# Patient Record
Sex: Female | Born: 1965 | Race: White | Hispanic: No | Marital: Married | Smoking: Never smoker
Health system: Southern US, Community
[De-identification: ages and names within clinical notes are randomized; demographics above are authoritative.]

## PROBLEM LIST (undated history)

## (undated) DIAGNOSIS — N952 Postmenopausal atrophic vaginitis: Secondary | ICD-10-CM

## (undated) DIAGNOSIS — K5190 Ulcerative colitis, unspecified, without complications: Secondary | ICD-10-CM

## (undated) DIAGNOSIS — M7732 Calcaneal spur, left foot: Secondary | ICD-10-CM

## (undated) DIAGNOSIS — R079 Chest pain, unspecified: Secondary | ICD-10-CM

## (undated) DIAGNOSIS — C50919 Malignant neoplasm of unspecified site of unspecified female breast: Secondary | ICD-10-CM

## (undated) DIAGNOSIS — F411 Generalized anxiety disorder: Secondary | ICD-10-CM

## (undated) DIAGNOSIS — F329 Major depressive disorder, single episode, unspecified: Secondary | ICD-10-CM

## (undated) DIAGNOSIS — E7800 Pure hypercholesterolemia, unspecified: Secondary | ICD-10-CM

## (undated) DIAGNOSIS — G43909 Migraine, unspecified, not intractable, without status migrainosus: Secondary | ICD-10-CM

## (undated) DIAGNOSIS — F32A Depression, unspecified: Secondary | ICD-10-CM

## (undated) DIAGNOSIS — F341 Dysthymic disorder: Secondary | ICD-10-CM

## (undated) DIAGNOSIS — G4700 Insomnia, unspecified: Secondary | ICD-10-CM

## (undated) DIAGNOSIS — M8580 Other specified disorders of bone density and structure, unspecified site: Secondary | ICD-10-CM

## (undated) DIAGNOSIS — F419 Anxiety disorder, unspecified: Secondary | ICD-10-CM

## (undated) DIAGNOSIS — N941 Unspecified dyspareunia: Secondary | ICD-10-CM

## (undated) DIAGNOSIS — Z923 Personal history of irradiation: Secondary | ICD-10-CM

## (undated) DIAGNOSIS — M7660 Achilles tendinitis, unspecified leg: Secondary | ICD-10-CM

## (undated) DIAGNOSIS — I959 Hypotension, unspecified: Secondary | ICD-10-CM

## (undated) HISTORY — PX: SINUS EXPLORATION: SHX5214

## (undated) HISTORY — DX: Calcaneal spur, left foot: M77.32

## (undated) HISTORY — DX: Insomnia, unspecified: G47.00

## (undated) HISTORY — DX: Other specified disorders of bone density and structure, unspecified site: M85.80

## (undated) HISTORY — DX: Postmenopausal atrophic vaginitis: N95.2

## (undated) HISTORY — DX: Chest pain, unspecified: R07.9

## (undated) HISTORY — DX: Major depressive disorder, single episode, unspecified: F32.9

## (undated) HISTORY — DX: Hypotension, unspecified: I95.9

## (undated) HISTORY — DX: Pure hypercholesterolemia, unspecified: E78.00

## (undated) HISTORY — DX: Malignant neoplasm of unspecified site of unspecified female breast: C50.919

## (undated) HISTORY — DX: Ulcerative colitis, unspecified, without complications: K51.90

## (undated) HISTORY — DX: Depression, unspecified: F32.A

## (undated) HISTORY — DX: Unspecified dyspareunia: N94.10

## (undated) HISTORY — DX: Migraine, unspecified, not intractable, without status migrainosus: G43.909

## (undated) HISTORY — DX: Generalized anxiety disorder: F41.1

## (undated) HISTORY — DX: Anxiety disorder, unspecified: F41.9

## (undated) HISTORY — DX: Achilles tendinitis, unspecified leg: M76.60

## (undated) HISTORY — DX: Dysthymic disorder: F34.1

## (undated) HISTORY — DX: Personal history of irradiation: Z92.3

---

## 1989-07-24 HISTORY — PX: NASAL SINUS SURGERY: SHX719

## 2006-05-15 ENCOUNTER — Other Ambulatory Visit: Admission: RE | Admit: 2006-05-15 | Discharge: 2006-05-15 | Payer: Self-pay | Admitting: Family Medicine

## 2006-10-03 ENCOUNTER — Encounter: Admission: RE | Admit: 2006-10-03 | Discharge: 2006-10-03 | Payer: Self-pay | Admitting: Family Medicine

## 2007-10-21 ENCOUNTER — Emergency Department (HOSPITAL_COMMUNITY): Admission: EM | Admit: 2007-10-21 | Discharge: 2007-10-21 | Payer: Self-pay | Admitting: Emergency Medicine

## 2008-08-30 ENCOUNTER — Encounter: Admission: RE | Admit: 2008-08-30 | Discharge: 2008-08-30 | Payer: Self-pay | Admitting: Family Medicine

## 2011-04-20 ENCOUNTER — Other Ambulatory Visit: Payer: Self-pay | Admitting: Radiology

## 2011-04-20 ENCOUNTER — Other Ambulatory Visit (HOSPITAL_COMMUNITY)
Admission: RE | Admit: 2011-04-20 | Discharge: 2011-04-20 | Disposition: A | Discharge status: Home / Self Care | Payer: BC Managed Care – PPO | Source: Ambulatory Visit | Attending: Obstetrics and Gynecology | Admitting: Obstetrics and Gynecology

## 2011-04-20 ENCOUNTER — Other Ambulatory Visit: Payer: Self-pay | Admitting: Obstetrics and Gynecology

## 2011-04-20 DIAGNOSIS — C50919 Malignant neoplasm of unspecified site of unspecified female breast: Secondary | ICD-10-CM

## 2011-04-20 DIAGNOSIS — Z01419 Encounter for gynecological examination (general) (routine) without abnormal findings: Secondary | ICD-10-CM | POA: Insufficient documentation

## 2011-04-20 HISTORY — DX: Malignant neoplasm of unspecified site of unspecified female breast: C50.919

## 2011-04-21 ENCOUNTER — Other Ambulatory Visit: Payer: Self-pay | Admitting: Radiology

## 2011-04-21 DIAGNOSIS — C50919 Malignant neoplasm of unspecified site of unspecified female breast: Secondary | ICD-10-CM

## 2011-04-21 DIAGNOSIS — C50912 Malignant neoplasm of unspecified site of left female breast: Secondary | ICD-10-CM

## 2011-04-24 HISTORY — PX: MASTECTOMY PARTIAL / LUMPECTOMY W/ AXILLARY LYMPHADENECTOMY: SUR852

## 2011-04-25 ENCOUNTER — Ambulatory Visit
Admission: RE | Admit: 2011-04-25 | Discharge: 2011-04-25 | Disposition: A | Discharge status: Home / Self Care | Payer: BC Managed Care – PPO | Source: Ambulatory Visit | Attending: Radiology | Admitting: Radiology

## 2011-04-25 DIAGNOSIS — C50912 Malignant neoplasm of unspecified site of left female breast: Secondary | ICD-10-CM

## 2011-04-25 MED ORDER — GADOBENATE DIMEGLUMINE 529 MG/ML IV SOLN
13.0000 mL | Freq: Once | INTRAVENOUS | Status: AC | PRN
  Administered 2011-04-25: 13 mL via INTRAVENOUS

## 2011-04-26 ENCOUNTER — Other Ambulatory Visit: Payer: Self-pay | Admitting: Oncology

## 2011-04-26 ENCOUNTER — Encounter (INDEPENDENT_AMBULATORY_CARE_PROVIDER_SITE_OTHER): Payer: Self-pay | Admitting: General Surgery

## 2011-04-26 ENCOUNTER — Ambulatory Visit (HOSPITAL_BASED_OUTPATIENT_CLINIC_OR_DEPARTMENT_OTHER): Payer: BC Managed Care – PPO | Admitting: General Surgery

## 2011-04-26 ENCOUNTER — Encounter (HOSPITAL_BASED_OUTPATIENT_CLINIC_OR_DEPARTMENT_OTHER): Payer: BC Managed Care – PPO | Admitting: Oncology

## 2011-04-26 ENCOUNTER — Ambulatory Visit: Payer: BC Managed Care – PPO | Attending: General Surgery | Admitting: Physical Therapy

## 2011-04-26 VITALS — BP 111/75 | HR 83 | Temp 97.9°F | Resp 20 | Ht 64.2 in | Wt 140.2 lb

## 2011-04-26 DIAGNOSIS — IMO0001 Reserved for inherently not codable concepts without codable children: Secondary | ICD-10-CM | POA: Insufficient documentation

## 2011-04-26 DIAGNOSIS — C50919 Malignant neoplasm of unspecified site of unspecified female breast: Secondary | ICD-10-CM | POA: Insufficient documentation

## 2011-04-26 DIAGNOSIS — M25619 Stiffness of unspecified shoulder, not elsewhere classified: Secondary | ICD-10-CM | POA: Insufficient documentation

## 2011-04-26 DIAGNOSIS — C50419 Malignant neoplasm of upper-outer quadrant of unspecified female breast: Secondary | ICD-10-CM

## 2011-04-26 DIAGNOSIS — C50412 Malignant neoplasm of upper-outer quadrant of left female breast: Secondary | ICD-10-CM | POA: Insufficient documentation

## 2011-04-26 LAB — CBC WITH DIFFERENTIAL/PLATELET
BASO%: 0.6 % (ref 0.0–2.0)
EOS%: 1 % (ref 0.0–7.0)
HCT: 34.3 % — ABNORMAL LOW (ref 34.8–46.6)
LYMPH%: 33 % (ref 14.0–49.7)
MCH: 25.1 pg (ref 25.1–34.0)
MCHC: 32.5 g/dL (ref 31.5–36.0)
MONO#: 0.7 10*3/uL (ref 0.1–0.9)
NEUT%: 56.3 % (ref 38.4–76.8)
Platelets: 266 10*3/uL (ref 145–400)
RBC: 4.43 10*6/uL (ref 3.70–5.45)
WBC: 7.2 10*3/uL (ref 3.9–10.3)

## 2011-04-26 LAB — COMPREHENSIVE METABOLIC PANEL
ALT: 11 U/L (ref 0–35)
AST: 16 U/L (ref 0–37)
Alkaline Phosphatase: 40 U/L (ref 39–117)
CO2: 29 mEq/L (ref 19–32)
Creatinine, Ser: 0.55 mg/dL (ref 0.50–1.10)
Sodium: 138 mEq/L (ref 135–145)
Total Bilirubin: 0.2 mg/dL — ABNORMAL LOW (ref 0.3–1.2)
Total Protein: 7.3 g/dL (ref 6.0–8.3)

## 2011-04-26 NOTE — Progress Notes (Signed)
Subjective:   New diagnosis of breast cancer  Patient ID: Erin Castillo, female   DOB: [birth year], 44 y.o.   MRN: [id number]  HPI The patient is a 45 year old female seen in the breast multidisciplinary clinic for a new diagnosis of cancer of the left breast. She states that about 2 months ago she initially noted a slight puckering of the skin in the upper outer quadrant of her left breast. She initially did not think much of this more recently has also been able to feel a lump and presented for evaluation. She was evaluated at [facility]. I have personally reviewed her imaging and pathology reports. Mammogram revealed an approximately 2 cm spiculated mass in the upper outer quadrant of the left breast in the tail of Spence area. Ultrasound confirmed a hypoechoic 2.1 cm mass in the same area. Ultrasound-guided needle biopsy was performed. 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 She generally has felt well with no pain, nipple discharge, or other symptoms. She presents for treatment planning.  History reviewed. No pertinent past medical history. Past Surgical History  Procedure Date  . Nasal sinus surgery 1991  . Cesarean section    Current Outpatient Prescriptions  Medication Sig Dispense Refill  . citalopram (CELEXA) 20 MG tablet Take 20 mg by mouth daily.         No Known Allergies History  Substance Use Topics  . Smoking status: Never Smoker   . Smokeless tobacco: Not on file  . Alcohol Use: 0.6 oz/week    1 Glasses of wine per week    Review of Systems  Constitutional: Negative.   HENT: Negative.   Respiratory: Negative.   Cardiovascular: Negative.   Gastrointestinal: Negative.   Musculoskeletal: Negative.        Objective:   Physical Exam Gen.: Healthy-appearing Caucasian female in no distress Skin: Warm and dry without rash or infection HEENT: No palpable masses or thyromegaly. Sclera nonicteric. Oropharynx  clear. Lungs: Clear without wheezing or increased work of breathing Lymph nodes: No palpable cervical, supraclavicular, or axillary nodes palpable. Breasts: In the upper outer quadrant of the left breast in the tail of Mliss Sax is an approximately 2 cm moderately firm freely movable mass. There is no sensation of fixation to the chest wall. There is some slight puckering of the overlying skin. Cardiovascular: Regular rate and rhythm without murmur. No JVD or edema. Abdomen: Soft and nontender without mass Extremities: No joint swelling or deformity or edema Neurologic: Alert and fully oriented. Gait normal.    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 This is in the extreme upper outer quadrant in the tail of Spence. There is no fixation to the chest wall it appears to be separate from the chest wall on imaging. We discussed options of mastectomy and breast conservation and she clearly prefers breast conservation if appropriate. I believe we could proceed with initial lumpectomy. It does appear to be close to the skin and I would plan to have explained to the patient but would take an ellipse of skin overlying the tumor and then excised down to the chest wall. We will plan axillary sentinel lymph node biopsy as well. We discussed the indications and nature of the surgery and expected recovery and risks of bleeding, infection, possible need for further surgery based on final pathology, anesthetic complications, and rare risks of lymphedema.she will be  undergoing genetic testing. She will need radiation and probably hormonal treatment postoperatively.    Plan:     Left breast lumpectomy and left axillary sentinel lymph node biopsy under general anesthesia as an outpatient. We will want to wait until her genetic test results are back which will put her surgery after about October 20.

## 2011-04-26 NOTE — Patient Instructions (Signed)
Please call for any questions or concerns prior to her surgery. Please call my office when you have results of her genetic testing.

## 2011-04-27 ENCOUNTER — Other Ambulatory Visit (INDEPENDENT_AMBULATORY_CARE_PROVIDER_SITE_OTHER): Payer: Self-pay | Admitting: General Surgery

## 2011-04-27 DIAGNOSIS — C50912 Malignant neoplasm of unspecified site of left female breast: Secondary | ICD-10-CM

## 2011-05-15 ENCOUNTER — Ambulatory Visit (HOSPITAL_BASED_OUTPATIENT_CLINIC_OR_DEPARTMENT_OTHER)
Admission: RE | Admit: 2011-05-15 | Discharge: 2011-05-15 | Disposition: A | Discharge status: Home / Self Care | Payer: BC Managed Care – PPO | Source: Ambulatory Visit | Attending: General Surgery | Admitting: General Surgery

## 2011-05-15 ENCOUNTER — Other Ambulatory Visit (INDEPENDENT_AMBULATORY_CARE_PROVIDER_SITE_OTHER): Payer: Self-pay | Admitting: General Surgery

## 2011-05-15 ENCOUNTER — Ambulatory Visit (HOSPITAL_COMMUNITY)
Admission: RE | Admit: 2011-05-15 | Discharge: 2011-05-15 | Disposition: A | Discharge status: Home / Self Care | Payer: BC Managed Care – PPO | Source: Ambulatory Visit | Attending: General Surgery | Admitting: General Surgery

## 2011-05-15 DIAGNOSIS — C50419 Malignant neoplasm of upper-outer quadrant of unspecified female breast: Secondary | ICD-10-CM | POA: Insufficient documentation

## 2011-05-15 DIAGNOSIS — C50919 Malignant neoplasm of unspecified site of unspecified female breast: Secondary | ICD-10-CM | POA: Insufficient documentation

## 2011-05-15 DIAGNOSIS — Z01812 Encounter for preprocedural laboratory examination: Secondary | ICD-10-CM | POA: Insufficient documentation

## 2011-05-15 DIAGNOSIS — C50912 Malignant neoplasm of unspecified site of left female breast: Secondary | ICD-10-CM

## 2011-05-15 DIAGNOSIS — C773 Secondary and unspecified malignant neoplasm of axilla and upper limb lymph nodes: Secondary | ICD-10-CM | POA: Insufficient documentation

## 2011-05-15 HISTORY — PX: BREAST LUMPECTOMY: SHX2

## 2011-05-15 MED ORDER — TECHNETIUM TC 99M SULFUR COLLOID FILTERED
1.0000 | Freq: Once | INTRAVENOUS | Status: AC | PRN
  Administered 2011-05-15: 1 via INTRADERMAL

## 2011-05-17 ENCOUNTER — Telehealth (INDEPENDENT_AMBULATORY_CARE_PROVIDER_SITE_OTHER): Payer: Self-pay | Admitting: General Surgery

## 2011-05-17 NOTE — Op Note (Signed)
[name], [name] NO.:  [id number]  MEDICAL RECORD NO.:  [id number]  LOCATION:                                 FACILITY:  PHYSICIAN:  Sharlet Salina T. Marinda Tyer, M.D.DATE OF BIRTH:  04/29/1966  DATE OF PROCEDURE:  05/15/2011 DATE OF DISCHARGE:                              OPERATIVE REPORT   PREOPERATIVE DIAGNOSIS:  Cancer left breast.  POSTOPERATIVE DIAGNOSIS:  Cancer left breast.  SURGICAL PROCEDURES: 1. Blue dye injection left breast. 2. Left breast lumpectomy. 3. Left axillary sentinel lymph node biopsy.  SURGEON:  Lorne Skeens. Larson Limones, MD  ANESTHESIA:  Laryngeal mask general.  BRIEF HISTORY:  Erin Castillo is a 45 year old female who recently presented with a palpable mass in the extreme upper outer quadrant left breast in the tail of Spence.  Imaging including MRI and ultrasound has revealed an approximately 2.1 cm tumor close to, but not involving the skin or the pectoralis fascia.  Large core needle biopsies revealed invasive grade II ductal carcinoma.  We discussed initial surgical treatment options and I have elected to proceed with breast conservation with a lumpectomy and sentinel lymph node biopsy.  We discussed the nature of the procedure, its indications, risks of anesthetic complications, bleeding, infection, possible need for further surgery depending on final pathology.  She is now brought to the operating room for this procedure.  DESCRIPTION OF PROCEDURE:  Preoperatively, the patient underwent injection of 1 millicuries of technetium sulfur colloid intradermally around the left nipple.  She was then brought to the operating room and laryngeal mask general anesthesia was induced.  Under sterile technique, 10 mL of dilute methylene blue was injected subcutaneously beneath the left nipple and massaged.  The entire left breast, chest, axilla and upper arm were then widely sterilely prepped and draped.  Correct patient and procedure were  verified.  PAS were in place.  She received preoperative IV antibiotics.  The tumor again was close to, but not involving skin, although there was some slight puckering and was within a few millimeters of the pectoralis fascia.  As discussed and planned I made a curvilinear incision directly over the mass taking approximately 7 or 8 mm ellipse of skin directly overlying the most superficial area of the mass.  Dissection was then deepened down into the subcutaneous tissue and the wound edges retracted and dissection was deepened down around the mass in all directions through breast tissue with cautery, and this was taken down to the pectoralis fascia immediately beneath the mass which was taken up off the pectoralis with the specimen.  The palpable mass appeared to be well within the specimen grossly.  The specimen was inked for margins.  Specimen mammogram was obtained in the operating room, showing the clip in the center of the specimen.  This was sent for permanent pathology.  As this incision was high and lateral we were able to retract the skin laterally and exposed the low axilla and using the Neoprobe, a definite hot area in the axilla was identified.  With minimal dissection down into the axilla a bright blue lymph node with high counts was identified and was completely excised with cautery.  Ex  Vivo, the node had counts in excess of 1200 with background in the axilla of about 10.  This was sent as a hot blue left axillary sentinel lymph node.  Following this, all soft tissue was infiltrated with Marcaine with epinephrine.  I did mobilize the inferior breast tissue off the chest wall back about 5 cm with cautery to allow at least partial closure of the defect.  The cavity was marked with clips for radiation oncology.  The breast tissue was then closed with interrupted 3-0 Vicryl . At the center of the wound the breast tissue was brought up and sewn down to the pectoralis muscle  superiorly to smooth out and minimize the defect.  The subcu tissue was then closed with interrupted 3-0 Vicryl and skin with subcuticular 4-0 Monocryl, benzoin, and Steri-Strips.  Sponge, needle and instrument counts correct.  Dry dressing was applied.  The patient taken to recovery in good condition.     Lorne Skeens. Keirsten Matuska, M.D.     Tory Emerald  D:  05/15/2011  T:  05/15/2011  Job:  [id number]  Electronically Signed by Glenna Fellows M.D. on 05/17/2011 10:53:29 AM

## 2011-05-23 ENCOUNTER — Ambulatory Visit
Admission: RE | Admit: 2011-05-23 | Discharge: 2011-05-23 | Disposition: A | Discharge status: Home / Self Care | Payer: BC Managed Care – PPO | Source: Ambulatory Visit | Attending: Radiation Oncology | Admitting: Radiation Oncology

## 2011-05-23 DIAGNOSIS — C50919 Malignant neoplasm of unspecified site of unspecified female breast: Secondary | ICD-10-CM | POA: Insufficient documentation

## 2011-05-23 NOTE — Telephone Encounter (Signed)
Discussed path with pt

## 2011-05-25 ENCOUNTER — Ambulatory Visit (INDEPENDENT_AMBULATORY_CARE_PROVIDER_SITE_OTHER): Payer: BC Managed Care – PPO | Admitting: General Surgery

## 2011-05-25 ENCOUNTER — Encounter (INDEPENDENT_AMBULATORY_CARE_PROVIDER_SITE_OTHER): Payer: Self-pay | Admitting: General Surgery

## 2011-05-25 VITALS — BP 102/78 | HR 60 | Temp 98.2°F | Resp 20 | Ht 65.0 in | Wt 142.2 lb

## 2011-05-25 DIAGNOSIS — Z09 Encounter for follow-up examination after completed treatment for conditions other than malignant neoplasm: Secondary | ICD-10-CM

## 2011-05-25 NOTE — Progress Notes (Signed)
Patient returns for postop followup status post left breast lumpectomy and axillary sentinel lymph node biopsy. She is given along well with some expected soreness and fatigue.  On exam the lumpectomy site is healing very nicely as is the axillary incision without complication.  We reviewed her pathology which we had previously discussed by phone. She has negative margins on her lumpectomy. This was a 2.4 cm tumor. The closest margin was posterior and I did take the fascia over the muscle as this margin. Her sentinel lymph node however was positive for metastatic cancer with extracapsular extension.  Patient is doing well following lumpectomy and sentinel node biopsy. She has an appointment to see Dr. Darnelle Catalan on the 13th of this month. She may need a Port-A-Cath will be available and otherwise she will return in 4 months.

## 2011-05-29 ENCOUNTER — Encounter: Payer: Self-pay | Admitting: Oncology

## 2011-05-29 NOTE — Progress Notes (Unsigned)
Received Oncotype Dx results of 17.  Gave copy to MD.

## 2011-06-06 ENCOUNTER — Other Ambulatory Visit (HOSPITAL_BASED_OUTPATIENT_CLINIC_OR_DEPARTMENT_OTHER): Payer: BC Managed Care – PPO | Admitting: Lab

## 2011-06-06 ENCOUNTER — Encounter: Payer: Self-pay | Admitting: Oncology

## 2011-06-06 ENCOUNTER — Other Ambulatory Visit: Payer: Self-pay | Admitting: Oncology

## 2011-06-06 ENCOUNTER — Ambulatory Visit (HOSPITAL_BASED_OUTPATIENT_CLINIC_OR_DEPARTMENT_OTHER): Payer: BC Managed Care – PPO | Admitting: Oncology

## 2011-06-06 VITALS — BP 115/71 | HR 73 | Temp 98.0°F | Ht 65.0 in | Wt 147.4 lb

## 2011-06-06 DIAGNOSIS — C50419 Malignant neoplasm of upper-outer quadrant of unspecified female breast: Secondary | ICD-10-CM

## 2011-06-06 DIAGNOSIS — Z170 Estrogen receptor positive status [ER+]: Secondary | ICD-10-CM

## 2011-06-06 DIAGNOSIS — C50919 Malignant neoplasm of unspecified site of unspecified female breast: Secondary | ICD-10-CM

## 2011-06-06 LAB — CBC WITH DIFFERENTIAL/PLATELET
Basophils Absolute: 0 10*3/uL (ref 0.0–0.1)
EOS%: 2.3 % (ref 0.0–7.0)
Eosinophils Absolute: 0.2 10*3/uL (ref 0.0–0.5)
HCT: 33.5 % — ABNORMAL LOW (ref 34.8–46.6)
HGB: 10.8 g/dL — ABNORMAL LOW (ref 11.6–15.9)
LYMPH%: 28.9 % (ref 14.0–49.7)
MCH: 24.9 pg — ABNORMAL LOW (ref 25.1–34.0)
MCV: 77.4 fL — ABNORMAL LOW (ref 79.5–101.0)
MONO%: 9 % (ref 0.0–14.0)
NEUT#: 4.8 10*3/uL (ref 1.5–6.5)
NEUT%: 59.7 % (ref 38.4–76.8)
Platelets: 305 10*3/uL (ref 145–400)
RDW: 17.4 % — ABNORMAL HIGH (ref 11.2–14.5)

## 2011-06-06 NOTE — Progress Notes (Signed)
Erin Castillo is an 45 y.o. female.    Chief Complaint  Patient presents with  . Breast Cancer    HPI:  The patie after bilateral breast MRIs 04/25/2011 confirmed a 2.3 cm irregularly marginated enhancing mass deep in the upper outer quadrant of the left breast the patient proceeded to definitive left lumpectomy and sentinel lymph node biopsy 05/15/2011. The final pathology (as is he a [id number]) and nt noted a slight dimple in her lateral left breast sometime in September of 2012 and was able to palpate a mass in that area. She brought this to the attention of Dr. Dion Castillo, and he set the patient up for bilateral diagnostic mammography at [facility] 04/20/2011. Compression views and standard CC and MLO views confirmed a spiculated mass in the upper outer quadrant of the left breast measuring 2.1 cm. By ultrasound this was lobulated irregular and hypoechoic. The axilla it revealed no abnormal appearing lymph nodes. Bx was performed the same day and showed (SA a [id number]) and invasive ductal carcinoma described as grade 2, estrogen receptor 98% and progesterone receptor 100% positive, with an MIB-1-1 of 9% and no HDR 2 amplification  With this information the patient was referred to Dr. Johna Castillo and on 05/15/2011 underwent left lumpectomy and sentinel lymph node sampling. The final pathology (SZ a [id number]) showed a 2.4 cm invasive ductal carcinoma, grade 2, with negative and adequate margins, no evidence of lymphovascular invasion, but one out of one sentinel lymph nodes involved. With this information the patient now presents for discussion of adjuvant treatment.  Past Medical History  Diagnosis Date  . Cancer     breast   . Migraines   . Anxiety     Past Surgical History  Procedure Date  . Nasal sinus surgery 1991  . Cesarean section 2000  . Breast lumpectomy 05/15/11    left breast lumpectomy   . Sinus exploration     remote    Family History  Problem Relation Age of Onset  . Cancer Father    the patient's father has a history of bladder cancer but has done well and is alive at age 56 the patient's mother is alive at age 58 the patient has 3 sisters no brothers there is no history of breast or ovarian cancer in the family.  Gynecologic history:  Menarche age 1 regular periods last about 4 days at which about 2 or heavy. 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 Her friend Erin Castillo who actually had to the board for the [employer] is present today. The patient is separated from her husband, Erin Castillo, who works at [facility]. They are not officially divorced however. Her children are Erin Castillo. [address] [place] age 56 and Erin Castillo age 44. The patient attends [facility]  She  reports that she has never smoked. She has never used smokeless tobacco. She reports that she drinks perhaps 2 glasses of wine a week.. She reports that she does not use illicit drugs.  Health maintenance:  Cholesterol "good"  Bone density never  Colonoscopy approximately 10 years ago in [place]  (PAP) September of 2012   Allergies: No Known Allergies  Medications Prior to Admission  Medication Sig Dispense Refill  . citalopram (CELEXA) 20 MG tablet Take 20 mg by mouth daily.         No current facility-administered medications on file as of 06/06/2011.  ROS  she did well with her surgery with minimal pain no bleeding no swelling no erythema no fever. Currently a detailed review of systems is entirely noncontributory  Physical Exam:  Blood pressure 115/71, pulse 73, temperature 98 F (36.7 Castillo), height 5\' 5"  (1.651 m), weight 147 lb 6.4 oz (66.86 kg).  Sclerae unicteric Oropharynx clear No peripheral adenopathy Lungs no rales or rhonchi Heart regular rate and rhythm Abd benign MSK no focal spinal tenderness, no peripheral edema Neuro: nonfocal Breasts: Right breast no suspicious  findings left breast status post lumpectomy excellent cosmetic result no evidence of dehiscence inflammation swelling or erythema  CBC Lab Results  Component Value Date   HGB 10.8* 06/06/2011   HCT 33.5* 06/06/2011   MCV 77.4* 06/06/2011   PLT 305 06/06/2011   CMP     Component Value Date/Time   NA 138 04/26/2011 1219   K 3.6 04/26/2011 1219   CL 101 04/26/2011 1219   CO2 29 04/26/2011 1219   GLUCOSE 107* 04/26/2011 1219   BUN 12 04/26/2011 1219   CREATININE 0.55 04/26/2011 1219   CALCIUM 9.2 04/26/2011 1219   PROT 7.3 04/26/2011 1219   ALBUMIN 3.9 04/26/2011 1219   AST 16 04/26/2011 1219   ALT 11 04/26/2011 1219   ALKPHOS 40 04/26/2011 1219   BILITOT 0.2* 04/26/2011 1219     No new results found.   Assessment: 45 year old [place] woman status post left lumpectomy and sentinel lymph node biopsy October of 2012 for a 2.4 cm invasive ductal carcinoma, grade 2, involving the single sentinel lymph node, and so T2 N1 or stage IIb. The tumor was grade 2, strongly estrogen and progesterone receptor positive, with an MIB-1-1 of 9% and no HDR to amplification.  Plan:  I quoted her a risk of recurrence in the 57% range based on the adjuvant! Program. She can decrease this by 40% by taking standard anti-estrogen and chemotherapy treatments. After much discussion we decided we would go with TAC (cyclophosphamide doxorubicin and docetaxel) given every 3 weeks via port and that we would start treatment on November 29. We discussed side effects toxicities and complications of treatment and she will also come to "chemotherapy is cool" for further discussion  I have requested a return appointment with Dr. Johna Castillo for port placement, and she will also be scheduled for a chest x-ray and an echocardiogram. She'll see [place] again on November 26 and at that visit we will review results of these studies and also right of her prescriptions for antinausea and other prophylactic medicines and give her written  instructions on how to take them at this point it Erin Castillo is considering continuing to work but she may choose to go on temporary disability  Erin Castillo,Erin Castillo 06/06/2011, 6:07 PM

## 2011-06-08 ENCOUNTER — Other Ambulatory Visit (INDEPENDENT_AMBULATORY_CARE_PROVIDER_SITE_OTHER): Payer: Self-pay | Admitting: General Surgery

## 2011-06-08 ENCOUNTER — Encounter (HOSPITAL_BASED_OUTPATIENT_CLINIC_OR_DEPARTMENT_OTHER): Payer: Self-pay | Admitting: *Deleted

## 2011-06-08 NOTE — Progress Notes (Signed)
Labs per anesthesia Does not meet criteria for ekg or cxr Will get cxr post pac insertion All preop teaching reviewed

## 2011-06-12 ENCOUNTER — Ambulatory Visit (HOSPITAL_COMMUNITY): Payer: BC Managed Care – PPO

## 2011-06-12 ENCOUNTER — Encounter (HOSPITAL_BASED_OUTPATIENT_CLINIC_OR_DEPARTMENT_OTHER): Payer: Self-pay | Admitting: Certified Registered"

## 2011-06-12 ENCOUNTER — Ambulatory Visit (HOSPITAL_BASED_OUTPATIENT_CLINIC_OR_DEPARTMENT_OTHER)
Admission: RE | Admit: 2011-06-12 | Discharge: 2011-06-12 | Disposition: A | Discharge status: Home / Self Care | Payer: BC Managed Care – PPO | Source: Ambulatory Visit | Attending: General Surgery | Admitting: General Surgery

## 2011-06-12 ENCOUNTER — Encounter (HOSPITAL_BASED_OUTPATIENT_CLINIC_OR_DEPARTMENT_OTHER)
Admission: RE | Disposition: A | Discharge status: Home / Self Care | Payer: Self-pay | Source: Ambulatory Visit | Attending: General Surgery

## 2011-06-12 ENCOUNTER — Encounter (HOSPITAL_BASED_OUTPATIENT_CLINIC_OR_DEPARTMENT_OTHER): Payer: Self-pay

## 2011-06-12 ENCOUNTER — Ambulatory Visit (HOSPITAL_BASED_OUTPATIENT_CLINIC_OR_DEPARTMENT_OTHER): Payer: BC Managed Care – PPO | Admitting: Certified Registered"

## 2011-06-12 DIAGNOSIS — Z01812 Encounter for preprocedural laboratory examination: Secondary | ICD-10-CM | POA: Insufficient documentation

## 2011-06-12 DIAGNOSIS — C50919 Malignant neoplasm of unspecified site of unspecified female breast: Secondary | ICD-10-CM | POA: Insufficient documentation

## 2011-06-12 DIAGNOSIS — C50419 Malignant neoplasm of upper-outer quadrant of unspecified female breast: Secondary | ICD-10-CM

## 2011-06-12 HISTORY — PX: PORTACATH PLACEMENT: SHX2246

## 2011-06-12 SURGERY — INSERTION, TUNNELED CENTRAL VENOUS DEVICE, WITH PORT
Anesthesia: Monitor Anesthesia Care | Site: Chest | Laterality: Right | Wound class: Clean

## 2011-06-12 MED ORDER — ONDANSETRON HCL 4 MG/2ML IJ SOLN
INTRAMUSCULAR | Status: DC | PRN
  Administered 2011-06-12: 4 mg via INTRAVENOUS

## 2011-06-12 MED ORDER — HEPARIN (PORCINE) IN NACL 2-0.9 UNIT/ML-% IJ SOLN
INTRAMUSCULAR | Status: DC | PRN
  Administered 2011-06-12: 50 mL via INTRAVENOUS

## 2011-06-12 MED ORDER — HYDROMORPHONE HCL PF 1 MG/ML IJ SOLN: 0.2500 mg | INTRAMUSCULAR | Status: DC | PRN

## 2011-06-12 MED ORDER — HEPARIN SOD (PORK) LOCK FLUSH 100 UNIT/ML IV SOLN
INTRAVENOUS | Status: DC | PRN
  Administered 2011-06-12: 500 [IU] via INTRAVENOUS

## 2011-06-12 MED ORDER — FENTANYL CITRATE 0.05 MG/ML IJ SOLN
INTRAMUSCULAR | Status: DC | PRN
  Administered 2011-06-12: 100 ug via INTRAVENOUS

## 2011-06-12 MED ORDER — SODIUM BICARBONATE 4 % IV SOLN
INTRAVENOUS | Status: DC | PRN
  Administered 2011-06-12: 2 mL via INTRAVENOUS

## 2011-06-12 MED ORDER — LACTATED RINGERS IV SOLN
INTRAVENOUS | Status: DC
  Administered 2011-06-12 (×2): via INTRAVENOUS

## 2011-06-12 MED ORDER — LIDOCAINE HCL (CARDIAC) 20 MG/ML IV SOLN
INTRAVENOUS | Status: DC | PRN
  Administered 2011-06-12: 20 mg via INTRAVENOUS

## 2011-06-12 MED ORDER — PROMETHAZINE HCL 25 MG/ML IJ SOLN: 6.2500 mg | INTRAMUSCULAR | Status: DC | PRN

## 2011-06-12 MED ORDER — MIDAZOLAM HCL 5 MG/5ML IJ SOLN
INTRAMUSCULAR | Status: DC | PRN
  Administered 2011-06-12: 2 mg via INTRAVENOUS
  Administered 2011-06-12: 1 mg via INTRAVENOUS
  Administered 2011-06-12: 0.5 mg via INTRAVENOUS

## 2011-06-12 MED ORDER — CEFAZOLIN SODIUM 1-5 GM-% IV SOLN
1.0000 g | INTRAVENOUS | Status: AC
  Administered 2011-06-12: 1 g via INTRAVENOUS

## 2011-06-12 MED ORDER — BUPIVACAINE-EPINEPHRINE 0.25% -1:200000 IJ SOLN
INTRAMUSCULAR | Status: DC | PRN
  Administered 2011-06-12: 10 mL

## 2011-06-12 MED ORDER — PROPOFOL 10 MG/ML IV EMUL
INTRAVENOUS | Status: DC | PRN
  Administered 2011-06-12: 100 ug/kg/min via INTRAVENOUS

## 2011-06-12 MED ORDER — LIDOCAINE HCL (PF) 1 % IJ SOLN
INTRAMUSCULAR | Status: DC | PRN
  Administered 2011-06-12: 10 mL

## 2011-06-12 SURGICAL SUPPLY — 46 items
ADH SKN CLS APL DERMABOND .7 (GAUZE/BANDAGES/DRESSINGS) ×1
BAG DECANTER FOR FLEXI CONT (MISCELLANEOUS) ×2 IMPLANT
BANDAGE ADHESIVE 1X3 (GAUZE/BANDAGES/DRESSINGS) ×1 IMPLANT
BLADE SURG 15 STRL LF DISP TIS (BLADE) ×1 IMPLANT
BLADE SURG 15 STRL SS (BLADE) ×2
CHLORAPREP W/TINT 26ML (MISCELLANEOUS) ×2 IMPLANT
CLOTH BEACON ORANGE TIMEOUT ST (SAFETY) ×2 IMPLANT
COVER MAYO STAND STRL (DRAPES) ×2 IMPLANT
COVER TABLE BACK 60X90 (DRAPES) ×2 IMPLANT
DECANTER SPIKE VIAL GLASS SM (MISCELLANEOUS) IMPLANT
DERMABOND ADVANCED (GAUZE/BANDAGES/DRESSINGS) ×1
DERMABOND ADVANCED .7 DNX12 (GAUZE/BANDAGES/DRESSINGS) ×1 IMPLANT
DRAPE C-ARM 42X72 X-RAY (DRAPES) ×2 IMPLANT
DRAPE LAPAROTOMY T 102X78X121 (DRAPES) ×2 IMPLANT
DRAPE UTILITY XL STRL (DRAPES) ×2 IMPLANT
ELECT REM PT RETURN 9FT ADLT (ELECTROSURGICAL) ×2
ELECTRODE REM PT RTRN 9FT ADLT (ELECTROSURGICAL) ×1 IMPLANT
GLOVE BIO SURGEON STRL SZ 6.5 (GLOVE) ×1 IMPLANT
GLOVE BIOGEL PI IND STRL 7.0 (GLOVE) IMPLANT
GLOVE BIOGEL PI IND STRL 8 (GLOVE) ×1 IMPLANT
GLOVE BIOGEL PI INDICATOR 7.0 (GLOVE) ×1
GLOVE BIOGEL PI INDICATOR 8 (GLOVE) ×1
GLOVE SS BIOGEL STRL SZ 7.5 (GLOVE) ×1 IMPLANT
GLOVE SUPERSENSE BIOGEL SZ 7.5 (GLOVE) ×1
GOWN PREVENTION PLUS XLARGE (GOWN DISPOSABLE) ×2 IMPLANT
IV CATH PLACEMENT UNIT 16 GA (IV SOLUTION) IMPLANT
IV HEPARIN 1000UNITS/500ML (IV SOLUTION) ×2 IMPLANT
IV KIT MINILOC 20X1 SAFETY (NEEDLE) IMPLANT
KIT BARDPORT ISP (Port) IMPLANT
KIT POWER CATH 8FR (Catheter) ×1 IMPLANT
NDL HYPO 25X1 1.5 SAFETY (NEEDLE) ×1 IMPLANT
NEEDLE HYPO 22GX1.5 SAFETY (NEEDLE) ×2 IMPLANT
NEEDLE HYPO 25X1 1.5 SAFETY (NEEDLE) ×2 IMPLANT
PACK BASIN DAY SURGERY FS (CUSTOM PROCEDURE TRAY) ×2 IMPLANT
PENCIL BUTTON HOLSTER BLD 10FT (ELECTRODE) ×2 IMPLANT
SET SHEATH INTRODUCER 10FR (MISCELLANEOUS) IMPLANT
SHEATH COOK PEEL AWAY SET 9F (SHEATH) IMPLANT
SLEEVE SCD COMPRESS KNEE MED (MISCELLANEOUS) ×1 IMPLANT
SUT MON AB 4-0 PC3 18 (SUTURE) ×2 IMPLANT
SUT PROLENE 2 0 CT2 30 (SUTURE) ×2 IMPLANT
SUT SILK 4 0 TIES 17X18 (SUTURE) IMPLANT
SYR 5ML LUER SLIP (SYRINGE) ×1 IMPLANT
SYR CONTROL 10ML LL (SYRINGE) ×2 IMPLANT
TOWEL OR 17X24 6PK STRL BLUE (TOWEL DISPOSABLE) ×14 IMPLANT
TOWEL OR NON WOVEN STRL DISP B (DISPOSABLE) ×2 IMPLANT
WATER STERILE IRR 1000ML POUR (IV SOLUTION) ×1 IMPLANT

## 2011-06-12 NOTE — Anesthesia Preprocedure Evaluation (Addendum)
Anesthesia Evaluation  Patient identified by MRN, date of birth, ID band Patient awake    Airway Mallampati: I TM Distance: >3 FB Neck ROM: Full    Dental No notable dental hx. (+) Teeth Intact   Pulmonary neg pulmonary ROS,  clear to auscultation  Pulmonary exam normal       Cardiovascular neg cardio ROS Regular Normal    Neuro/Psych    GI/Hepatic negative GI ROS, Neg liver ROS,   Endo/Other  Negative Endocrine ROS  Renal/GU negative Renal ROS     Musculoskeletal negative musculoskeletal ROS (+)   Abdominal   Peds  Hematology   Anesthesia Other Findings   Reproductive/Obstetrics negative OB ROS                          Anesthesia Physical Anesthesia Plan  ASA: III  Anesthesia Plan: MAC   Post-op Pain Management:    Induction: Intravenous  Airway Management Planned: Simple Face Mask  Additional Equipment:   Intra-op Plan:   Post-operative Plan:   Informed Consent: I have reviewed the patients History and Physical, chart, labs and discussed the procedure including the risks, benefits and alternatives for the proposed anesthesia with the patient or authorized representative who has indicated his/her understanding and acceptance.   Dental advisory given  Plan Discussed with: CRNA and Surgeon  Anesthesia Plan Comments: (Plan routine monitors, MAC)        Anesthesia Quick Evaluation

## 2011-06-12 NOTE — Anesthesia Postprocedure Evaluation (Signed)
  Anesthesia Post-op Note  Patient: Erin Castillo  Procedure(s) Performed:  INSERTION PORT-A-CATH - Right Subclavian Vein  Patient Location: PACU  Anesthesia Type: MAC  Level of Consciousness: awake and alert   Airway and Oxygen Therapy: Patient Spontanous Breathing  Post-op Pain: none  Post-op Assessment: Post-op Vital signs reviewed, Patient's Cardiovascular Status Stable, Respiratory Function Stable, No signs of Nausea or vomiting, Adequate PO intake and Pain level controlled  Post-op Vital Signs: Reviewed and stable  Complications: No apparent anesthesia complications

## 2011-06-12 NOTE — Transfer of Care (Signed)
Immediate Anesthesia Transfer of Care Note  Patient: Erin Castillo  Procedure(s) Performed:  INSERTION PORT-A-CATH - Right Subclavian Vein  Patient Location: PACU  Anesthesia Type: MAC  Level of Consciousness: awake, alert , oriented and patient cooperative  Airway & Oxygen Therapy: Patient Spontanous Breathing  Post-op Assessment: Report given to PACU RN and Post -op Vital signs reviewed and stable  Post vital signs: Reviewed and stable  Complications: No apparent anesthesia complications

## 2011-06-12 NOTE — H&P (Signed)
  Subjective: For Portacath    Erin Castillo is a 45 y.o. female who presents for portacath placement, hx of CA breast needing chemorx    Objective:   BP 99/62  Pulse 76  Temp(Src) 98.2 F (36.8 C) (Oral)  Resp 20  SpO2 100%  General:  alert, cooperative and no distress Skin:  normal Eyes: negative, conjunctivae/corneas clear. PERRL, EOM's intact. Fundi benign. Mouth: MMM no lesions Lymph Nodes:  Cervical, supraclavicular, and axillary nodes normal. Lungs:  clear to auscultation bilaterally Heart:  regular rate and rhythm, S1, S2 normal, no murmur, click, rub or gallop Abdomen: soft, non-tender; bowel sounds normal; no masses,  no organomegaly  Extremities:  extremities normal, atraumatic, no cyanosis or edema Neurologic:  Alert and oriented x3. Gait normal.Psychiatric:  normal mood, behavior, speech, dress, and thought processes    Assessment:For portacath    Plan:   1. Discussed the risk of surgery,  and the risks of general anesthetic, bleeding, infection, PTX, throbosis and catheter displacement. The patient understands the risks, any and all questions were answered to the patient's satisfaction.   Date of Surgery Update (To be completed by Attending Surgeon day of surgery.)

## 2011-06-12 NOTE — Anesthesia Procedure Notes (Addendum)
Performed by: Radford Pax   Procedure Name: MAC Date/Time: 06/12/2011 10:14 AM Performed by: Radford Pax Pre-anesthesia Checklist: Patient identified, Timeout performed, Emergency Drugs available, Suction available and Patient being monitored Patient Re-evaluated:Patient Re-evaluated prior to inductionOxygen Delivery Method: Simple face mask Intubation Type: IV induction

## 2011-06-12 NOTE — Op Note (Signed)
Preoperative diagnosis: Cancer of the breast and the poor venous access  Postoperative diagnosis: Same  Procedure: Placement of Powerport subcutaneous venous port  Surgeon: Glenna Fellows M.D.  Anesthesia: Local with IV sedation  Description of procedure: Patient is brought to the operating room and placed in the supine position on the operating table. IV sedation was administered. The entire upper chest and neck were widely sterilely prepped and draped. Local anesthesia was used to infiltrate the insertion of port site. The right subclavian vein was cannulated with a needle and guidewire without difficulty and position in the superior vena cava was confirmed by fluoroscopy. The introducer was then placed over the guidewire and the flush catheter placed via the introducer which was stripped away and the tip of the catheter positioned near the cavoatrial junction. A small transverse incision was made in the anterior chest wall and subcutaneous pocket created. The catheter was tunneled into the pocket, trimmed to length, and attached to the flushed port which was positioned in the pocket. The port was sutured to the chest wall with interrupted 2-0 Prolene. The incisions were closed with subcutaneous interrupted Monocryl and the skin incisions closed with subcuticular Monocryl and Dermabond. The port was accessed and flushed and aspirated easily and was left flushed with concentrated heparin solution. Sponge needle as the counts were correct. The patient was taken to recovery in good condition.  Mariella Saa MD, FACS  06/12/2011, 11:15 AM

## 2011-06-14 ENCOUNTER — Encounter (HOSPITAL_BASED_OUTPATIENT_CLINIC_OR_DEPARTMENT_OTHER): Payer: Self-pay | Admitting: General Surgery

## 2011-06-15 ENCOUNTER — Encounter (HOSPITAL_BASED_OUTPATIENT_CLINIC_OR_DEPARTMENT_OTHER): Payer: Self-pay | Admitting: Anesthesiology

## 2011-06-19 ENCOUNTER — Ambulatory Visit (HOSPITAL_COMMUNITY): Admission: RE | Admit: 2011-06-19 | Payer: BC Managed Care – PPO | Source: Ambulatory Visit | Admitting: General Surgery

## 2011-06-19 ENCOUNTER — Ambulatory Visit (HOSPITAL_BASED_OUTPATIENT_CLINIC_OR_DEPARTMENT_OTHER): Payer: BC Managed Care – PPO | Admitting: Physician Assistant

## 2011-06-19 ENCOUNTER — Encounter (HOSPITAL_COMMUNITY): Admission: RE | Payer: Self-pay | Source: Ambulatory Visit

## 2011-06-19 ENCOUNTER — Encounter: Payer: Self-pay | Admitting: Physician Assistant

## 2011-06-19 ENCOUNTER — Telehealth: Payer: Self-pay | Admitting: Oncology

## 2011-06-19 VITALS — BP 116/80 | HR 79 | Temp 98.5°F | Wt 148.0 lb

## 2011-06-19 DIAGNOSIS — C50419 Malignant neoplasm of upper-outer quadrant of unspecified female breast: Secondary | ICD-10-CM

## 2011-06-19 DIAGNOSIS — D649 Anemia, unspecified: Secondary | ICD-10-CM

## 2011-06-19 DIAGNOSIS — Z170 Estrogen receptor positive status [ER+]: Secondary | ICD-10-CM

## 2011-06-19 DIAGNOSIS — C50919 Malignant neoplasm of unspecified site of unspecified female breast: Secondary | ICD-10-CM

## 2011-06-19 SURGERY — INSERTION, TUNNELED CENTRAL VENOUS DEVICE, WITH PORT
Anesthesia: Monitor Anesthesia Care

## 2011-06-19 MED ORDER — DEXAMETHASONE 4 MG PO TABS: ORAL_TABLET | ORAL | Status: DC

## 2011-06-19 MED ORDER — LIDOCAINE-PRILOCAINE 2.5-2.5 % EX CREA: TOPICAL_CREAM | CUTANEOUS | Status: DC | PRN

## 2011-06-19 MED ORDER — LORAZEPAM 0.5 MG PO TABS: ORAL_TABLET | ORAL | Status: DC

## 2011-06-19 MED ORDER — PROCHLORPERAZINE MALEATE 10 MG PO TABS: ORAL_TABLET | ORAL | Status: DC

## 2011-06-19 NOTE — Progress Notes (Signed)
Hematology and Oncology Follow Up Visit  Erin Castillo [id number] Nov 05, 1965 45 y.o. 06/19/2011 9:30 PM   Interim History:   Patient returns today accompanied by a friend for followup of her left breast cancer.  She is ready to initiate adjuvant chemotherapy later this week, the plan being to treat with 6 cycles of q. 3 week docetaxel, doxorubicin, and cyclophosphamide, with Neulasta given on day 2 for granulocyte support.  Her first chemo is scheduled for Thursday, 11/29.  We spent approximately 60 minutes together today, over half of which was spent reviewing her treatment plan, the possible side effects, and her antinausea regimen.    She is also being scheduled for chemo school tomorrow morning, and is scheduled for her echocardiogram on Wednesday before chemo on Thursday.  Her port was put in on 11/19 with no complications and a normal chest xray.  Erin Castillo is a little nervous about beginning chemo, but a detailed review of systems is otherwise noncontributory as noted below.  Review of Systems: Constitutional:  no weight loss, fever, night sweats and feels well Eyes: negative [id number]:[id number] Cardiovascular: no chest pain or dyspnea on exertion Respiratory: no cough, shortness of breath, or wheezing Neurological: negative Dermatological: negative Gastrointestinal: no abdominal pain, change in bowel habits, or black or bloody stools Genito-Urinary: no dysuria, trouble voiding, or hematuria Hematological and Lymphatic: negative Breast: negative Musculoskeletal: negative Remaining ROS negative.  Medications: I have reviewed the patient's current medications.  Allergies: No Known Allergies   Physical Exam:  Blood pressure 116/80, pulse 79, temperature 98.5 F (36.9 C), temperature source Oral, weight 148 lb (67.132 kg). HEENT:  Sclerae anicteric, conjunctivae pink.  Oropharynx clear.  No mucositis or candidiasis.  Nodes:  No cervical, supraclavicular, or axillary lymphadenopathy palpated.   Breast Exam:  Deferred.  Lungs:  Clear to auscultation bilaterally.  No crackles, rhonchi, or wheezes.  Heart:  Regular rate and rhythm.  Abdomen:  Soft, nontender.  Positive bowel sounds.  No organomegaly or masses palpated.  Musculoskeletal:  No focal spinal tenderness to palpation.  Extremities:  Benign.  No peripheral edema or cyanosis.  Skin:  Benign.  Neuro:  Nonfocal.   Lab Results: Lab Results  Component Value Date   WBC 8.0 06/06/2011   HGB 11.7* 06/12/2011   HCT 33.5* 06/06/2011   MCV 77.4* 06/06/2011   PLT 305 06/06/2011   NEUTROABS 4.8 06/06/2011     Chemistry      Component Value Date/Time   NA 138 04/26/2011 1219   K 3.6 04/26/2011 1219   CL 101 04/26/2011 1219   CO2 29 04/26/2011 1219   BUN 12 04/26/2011 1219   CREATININE 0.55 04/26/2011 1219      Component Value Date/Time   CALCIUM 9.2 04/26/2011 1219   ALKPHOS 40 04/26/2011 1219   AST 16 04/26/2011 1219   ALT 11 04/26/2011 1219   BILITOT 0.2* 04/26/2011 1219       Radiological Studies: Chest xray on 11/19 was unremarkable.  An echo is pending, scheduled for 06/21/11.  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 The tumor was grade 2, strongly estrogen and progesterone receptor positive, with an MIB-1 of 9% and no HER-2/neu amplification.  Due to initiate adjuvant chemotherapy on 06/22/11, to consist of 6 q 3 week cycles of docetaxel, doxorubicin, and cyclophosphamide, with Neulasta  on day 2 for granulocyte support.  Patient also has asymptomatic anemia, possibly an iron deficiency which we will follow.  Erin Castillo will return on Thursday, 11/29, for her first cycle of TAC.  She was given prescriptions for dexamethasone (which she will begin the day before chemo), prochlorperazine, lorazepam, and EMLA cream, along with written  instructions on how to use all of the medications appropriately.  We reviewed these instructions and she voices understanding. She will attend "Chemo School" tomorrow and will have her echo on Wednesday before her first chemo on Thursday.  Of course, she is also scheduled for Neulasta on Friday and will see me next week on 06/29/11 for assessment of chemotoxicity.   This plan was reviewed with the patient, who voices understanding and agreement.  She knows to call with any changes or problems.    Erin Sasso, PA-C 11/26/20129:30 PM

## 2011-06-19 NOTE — Telephone Encounter (Signed)
Gv pt appt for nov-jan2013 °

## 2011-06-20 ENCOUNTER — Encounter: Payer: Self-pay | Admitting: *Deleted

## 2011-06-20 ENCOUNTER — Other Ambulatory Visit: Payer: BC Managed Care – PPO

## 2011-06-21 ENCOUNTER — Ambulatory Visit (HOSPITAL_COMMUNITY)
Admission: RE | Admit: 2011-06-21 | Discharge: 2011-06-21 | Disposition: A | Discharge status: Home / Self Care | Payer: BC Managed Care – PPO | Source: Ambulatory Visit | Attending: Oncology | Admitting: Oncology

## 2011-06-21 DIAGNOSIS — C50919 Malignant neoplasm of unspecified site of unspecified female breast: Secondary | ICD-10-CM | POA: Insufficient documentation

## 2011-06-21 DIAGNOSIS — Z5111 Encounter for antineoplastic chemotherapy: Secondary | ICD-10-CM

## 2011-06-21 NOTE — Progress Notes (Signed)
  Echocardiogram 2D Echocardiogram has been performed.  Dewitt Hoes, RDCS 06/21/2011, 11:28 AM

## 2011-06-22 ENCOUNTER — Other Ambulatory Visit (HOSPITAL_BASED_OUTPATIENT_CLINIC_OR_DEPARTMENT_OTHER): Payer: BC Managed Care – PPO

## 2011-06-22 ENCOUNTER — Ambulatory Visit (HOSPITAL_BASED_OUTPATIENT_CLINIC_OR_DEPARTMENT_OTHER): Payer: BC Managed Care – PPO

## 2011-06-22 VITALS — BP 117/82 | HR 91 | Temp 98.0°F

## 2011-06-22 DIAGNOSIS — Z5111 Encounter for antineoplastic chemotherapy: Secondary | ICD-10-CM

## 2011-06-22 DIAGNOSIS — C50419 Malignant neoplasm of upper-outer quadrant of unspecified female breast: Secondary | ICD-10-CM

## 2011-06-22 DIAGNOSIS — C50919 Malignant neoplasm of unspecified site of unspecified female breast: Secondary | ICD-10-CM

## 2011-06-22 LAB — CBC WITH DIFFERENTIAL/PLATELET
BASO%: 0.2 % (ref 0.0–2.0)
EOS%: 0 % (ref 0.0–7.0)
MCH: 24.5 pg — ABNORMAL LOW (ref 25.1–34.0)
MCHC: 32.3 g/dL (ref 31.5–36.0)
MONO#: 0.4 10*3/uL (ref 0.1–0.9)
NEUT%: 90.5 % — ABNORMAL HIGH (ref 38.4–76.8)
RBC: 4.45 10*6/uL (ref 3.70–5.45)
RDW: 16.9 % — ABNORMAL HIGH (ref 11.2–14.5)
WBC: 10.9 10*3/uL — ABNORMAL HIGH (ref 3.9–10.3)
lymph#: 0.6 10*3/uL — ABNORMAL LOW (ref 0.9–3.3)
nRBC: 0 % (ref 0–0)

## 2011-06-22 MED ORDER — SODIUM CHLORIDE 0.9 % IV SOLN
150.0000 mg | Freq: Once | INTRAVENOUS | Status: AC
  Administered 2011-06-22: 150 mg via INTRAVENOUS
  Filled 2011-06-22: qty 5

## 2011-06-22 MED ORDER — HEPARIN SOD (PORK) LOCK FLUSH 100 UNIT/ML IV SOLN
500.0000 [IU] | Freq: Once | INTRAVENOUS | Status: DC | PRN
  Filled 2011-06-22: qty 5

## 2011-06-22 MED ORDER — DOCETAXEL CHEMO INJECTION 160 MG/16ML
75.0000 mg/m2 | Freq: Once | INTRAVENOUS | Status: AC
  Administered 2011-06-22: 130 mg via INTRAVENOUS
  Filled 2011-06-22: qty 13

## 2011-06-22 MED ORDER — SODIUM CHLORIDE 0.9 % IV SOLN
Freq: Once | INTRAVENOUS | Status: AC
  Administered 2011-06-22: 13:00:00 via INTRAVENOUS

## 2011-06-22 MED ORDER — DOXORUBICIN HCL CHEMO IV INJECTION 2 MG/ML
50.0000 mg/m2 | Freq: Once | INTRAVENOUS | Status: AC
  Administered 2011-06-22: 88 mg via INTRAVENOUS
  Filled 2011-06-22: qty 44

## 2011-06-22 MED ORDER — SODIUM CHLORIDE 0.9 % IJ SOLN
10.0000 mL | INTRAMUSCULAR | Status: DC | PRN
  Filled 2011-06-22: qty 10

## 2011-06-22 MED ORDER — PALONOSETRON HCL INJECTION 0.25 MG/5ML
0.2500 mg | Freq: Once | INTRAVENOUS | Status: AC
  Administered 2011-06-22: 0.25 mg via INTRAVENOUS

## 2011-06-22 MED ORDER — SODIUM CHLORIDE 0.9 % IV SOLN
500.0000 mg/m2 | Freq: Once | INTRAVENOUS | Status: AC
  Administered 2011-06-22: 880 mg via INTRAVENOUS
  Filled 2011-06-22: qty 44

## 2011-06-22 MED ORDER — DEXAMETHASONE SODIUM PHOSPHATE 4 MG/ML IJ SOLN
12.0000 mg | Freq: Once | INTRAMUSCULAR | Status: AC
  Administered 2011-06-22: 12 mg via INTRAVENOUS

## 2011-06-23 ENCOUNTER — Ambulatory Visit (HOSPITAL_BASED_OUTPATIENT_CLINIC_OR_DEPARTMENT_OTHER): Payer: BC Managed Care – PPO

## 2011-06-23 VITALS — BP 107/71 | HR 91 | Temp 96.9°F

## 2011-06-23 DIAGNOSIS — C50419 Malignant neoplasm of upper-outer quadrant of unspecified female breast: Secondary | ICD-10-CM

## 2011-06-23 MED ORDER — PEGFILGRASTIM INJECTION 6 MG/0.6ML
6.0000 mg | Freq: Once | SUBCUTANEOUS | Status: AC
  Administered 2011-06-23: 6 mg via SUBCUTANEOUS
  Filled 2011-06-23: qty 0.6

## 2011-06-28 ENCOUNTER — Other Ambulatory Visit (HOSPITAL_BASED_OUTPATIENT_CLINIC_OR_DEPARTMENT_OTHER): Payer: BC Managed Care – PPO | Admitting: Lab

## 2011-06-28 ENCOUNTER — Telehealth: Payer: Self-pay | Admitting: *Deleted

## 2011-06-28 ENCOUNTER — Other Ambulatory Visit: Payer: Self-pay

## 2011-06-28 ENCOUNTER — Encounter: Payer: Self-pay | Admitting: Physician Assistant

## 2011-06-28 ENCOUNTER — Other Ambulatory Visit: Payer: Self-pay | Admitting: *Deleted

## 2011-06-28 ENCOUNTER — Ambulatory Visit (HOSPITAL_BASED_OUTPATIENT_CLINIC_OR_DEPARTMENT_OTHER): Payer: BC Managed Care – PPO

## 2011-06-28 ENCOUNTER — Other Ambulatory Visit: Payer: Self-pay | Admitting: Oncology

## 2011-06-28 DIAGNOSIS — C50919 Malignant neoplasm of unspecified site of unspecified female breast: Secondary | ICD-10-CM

## 2011-06-28 DIAGNOSIS — R110 Nausea: Secondary | ICD-10-CM

## 2011-06-28 DIAGNOSIS — C50419 Malignant neoplasm of upper-outer quadrant of unspecified female breast: Secondary | ICD-10-CM

## 2011-06-28 DIAGNOSIS — R5381 Other malaise: Secondary | ICD-10-CM

## 2011-06-28 DIAGNOSIS — R5383 Other fatigue: Secondary | ICD-10-CM

## 2011-06-28 LAB — CBC WITH DIFFERENTIAL/PLATELET
Basophils Absolute: 0 10*3/uL (ref 0.0–0.1)
EOS%: 1.4 % (ref 0.0–7.0)
Eosinophils Absolute: 0 10*3/uL (ref 0.0–0.5)
HGB: 9.7 g/dL — ABNORMAL LOW (ref 11.6–15.9)
MCV: 74.9 fL — ABNORMAL LOW (ref 79.5–101.0)
MONO%: 21.4 % — ABNORMAL HIGH (ref 0.0–14.0)
NEUT#: 0.1 10*3/uL — CL (ref 1.5–6.5)
RBC: 4.03 10*6/uL (ref 3.70–5.45)
RDW: 16.7 % — ABNORMAL HIGH (ref 11.2–14.5)
lymph#: 0.4 10*3/uL — ABNORMAL LOW (ref 0.9–3.3)
nRBC: 0 % (ref 0–0)

## 2011-06-28 LAB — BASIC METABOLIC PANEL
Calcium: 9 mg/dL (ref 8.4–10.5)
Potassium: 3.6 mEq/L (ref 3.5–5.3)
Sodium: 140 mEq/L (ref 135–145)

## 2011-06-28 MED ORDER — SODIUM CHLORIDE 0.45 % IV SOLN
1000.0000 mL | Freq: Once | INTRAVENOUS | Status: AC
  Administered 2011-06-28: 1000 mL via INTRAVENOUS
  Filled 2011-06-28: qty 1000

## 2011-06-28 MED ORDER — CIPROFLOXACIN HCL 500 MG PO TABS: 500.0000 mg | ORAL_TABLET | Freq: Two times a day (BID) | ORAL | Status: DC

## 2011-06-28 NOTE — Patient Instructions (Signed)
Patient ambulated out of the department without difficulty. Patient aware of plan and instructed to call MD for any concerns.

## 2011-06-28 NOTE — Telephone Encounter (Signed)
Pt left message x 2 stating onset of continued nausea post chemo and weakness. Asiana called on call MD who ordered decadron and prilosec and suggested pt to call this AM for IVF.  Per treatment room above can be done at 1230pm today.  Called and discussed with pt who verbalized understanding.

## 2011-06-28 NOTE — Progress Notes (Signed)
Hematology and Oncology Chart Note  Erin Castillo [id number] [birth year] 45 y.o. 06/28/2011    The patient is currently day 7 cycle one of docetaxel, doxorubicin, and cyclophosphamide given every 3 weeks in the adjuvant setting for left breast carcinoma. First dose was given last Thursday, November 29.  The patient contacted the office this morning. She had actually spoken with the on-call physician last night with complaints of increased nausea, occasional diarrhea, and difficulty hydrating herself. She is advised to take dexamethasone and also started on omeprazole, both of which have given her some relief. She still feels a little dehydrated, and is here today for supportive IV fluids.  The patient did take her antiemetics appropriately. Her nausea has improved since yesterday, and she has had no additional loose stools. She also denies any fevers, chills, or night sweats. She is scheduled to return tomorrow, day 8 cycle 1, for assessment chemotoxicity and we will keep that appointment as planned. Approximately 10-15 minutes was spent with this patient, going over her side effects associated treatment plan. She voices understanding and agreement with our plan today.   Deerica Waszak, PA-C 06/28/2011

## 2011-06-29 ENCOUNTER — Ambulatory Visit (HOSPITAL_BASED_OUTPATIENT_CLINIC_OR_DEPARTMENT_OTHER): Payer: BC Managed Care – PPO | Admitting: Physician Assistant

## 2011-06-29 ENCOUNTER — Encounter: Payer: Self-pay | Admitting: Physician Assistant

## 2011-06-29 ENCOUNTER — Other Ambulatory Visit: Payer: BC Managed Care – PPO

## 2011-06-29 ENCOUNTER — Telehealth: Payer: Self-pay | Admitting: Oncology

## 2011-06-29 VITALS — BP 113/74 | HR 98 | Temp 97.5°F | Ht 65.0 in | Wt 153.4 lb

## 2011-06-29 DIAGNOSIS — D702 Other drug-induced agranulocytosis: Secondary | ICD-10-CM

## 2011-06-29 DIAGNOSIS — C50419 Malignant neoplasm of upper-outer quadrant of unspecified female breast: Secondary | ICD-10-CM

## 2011-06-29 DIAGNOSIS — R110 Nausea: Secondary | ICD-10-CM

## 2011-06-29 DIAGNOSIS — D6481 Anemia due to antineoplastic chemotherapy: Secondary | ICD-10-CM

## 2011-06-29 DIAGNOSIS — T451X5A Adverse effect of antineoplastic and immunosuppressive drugs, initial encounter: Secondary | ICD-10-CM

## 2011-06-29 MED ORDER — ONDANSETRON HCL 8 MG PO TABS: 8.0000 mg | ORAL_TABLET | Freq: Two times a day (BID) | ORAL | Status: AC | PRN

## 2011-06-29 MED ORDER — TOBRAMYCIN-DEXAMETHASONE 0.3-0.1 % OP SUSP: 1.0000 [drp] | Freq: Two times a day (BID) | OPHTHALMIC | Status: DC

## 2011-06-29 NOTE — Telephone Encounter (Signed)
Gv pt appt for dec-jan2013 ° °

## 2011-06-29 NOTE — Progress Notes (Signed)
Hematology and Oncology Follow Up Visit  Erin Castillo [id number] Apr 20, 1966 45 y.o. 06/29/2011    HPI: Erin Castillo is a [place] woman the age of 71 was referred by [name] for evaluation of left breast carcinoma.  The patient noticed a dimple in the lateral left breast and was able to palpate a mass. She waited a couple of months since she is still premenopausal, but the mass did not resolve. She brought it to the attention of [name] and underwent bilateral diagnostic mammography and left ultrasonography at [facility] on 04/20/2011. Compression views as well standard CC and  MLO views showed a spiculated mass in the upper outer quadrant of the left breast measuring 2.1 cm. By ultrasound, this was a lobulated, irregular, and hypoechoic mass measuring 1.8 cm. No abnormal appearing lymph nodes in the axilla.  Biopsy was performed on 04/20/2011 [phone]) showing an invasive ductal carcinoma which on the pathologic report was described as grade 2. The tumor was ER positive at 98%, PR positive at 100%, HER-2/neu negative with a CISH ratio of 1.42, and an MIB-1 of 9%.  The patient underwent bilateral breast MRI on 10-12, confirming a 2.3 cm irregularly marginated, enhancing mass, deep in the upper outer quadrant of the left breast. This was close to the underlying pectoralis major, but without definite extension into the muscle. No other suspicious areas in either breast, and no abnormal appearing lymph nodes. An incidental 1.1 cm anterior liver cyst was noted.  The patient is status post left lumpectomy and sentinel lymph node sampling on 05/15/2011 under the care of [name]. 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 No evidence of lymphovascular invasion. One of one sentinel lymph nodes was involved however.  An Oncotype DX had already been requested, since initially the patient appeared to be node negative.  This showed the patient to be slightly on the low risk side.  Patient is known to be BRCA 1 and 2 negative.  The patient received one dose of docetaxel, doxorubicin, and cyclophosphamide on 06/22/2011 with poor tolerance. Upon review of the Oncotype results, especially when taking into consideration the patient's poor tolerance of the first cycle of chemotherapy, the decision was made to discontinue the doxorubicin, and proceed with 3 additional cycles of docetaxel and cyclophosphamide given on a Q. three-week basis, for a total of 4 cycles of adjuvant chemotherapy.  Interim History:   Patient returns today for assessment of chemotoxicity on day 8 cycle 1 of docetaxel, doxorubicin, and cyclophosphamide. She received Neulasta on day 2 for granulocyte support. She was seen here in the office yesterday, day 7, for nausea and dehydration in addition to fatigue. She was given supportive IV fluids which did in fact seem to be helpful. I will mention that the patient has a history of hyperemesis with pregnancy x2.  On presentation today, the patient is feeling a little better. She did have excessive nausea for approximately 4 days following chemotherapy. This seems to have improved, then worsened again on Monday. She's also had some reflux symptoms for which she is on omeprazole. She's had some loose bowel movements which are beginning to improve. She feels very tired and fatigued. She's had some bony aches and pains in her legs bilaterally.  The patient was found to be neutropenic yesterday, and was started on Cipro prophylactically, 500 mg by mouth twice a day x7 days. Fortunately, she denies any fevers, chills, or night sweats.  Patient is  also had no signs of numbness or tingling in the extremities. No mouth ulcers, oral sensitivity, or problems swallowing. No skin changes, nailbed changes, or nail bed sensitivity. No excessive tearing.  A detailed review of systems is otherwise noncontributory as noted  below.  Review of Systems: Constitutional:  Fatigue, no weight loss, fever, night sweats Eyes: negative [id number] Cardiovascular: no chest pain or dyspnea on exertion Respiratory: no cough, shortness of breath, or wheezing Neurological: negative Dermatological: negative Gastrointestinal: positive for - change in bowel habits, heartburn and nausea/vomiting negative for - abdominal pain, blood in stools or swallowing difficulty/pain Genito-Urinary: no dysuria, trouble voiding, or hematuria Hematological and Lymphatic: negative Breast: negative Musculoskeletal: positive for - muscle pain Remaining ROS negative.  Medications: I have reviewed the patient's current medications. Current Outpatient Prescriptions  Medication Sig Dispense Refill  . b complex vitamins tablet Take 1 tablet by mouth daily.        . ciprofloxacin (CIPRO) 500 MG tablet Take 1 tablet (500 mg total) by mouth 2 (two) times daily.  7 tablet  3  . citalopram (CELEXA) 20 MG tablet Take 20 mg by mouth daily.        Marland Kitchen dexamethasone (DECADRON) 4 MG tablet 2 tabs po BID x 5 days, beginning the day before each chemo dose  40 tablet  2  . lidocaine-prilocaine (EMLA) cream Apply topically as needed.  30 g  3  . LORazepam (ATIVAN) 0.5 MG tablet 1 tab po BID prn anxiety and/or nausea  30 tablet  0  . omeprazole (PRILOSEC) 20 MG capsule Take 20 mg by mouth daily.        . prochlorperazine (COMPAZINE) 10 MG tablet 1 tab po ACHS x 3 days after each chemo, then 1 tab PO q 6 hr prn nausea  30 tablet  3    Allergies: No Known Allergies  Physical Exam: Filed Vitals:   06/29/11 1337  BP: 113/74  Pulse: 98  Temp: 97.5 F (36.4 C)   HEENT:  Sclerae anicteric, conjunctivae pink.  Oropharynx clear.  No mucositis or candidiasis.   Nodes:  No cervical, supraclavicular, or axillary lymphadenopathy palpated.  Breast Exam:  Deferred.   Lungs:  Clear to auscultation bilaterally.  No crackles, rhonchi, or wheezes.   Heart:  Regular  rate and rhythm.   Abdomen:  Soft, nontender.  Positive bowel sounds.  No organomegaly or masses palpated.   Musculoskeletal:  No focal spinal tenderness to palpation.  Extremities:  Benign.  No peripheral edema or cyanosis.   Skin:  Benign.   Neuro:  Nonfocal.   Lab Results: Lab Results  Component Value Date   WBC 0.7* 06/28/2011   HGB 9.7* 06/28/2011   HCT 30.2* 06/28/2011   MCV 74.9* 06/28/2011   PLT 183 06/28/2011   NEUTROABS 0.1* 06/28/2011     Chemistry      Component Value Date/Time   NA 140 06/28/2011 1229   K 3.6 06/28/2011 1229   CL 104 06/28/2011 1229   CO2 28 06/28/2011 1229   BUN 12 06/28/2011 1229   CREATININE 0.47* 06/28/2011 1229      Component Value Date/Time   CALCIUM 9.0 06/28/2011 1229   ALKPHOS 40 04/26/2011 1219   AST 16 04/26/2011 1219   ALT 11 04/26/2011 1219   BILITOT 0.2* 04/26/2011 1219        Radiological Studies:  Dg Chest Portable 1 View  06/12/2011  *RADIOLOGY REPORT*  Clinical Data: Port-A-Cath placement.  PORTABLE CHEST - 1 VIEW  Comparison: None.  Findings: 1117 hours.  Right subclavian power port tip is near the SVC right atrial junction.  There is no pneumothorax.  The lungs are clear.  Heart size and mediastinal contours are normal.  IMPRESSION: Right subclavian power port placement without demonstrated complication.  Original Report Authenticated By: Gerrianne Scale, M.D.   Dg Fluoro Guide Cv Line-no Report  06/12/2011  CLINICAL DATA: Portacath   FLOURO GUIDE CV LINE  Fluoroscopy was utilized by the requesting physician.  No radiographic  interpretation.       Assessment:  45 year old [place] woman   1.  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 The tumor was grade 2, strongly estrogen and progesterone receptor positive, with an MIB-1 of 9% and no HER-2/neu amplification.   2. Currently  day 8, cycle 1 of 6 q 3 week cycles of docetaxel, doxorubicin, and cyclophosphamide, with poor tolerance, and persistent nausea with dehydration.  3. Treatment regimen has been changed, and will consist of 3 additional q. three-week doses of docetaxel and cyclophosphamide, minus the doxorubicin as addressed in the history of present illness.   4. Afebrile neutropenia despite Neulasta on day 2 for granulocyte support.   5. Patient also has asymptomatic anemia, possibly an iron deficiency which we will follow.  6. Patient is known to be BRCA 1 and 2 negative.  Plan:  This case was reviewed with Dr. Darnelle Catalan, and over half of our 50 minute appointment today was spent going over the described change in the  patient's treatment regimen. Specifically we will be discontinuing the doxorubicin, and peripheral proceed with 3 additional doses of docetaxel and cyclophosphamide. This would be a total of 4 cycles of adjuvant chemotherapy.  We again reviewed neutropenic precautions and the patient knows to contact us with any times of 100 or above. She will continue on her course of Cipro.  I am also suggesting the patient try ondansetron, 8 mg by mouth twice daily with beginning with cycle 2. We'll also try a dexamethasone taper for several days following her next cycle. I encouraged her to keep herself very well hydrated but to let us know if the nausea continues.  Patient return next week on December 13 for repeat labs only. She'll see Dr. Darnelle Catalan in 2 weeks, December 20, prior to her second dose of adjuvant chemotherapy. For supportive measures, we are scheduling her for supportive IV fluids on day 2 when she returns for her Neulasta injection. These appointments have been scheduled accordingly.  This plan was reviewed with the patient, who voices understanding and agreement.  She knows to call with any changes or problems.    Kodi Guerrera, PA-C 06/29/2011

## 2011-06-30 ENCOUNTER — Other Ambulatory Visit: Payer: Self-pay | Admitting: Oncology

## 2011-06-30 ENCOUNTER — Emergency Department (HOSPITAL_COMMUNITY)
Admission: EM | Admit: 2011-06-30 | Discharge: 2011-06-30 | Disposition: A | Discharge status: Home / Self Care | Payer: BC Managed Care – PPO | Attending: Emergency Medicine | Admitting: Emergency Medicine

## 2011-06-30 ENCOUNTER — Encounter (HOSPITAL_COMMUNITY): Payer: Self-pay | Admitting: Emergency Medicine

## 2011-06-30 ENCOUNTER — Telehealth: Payer: Self-pay

## 2011-06-30 DIAGNOSIS — C50919 Malignant neoplasm of unspecified site of unspecified female breast: Secondary | ICD-10-CM | POA: Insufficient documentation

## 2011-06-30 DIAGNOSIS — Z79899 Other long term (current) drug therapy: Secondary | ICD-10-CM | POA: Insufficient documentation

## 2011-06-30 DIAGNOSIS — R10819 Abdominal tenderness, unspecified site: Secondary | ICD-10-CM | POA: Insufficient documentation

## 2011-06-30 DIAGNOSIS — R112 Nausea with vomiting, unspecified: Secondary | ICD-10-CM

## 2011-06-30 DIAGNOSIS — R197 Diarrhea, unspecified: Secondary | ICD-10-CM | POA: Insufficient documentation

## 2011-06-30 DIAGNOSIS — C50419 Malignant neoplasm of upper-outer quadrant of unspecified female breast: Secondary | ICD-10-CM

## 2011-06-30 LAB — COMPREHENSIVE METABOLIC PANEL
ALT: 15 U/L (ref 0–35)
AST: 17 U/L (ref 0–37)
Albumin: 3.2 g/dL — ABNORMAL LOW (ref 3.5–5.2)
CO2: 29 mEq/L (ref 19–32)
Calcium: 9 mg/dL (ref 8.4–10.5)
Creatinine, Ser: 0.52 mg/dL (ref 0.50–1.10)
GFR calc non Af Amer: >90 mL/min (ref >90)
Sodium: 138 mEq/L (ref 135–145)
Total Protein: 6.2 g/dL (ref 6.0–8.3)

## 2011-06-30 LAB — URINALYSIS, ROUTINE W REFLEX MICROSCOPIC
Glucose, UA: NEGATIVE mg/dL
Leukocytes, UA: NEGATIVE
Protein, ur: NEGATIVE mg/dL
Specific Gravity, Urine: 1.017 (ref 1.005–1.030)
Urobilinogen, UA: 0.2 mg/dL (ref 0.0–1.0)

## 2011-06-30 LAB — CBC
HCT: 29.3 % — ABNORMAL LOW (ref 36.0–46.0)
MCH: 24.7 pg — ABNORMAL LOW (ref 26.0–34.0)
MCV: 75.3 fL — ABNORMAL LOW (ref 78.0–100.0)
Platelets: 145 10*3/uL — ABNORMAL LOW (ref 150–400)
RBC: 3.89 MIL/uL (ref 3.87–5.11)
WBC: 13.7 10*3/uL — ABNORMAL HIGH (ref 4.0–10.5)

## 2011-06-30 LAB — DIFFERENTIAL
Band Neutrophils: 8 % (ref 0–10)
Basophils Absolute: 0 10*3/uL (ref 0.0–0.1)
Basophils Relative: 0 % (ref 0–1)
Eosinophils Absolute: 0 10*3/uL (ref 0.0–0.7)
Eosinophils Relative: 0 % (ref 0–5)
Metamyelocytes Relative: 3 %
Monocytes Absolute: 0.8 10*3/uL (ref 0.1–1.0)
Monocytes Relative: 6 % (ref 3–12)
Myelocytes: 6 %

## 2011-06-30 MED ORDER — HEPARIN SOD (PORK) LOCK FLUSH 100 UNIT/ML IV SOLN
500.0000 [IU] | Freq: Once | INTRAVENOUS | Status: AC
  Administered 2011-06-30: 500 [IU] via INTRAVENOUS
  Filled 2011-06-30: qty 5

## 2011-06-30 MED ORDER — SODIUM CHLORIDE 0.9 % IV BOLUS (SEPSIS)
1000.0000 mL | Freq: Once | INTRAVENOUS | Status: AC
  Administered 2011-06-30: 1000 mL via INTRAVENOUS

## 2011-06-30 MED ORDER — SODIUM CHLORIDE 0.9 % IV SOLN
INTRAVENOUS | Status: DC
  Filled 2011-06-30: qty 1000

## 2011-06-30 MED ORDER — ONDANSETRON HCL 4 MG/2ML IJ SOLN
4.0000 mg | Freq: Once | INTRAMUSCULAR | Status: AC
  Administered 2011-06-30: 4 mg via INTRAVENOUS
  Filled 2011-06-30: qty 2

## 2011-06-30 NOTE — ED Notes (Signed)
Pt started having nausea this past Tuesday after having chemo for breast cancer on 11/29. She spoke with her oncologist last night and was told if she had a temp of 100 or more during the night to come here and get checked out, thus why she is here. She is having nausea and diarrhea, no vomiting. She is achy but says that this could be a side effect of the chemo.

## 2011-06-30 NOTE — ED Provider Notes (Signed)
History     CSN: [id number] Arrival date & time: 06/30/2011 10:35 AM   First MD Initiated Contact with Patient 06/30/11 1217      Chief Complaint  Patient presents with  . Fever  . Nausea  . Diarrhea  . Cancer    (Consider location/radiation/quality/duration/timing/severity/associated sxs/prior treatment) HPI Patient is a 45 yo F with breast cancer who presents complaining of abdominal pain nausea and diarrhea as well as a temp of 100.  Patient completed her first round of chemo on Thursday and developed a temp of 100 today.  She was neutropenic at last lab check and was told to come in if she developed fever to 100.  She denies any GU or respiratory symptoms.  He has no known sick contacts.  There are no associated or modifying factors.   Past Medical History  Diagnosis Date  . Cancer     breast   . Migraines   . Anxiety     Past Surgical History  Procedure Date  . Nasal sinus surgery 1991  . Cesarean section 2000  . Breast lumpectomy 05/15/11    left breast lumpectomy   . Sinus exploration     remote  . Mastectomy partial / lumpectomy w/ axillary lymphadenectomy 10/12    snbx  . Portacath placement 06/12/2011    Procedure: INSERTION PORT-A-CATH;  Surgeon: Mariella Saa, MD;  Location: [facility];  Service: General;  Laterality: Right;  Right Subclavian Vein    Family History  Problem Relation Age of Onset  . Cancer Father     History  Substance Use Topics  . Smoking status: Never Smoker   . Smokeless tobacco: Never Used  . Alcohol Use: 0.0 oz/week    2-3 Glasses of wine per week    OB History    Grav Para Term Preterm Abortions TAB SAB Ect Mult Living                  Review of Systems  Constitutional: Positive for fever.  HENT: Negative.   Eyes: Negative.   Respiratory: Negative.   Cardiovascular: Negative.   Gastrointestinal: Positive for nausea, abdominal pain and diarrhea. Negative for vomiting.  Genitourinary: Negative.     Musculoskeletal: Negative.   Skin: Negative.   Neurological: Negative.   Hematological: Negative.   Psychiatric/Behavioral: Negative.   All other systems reviewed and are negative.    Allergies  Review of patient's allergies indicates no known allergies.  Home Medications   Current Outpatient Rx  Name Route Sig Dispense Refill  . B COMPLEX PO TABS Oral Take 1 tablet by mouth daily.      Marland Kitchen CIPROFLOXACIN HCL 500 MG PO TABS Oral Take 500 mg by mouth 2 (two) times daily. 7 day course     . CITALOPRAM HYDROBROMIDE 20 MG PO TABS Oral Take 20 mg by mouth daily.      Marland Kitchen DEXAMETHASONE 4 MG PO TABS  2 tabs po BID x 5 days, beginning the day before each chemo dose 40 tablet 2  . IBUPROFEN 200 MG PO TABS Oral Take 400 mg by mouth every 6 (six) hours as needed. For pain     . LIDOCAINE-PRILOCAINE 2.5-2.5 % EX CREA Topical Apply 1 application topically daily as needed. For chemo     . LOPERAMIDE HCL 2 MG PO CAPS Oral Take 2 mg by mouth 4 (four) times daily as needed. For diarrhea     . LORAZEPAM 0.5 MG PO TABS  1  tab po BID prn anxiety and/or nausea 30 tablet 0  . OMEPRAZOLE 20 MG PO CPDR Oral Take 20 mg by mouth daily as needed. For heartburn    . ONDANSETRON HCL 8 MG PO TABS Oral Take 1 tablet (8 mg total) by mouth every 12 (twelve) hours as needed for nausea. 30 tablet 1  . PROCHLORPERAZINE MALEATE 10 MG PO TABS  1 tab po ACHS x 3 days after each chemo, then 1 tab PO q 6 hr prn nausea 30 tablet 3  . TOBRAMYCIN-DEXAMETHASONE 0.3-0.1 % OP SUSP Both Eyes Place 1 drop into both eyes 2 (two) times daily. 5 mL 0    BP 102/59  Pulse 85  Temp 98.4 F (36.9 C)  SpO2 99%  LMP 06/22/2011  Physical Exam  Nursing note and vitals reviewed. Constitutional: She is oriented to person, place, and time. She appears well-developed and well-nourished. No distress.  HENT:  Head: Normocephalic and atraumatic.  Eyes: Conjunctivae and EOM are normal. Pupils are equal, round, and reactive to light.  Neck:  Normal range of motion.  Cardiovascular: Normal rate, regular rhythm, normal heart sounds and intact distal pulses.  Exam reveals no gallop and no friction rub.   No murmur heard. Pulmonary/Chest: Effort normal and breath sounds normal. No respiratory distress. She has no wheezes. She has no rales.  Abdominal: Soft. Bowel sounds are normal. She exhibits no distension. There is tenderness. There is no rebound and no guarding.       Diffuse abdominal tenderness that is mild to moderate  Musculoskeletal: Normal range of motion. She exhibits no edema and no tenderness.  Neurological: She is alert and oriented to person, place, and time. No cranial nerve deficit. She exhibits normal muscle tone. Coordination normal.  Skin: Skin is warm and dry. No rash noted.  Psychiatric: She has a normal mood and affect.    ED Course  Procedures (including critical care time)  Labs Reviewed  CBC - Abnormal; Notable for the following:    WBC 13.7 (*)    Hemoglobin 9.6 (*)    HCT 29.3 (*)    MCV 75.3 (*)    MCH 24.7 (*)    RDW 17.1 (*)    Platelets 145 (*)    All other components within normal limits  DIFFERENTIAL - Abnormal; Notable for the following:    Neutrophils Relative 40 (*)    Neutro Abs 8.1 (*)    Lymphs Abs 4.8 (*)    All other components within normal limits  COMPREHENSIVE METABOLIC PANEL - Abnormal; Notable for the following:    Albumin 3.2 (*)    Total Bilirubin 0.1 (*)    All other components within normal limits  LIPASE, BLOOD  URINALYSIS, ROUTINE W REFLEX MICROSCOPIC  URINE CULTURE   No results found.   1. Nausea, vomiting and diarrhea       MDM  Patient was evaluated and was treated symptomatically with zofran and IVF.  She had a CBC and BMP performed as well as a UA.  She actually had a very mild leukocytosis of 13.7.  UA was unremarkable, anemia was stable from last check, and renal was WNL.  I spoke with Dr. Welton Flakes who was on call for the patient's oncologist Dr. Darnelle Catalan.   She felt the leukocytosis was secondary to most recent dose of neulasta and that the patient could be discharged home.  The patient was discharged in good condition.        Cyndra Numbers, MD 06/30/11  [id number] 

## 2011-06-30 NOTE — ED Notes (Signed)
Pt was given discharge instructions and stated understanding and indep to discharge  Window.

## 2011-06-30 NOTE — Telephone Encounter (Signed)
Received message from pt stating that she was told to call if she did not feel any better, and she has had diarrhea since 3 am and her temp got up to 100 last night.  Called pt back, and she states she did take some tylenol, and her temp is now 98.9 / 99.2.  Pt states she has had diarrhea constantly since 3 am, but she feels awful.  Per Dr. Darnelle Catalan and Dr. Welton Flakes, on-call MD, pt to report to ER for further evaluation.  Called pt back and informed her of this, and she verbalizes understanding.  Bryson Ha, charge nurse ER, and gave her report to make her aware.

## 2011-07-02 LAB — URINE CULTURE: Colony Count: 75000

## 2011-07-03 ENCOUNTER — Telehealth: Payer: Self-pay | Admitting: Oncology

## 2011-07-03 NOTE — Telephone Encounter (Signed)
per pt req an earlier appt for her lab/aom

## 2011-07-06 ENCOUNTER — Other Ambulatory Visit: Payer: BC Managed Care – PPO

## 2011-07-06 ENCOUNTER — Other Ambulatory Visit (HOSPITAL_BASED_OUTPATIENT_CLINIC_OR_DEPARTMENT_OTHER): Payer: BC Managed Care – PPO | Admitting: Lab

## 2011-07-06 DIAGNOSIS — C50919 Malignant neoplasm of unspecified site of unspecified female breast: Secondary | ICD-10-CM

## 2011-07-06 LAB — CBC WITH DIFFERENTIAL/PLATELET
Basophils Absolute: 0.2 10*3/uL — ABNORMAL HIGH (ref 0.0–0.1)
EOS%: 0 % (ref 0.0–7.0)
Eosinophils Absolute: 0 10*3/uL (ref 0.0–0.5)
HGB: 10.5 g/dL — ABNORMAL LOW (ref 11.6–15.9)
LYMPH%: 11.6 % — ABNORMAL LOW (ref 14.0–49.7)
MCH: 24.5 pg — ABNORMAL LOW (ref 25.1–34.0)
MCV: 76.2 fL — ABNORMAL LOW (ref 79.5–101.0)
MONO%: 6.9 % (ref 0.0–14.0)
NEUT#: 17.4 10*3/uL — ABNORMAL HIGH (ref 1.5–6.5)
Platelets: 310 10*3/uL (ref 145–400)

## 2011-07-13 ENCOUNTER — Ambulatory Visit (HOSPITAL_BASED_OUTPATIENT_CLINIC_OR_DEPARTMENT_OTHER): Payer: BC Managed Care – PPO

## 2011-07-13 ENCOUNTER — Ambulatory Visit (HOSPITAL_BASED_OUTPATIENT_CLINIC_OR_DEPARTMENT_OTHER): Payer: BC Managed Care – PPO | Admitting: Oncology

## 2011-07-13 ENCOUNTER — Other Ambulatory Visit (HOSPITAL_BASED_OUTPATIENT_CLINIC_OR_DEPARTMENT_OTHER): Payer: BC Managed Care – PPO | Admitting: Lab

## 2011-07-13 ENCOUNTER — Telehealth: Payer: Self-pay | Admitting: Oncology

## 2011-07-13 VITALS — BP 114/76 | HR 103 | Temp 98.0°F | Ht 65.0 in | Wt 151.9 lb

## 2011-07-13 DIAGNOSIS — C50419 Malignant neoplasm of upper-outer quadrant of unspecified female breast: Secondary | ICD-10-CM

## 2011-07-13 DIAGNOSIS — C50919 Malignant neoplasm of unspecified site of unspecified female breast: Secondary | ICD-10-CM

## 2011-07-13 DIAGNOSIS — Z170 Estrogen receptor positive status [ER+]: Secondary | ICD-10-CM

## 2011-07-13 DIAGNOSIS — Z5111 Encounter for antineoplastic chemotherapy: Secondary | ICD-10-CM

## 2011-07-13 DIAGNOSIS — L708 Other acne: Secondary | ICD-10-CM

## 2011-07-13 LAB — CBC WITH DIFFERENTIAL/PLATELET
BASO%: 0.2 % (ref 0.0–2.0)
EOS%: 0 % (ref 0.0–7.0)
HCT: 33.4 % — ABNORMAL LOW (ref 34.8–46.6)
LYMPH%: 6.2 % — ABNORMAL LOW (ref 14.0–49.7)
MCH: 24.7 pg — ABNORMAL LOW (ref 25.1–34.0)
MCHC: 31.7 g/dL (ref 31.5–36.0)
MCV: 77.9 fL — ABNORMAL LOW (ref 79.5–101.0)
MONO%: 4.3 % (ref 0.0–14.0)
NEUT%: 89.3 % — ABNORMAL HIGH (ref 38.4–76.8)
Platelets: 727 10*3/uL — ABNORMAL HIGH (ref 145–400)

## 2011-07-13 LAB — COMPREHENSIVE METABOLIC PANEL
AST: 26 U/L (ref 0–37)
Alkaline Phosphatase: 58 U/L (ref 39–117)
BUN: 20 mg/dL (ref 6–23)
Calcium: 9.5 mg/dL (ref 8.4–10.5)
Chloride: 105 mEq/L (ref 96–112)
Creatinine, Ser: 0.5 mg/dL (ref 0.50–1.10)
Total Bilirubin: 0.2 mg/dL — ABNORMAL LOW (ref 0.3–1.2)

## 2011-07-13 MED ORDER — DOCETAXEL CHEMO INJECTION 160 MG/16ML
75.0000 mg/m2 | Freq: Once | INTRAVENOUS | Status: AC
  Administered 2011-07-13: 130 mg via INTRAVENOUS
  Filled 2011-07-13: qty 13

## 2011-07-13 MED ORDER — SODIUM CHLORIDE 0.9 % IJ SOLN
3.0000 mL | INTRAMUSCULAR | Status: DC | PRN
  Administered 2011-07-13: 15:00:00 via INTRAVENOUS
  Filled 2011-07-13: qty 10

## 2011-07-13 MED ORDER — ALTEPLASE 2 MG IJ SOLR
2.0000 mg | Freq: Once | INTRAMUSCULAR | Status: DC | PRN
  Filled 2011-07-13: qty 2

## 2011-07-13 MED ORDER — DEXAMETHASONE SODIUM PHOSPHATE 4 MG/ML IJ SOLN
20.0000 mg | Freq: Once | INTRAMUSCULAR | Status: AC
  Administered 2011-07-13: 20 mg via INTRAVENOUS

## 2011-07-13 MED ORDER — SODIUM CHLORIDE 0.9 % IV SOLN
Freq: Once | INTRAVENOUS | Status: AC
  Administered 2011-07-13: 13:00:00 via INTRAVENOUS

## 2011-07-13 MED ORDER — HEPARIN SOD (PORK) LOCK FLUSH 100 UNIT/ML IV SOLN
250.0000 [IU] | Freq: Once | INTRAVENOUS | Status: DC | PRN
  Filled 2011-07-13: qty 5

## 2011-07-13 MED ORDER — ONDANSETRON 16 MG/50ML IVPB (CHCC)
16.0000 mg | Freq: Once | INTRAVENOUS | Status: AC
  Administered 2011-07-13: 16 mg via INTRAVENOUS

## 2011-07-13 MED ORDER — SODIUM CHLORIDE 0.9 % IJ SOLN
10.0000 mL | INTRAMUSCULAR | Status: DC | PRN
  Filled 2011-07-13: qty 10

## 2011-07-13 MED ORDER — HEPARIN SOD (PORK) LOCK FLUSH 100 UNIT/ML IV SOLN
500.0000 [IU] | Freq: Once | INTRAVENOUS | Status: AC | PRN
  Administered 2011-07-13: 500 [IU]
  Filled 2011-07-13: qty 5

## 2011-07-13 MED ORDER — SODIUM CHLORIDE 0.9 % IV SOLN
600.0000 mg/m2 | Freq: Once | INTRAVENOUS | Status: AC
  Administered 2011-07-13: 1080 mg via INTRAVENOUS
  Filled 2011-07-13: qty 54

## 2011-07-13 NOTE — Telephone Encounter (Signed)
Gv pt appt for dec-jan2013 ° °

## 2011-07-13 NOTE — Progress Notes (Signed)
Hematology and Oncology Follow Up Visit  Erin Castillo [id number] 1966-04-25 45 y.o. 07/13/2011    HPI: Erin Castillo is a [place] woman the age of 35 was referred by [name] for evaluation of left breast carcinoma.  The patient noticed a dimple in the lateral left breast and was able to palpate a mass. She waited a couple of months since she is still premenopausal, but the mass did not resolve. She brought it to the attention of [name] and underwent bilateral diagnostic mammography and left ultrasonography at [facility] on 04/20/2011. Compression views as well standard CC and  MLO views showed a spiculated mass in the upper outer quadrant of the left breast measuring 2.1 cm. By ultrasound, this was a lobulated, irregular, and hypoechoic mass measuring 1.8 cm. No abnormal appearing lymph nodes in the axilla.  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 The tumor was ER positive at 98%, PR positive at 100%, HER-2/neu negative with a CISH ratio of 1.42, and an MIB-1 of 9%.  The patient underwent bilateral breast MRI on 10-12, confirming a 2.3 cm irregularly marginated, enhancing mass, deep in the upper outer quadrant of the left breast. This was close to the underlying pectoralis major, but without definite extension into the muscle. No other suspicious areas in either breast, and no abnormal appearing lymph nodes. An incidental 1.1 cm anterior liver cyst was noted.  The patient is status post left lumpectomy and sentinel lymph node sampling on 05/15/2011 under the care of [name]. 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 No evidence of lymphovascular invasion. One of one sentinel lymph nodes was involved however.  An Oncotype DX had already been requested, since initially the patient appeared to be node negative.  This showed the patient to be slightly on the low risk side.  Patient is known to be BRCA 1 and 2 negative.  The patient received one dose of docetaxel, doxorubicin, and cyclophosphamide on 06/22/2011 with poor tolerance. Upon review of the Oncotype results, especially when taking into consideration the patient's poor tolerance of the first cycle of chemotherapy, the decision was made to discontinue the doxorubicin, and proceed with 3 additional cycles of docetaxel and cyclophosphamide given on a Q. three-week basis, for a total of 4 cycles of adjuvant chemotherapy.  Interim History:   Erin Castillo returns today with her sister and her close friend Erin Castillo. After the patient's first cycle of chemotherapy she developed a 24-hour episode of diarrhea with fevers which took her to the emergency room. She was hydrated and released. She was not neutropenic at that time. She had temperatures up to 100.4 over a period of about a day. All labs resolved, currently she is having normal bowel movements, is back to work, and is eager to get on with treatment, with today being day 1 of her second of 4 planned cycles of chemotherapy.  Review of Systems: She has lost her hair. She has explored awakes and found that they are very expensive. She is planning to get one nevertheless. She has developed some pimples and I think she would benefit from suppressive treatment with doxycycline. That is discussed further below. Her vision is slightly affected. She is having minor problems with accommodation. That was discussed in detail as well. She had minor nausea after her first cycle, no vomiting. A detailed review of systems today was  otherwise negative.  Medications: I have reviewed the patient's current medications. Current Outpatient Prescriptions  Medication Sig Dispense Refill  . acetaminophen (TYLENOL) 325 MG tablet Take 650 mg by mouth every 6 (six) hours as needed.        Marland Kitchen b complex vitamins tablet Take 1 tablet  by mouth daily.        . citalopram (CELEXA) 20 MG tablet Take 20 mg by mouth daily.        Marland Kitchen dexamethasone (DECADRON) 4 MG tablet 2 tabs po BID x 5 days, beginning the day before each chemo dose  40 tablet  2  . lidocaine-prilocaine (EMLA) cream Apply 1 application topically daily as needed. For chemo       . loperamide (IMODIUM) 2 MG capsule Take 2 mg by mouth 4 (four) times daily as needed. For diarrhea       . LORazepam (ATIVAN) 0.5 MG tablet 1 tab po BID prn anxiety and/or nausea  30 tablet  0  . naproxen sodium (ANAPROX) 220 MG tablet Take 220 mg by mouth 2 (two) times daily with a meal.        . omeprazole (PRILOSEC) 20 MG capsule Take 20 mg by mouth daily as needed. For heartburn      . ondansetron (ZOFRAN) 8 MG tablet Take 8 mg by mouth every 12 (twelve) hours as needed.        . prochlorperazine (COMPAZINE) 10 MG tablet 1 tab po ACHS x 3 days after each chemo, then 1 tab PO q 6 hr prn nausea  30 tablet  3   PAST MEDICAL HISTORY:  Significant for remote sinus surgery, C-section x1, migraines occasionally occurring and associated with her period, mild anxiety/depression.  FAMILY HISTORY:  The patient's father is alive at age 52.  The patient's mother is alive at age 76.  The patient's father did have bladder cancer but has done well with that.  She has 3 sisters, no brothers.  There is no history of breast or ovarian cancer in the family.  GYNECOLOGIC HISTORY:  She had menarche at age 78.  Most recent period was September 15th.  Her periods are regular.  They last about 4 days of which 2 days are heavy.  The patient is GX, P3.  She understands that the fact that she did not deliver a child before the age of 31 does double her risk of breast cancer developing.  SOCIAL HISTORY:  She works as an Print production planner for [employer].  Her friend, Erin Castillo, who actually heads the board for [employer] is present today as well as one of the patient's sisters. The patient is  separated from her husband, Erin Castillo, who works at [employer].  They are not officially divorced, however.  She understands that currently unless she fills out a healthcare power of attorney, Erin Castillo would be her healthcare power of attorney if something happened and she was not able to tell us her medical wishes.  Children are Molli Hazard, age 78, Rayfield Citizen, age 14, and [name], age 3.  The patient attends [employer].  Allergies: No Known Allergies  Physical Exam: Filed Vitals:   07/13/11 1132  BP: 114/76  Pulse: 103  Temp: 98 F (36.7 C)   Body mass index is 25.28 kg/(m^2). ECOG: 0  HEENT:  Sclerae anicteric, conjunctivae pink.  Oropharynx clear.  No mucositis or candidiasis.   Nodes:  No cervical, supraclavicular, or axillary lymphadenopathy palpated.  Breast Exam:  Right breast  no suspicious findings. Left breast status post lumpectomy. There is no evidence of local recurrence.  Lungs:  Clear to auscultation bilaterally.  No crackles, rhonchi, or wheezes.   Heart:  Regular rate and rhythm.   Abdomen:  Soft, nontender.  Positive bowel sounds.  No organomegaly or masses palpated.   Musculoskeletal:  No focal spinal tenderness to palpation.  Extremities:  Benign.  No peripheral edema or cyanosis.   Skin:  The port is intact and appears easily accessible   Neuro:  Nonfocal.   Lab Results: Lab Results  Component Value Date   WBC 10.4* 07/13/2011   HGB 10.6* 07/13/2011   HCT 33.4* 07/13/2011   MCV 77.9* 07/13/2011   PLT 727* 07/13/2011   NEUTROABS 9.3* 07/13/2011     Chemistry      Component Value Date/Time   NA 138 06/30/2011 1330   K 3.6 06/30/2011 1330   CL 100 06/30/2011 1330   CO2 29 06/30/2011 1330   BUN 12 06/30/2011 1330   CREATININE 0.52 06/30/2011 1330      Component Value Date/Time   CALCIUM 9.0 06/30/2011 1330   ALKPHOS 58 06/30/2011 1330   AST 17 06/30/2011 1330   ALT 15 06/30/2011 1330   BILITOT 0.1* 06/30/2011 1330        Radiological Studies:  Dg  Chest Portable 1 View  06/12/2011  *RADIOLOGY REPORT*  Clinical Data: Port-A-Cath placement.  PORTABLE CHEST - 1 VIEW  Comparison: None.  Findings: 1117 hours.  Right subclavian power port tip is near the SVC right atrial junction.  There is no pneumothorax.  The lungs are clear.  Heart size and mediastinal contours are normal.  IMPRESSION: Right subclavian power port placement without demonstrated complication.  Original Report Authenticated By: Gerrianne Scale, M.D.   Dg Fluoro Guide Cv Line-no Report  06/12/2011  CLINICAL DATA: Portacath   FLOURO GUIDE CV LINE  Fluoroscopy was utilized by the requesting physician.  No radiographic  interpretation.       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 The tumor was grade 2, strongly estrogen and progesterone receptor positive, with an MIB-1 of 9% and no HER-2/neu amplification.   (2) Treated initially with docetaxel, doxorubicin, and cyclophosphamide for one cycle, with very poor tolerance  (3) her treatment plan has been changed to cyclophosphamide/docetaxel, for an additional three cycles   Plan:   We're proceeding with her first cycle of cyclophosphamide and docetaxel today, actually of course her second 04 planned cycles of chemotherapy. The only change I have made to her supportive medications is adding doxycycline to prevent worsening of the acne problem she is developing secondary to the steroids we're using. I have asked her to keep a diary of her symptoms. Hopefully she will not have the same problems with diarrhea and fever she had previously. She will see [place] again in followup in about a week, and then have her next cycle of treatment on January 10. All her orders and appointments are in place. She knows to call for any  problems that may develop before the next visit.  Lowella Dell, PA-C 07/13/2011

## 2011-07-14 ENCOUNTER — Ambulatory Visit (HOSPITAL_BASED_OUTPATIENT_CLINIC_OR_DEPARTMENT_OTHER): Payer: BC Managed Care – PPO

## 2011-07-14 ENCOUNTER — Ambulatory Visit: Payer: BC Managed Care – PPO

## 2011-07-14 VITALS — BP 107/73 | HR 91 | Temp 97.7°F

## 2011-07-14 DIAGNOSIS — Z5189 Encounter for other specified aftercare: Secondary | ICD-10-CM

## 2011-07-14 DIAGNOSIS — C50419 Malignant neoplasm of upper-outer quadrant of unspecified female breast: Secondary | ICD-10-CM

## 2011-07-14 MED ORDER — PEGFILGRASTIM INJECTION 6 MG/0.6ML
6.0000 mg | Freq: Once | SUBCUTANEOUS | Status: AC
  Administered 2011-07-14: 6 mg via SUBCUTANEOUS
  Filled 2011-07-14: qty 0.6

## 2011-07-14 MED ORDER — SODIUM CHLORIDE 0.9 % IV SOLN
INTRAVENOUS | Status: DC
  Administered 2011-07-14: 10:00:00 via INTRAVENOUS

## 2011-07-21 ENCOUNTER — Encounter: Payer: Self-pay | Admitting: Physician Assistant

## 2011-07-21 ENCOUNTER — Telehealth: Payer: Self-pay | Admitting: *Deleted

## 2011-07-21 ENCOUNTER — Other Ambulatory Visit (HOSPITAL_BASED_OUTPATIENT_CLINIC_OR_DEPARTMENT_OTHER): Payer: BC Managed Care – PPO | Admitting: Lab

## 2011-07-21 ENCOUNTER — Ambulatory Visit (HOSPITAL_BASED_OUTPATIENT_CLINIC_OR_DEPARTMENT_OTHER): Payer: BC Managed Care – PPO | Admitting: Physician Assistant

## 2011-07-21 VITALS — BP 122/76 | HR 97 | Temp 98.4°F | Ht 65.0 in | Wt 153.9 lb

## 2011-07-21 DIAGNOSIS — Z170 Estrogen receptor positive status [ER+]: Secondary | ICD-10-CM

## 2011-07-21 DIAGNOSIS — C50919 Malignant neoplasm of unspecified site of unspecified female breast: Secondary | ICD-10-CM

## 2011-07-21 DIAGNOSIS — C50419 Malignant neoplasm of upper-outer quadrant of unspecified female breast: Secondary | ICD-10-CM

## 2011-07-21 DIAGNOSIS — Z79899 Other long term (current) drug therapy: Secondary | ICD-10-CM

## 2011-07-21 LAB — CBC WITH DIFFERENTIAL/PLATELET
Basophils Absolute: 0 10*3/uL (ref 0.0–0.1)
EOS%: 0.3 % (ref 0.0–7.0)
HCT: 31.8 % — ABNORMAL LOW (ref 34.8–46.6)
HGB: 10.5 g/dL — ABNORMAL LOW (ref 11.6–15.9)
MCH: 25.9 pg (ref 25.1–34.0)
NEUT%: 94.8 % — ABNORMAL HIGH (ref 38.4–76.8)
lymph#: 1.7 10*3/uL (ref 0.9–3.3)

## 2011-07-21 NOTE — Progress Notes (Signed)
Hematology and Oncology Follow Up Visit  Erin Castillo [id number] 12-Nov-1965 45 y.o. 07/21/2011    HPI: Erin Castillo is a [place] woman the age of 64 was referred by [name] for evaluation of left breast carcinoma.  The patient noticed a dimple in the lateral left breast and was able to palpate a mass. She waited a couple of months since she is still premenopausal, but the mass did not resolve. She brought it to the attention of [name] and underwent bilateral diagnostic mammography and left ultrasonography at [facility] on 04/20/2011. Compression views as well standard CC and  MLO views showed a spiculated mass in the upper outer quadrant of the left breast measuring 2.1 cm. By ultrasound, this was a lobulated, irregular, and hypoechoic mass measuring 1.8 cm. No abnormal appearing lymph nodes in the axilla.  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 The tumor was ER positive at 98%, PR positive at 100%, HER-2/neu negative with a CISH ratio of 1.42, and an MIB-1 of 9%.  The patient underwent bilateral breast MRI on 10-12, confirming a 2.3 cm irregularly marginated, enhancing mass, deep in the upper outer quadrant of the left breast. This was close to the underlying pectoralis major, but without definite extension into the muscle. No other suspicious areas in either breast, and no abnormal appearing lymph nodes. An incidental 1.1 cm anterior liver cyst was noted.  The patient is status post left lumpectomy and sentinel lymph node sampling on 05/15/2011 under the care of [name]. 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 No evidence of lymphovascular invasion. One of one sentinel lymph nodes was involved however.  An Oncotype DX had already been requested, since initially the patient appeared to be node negative.  This showed the patient to be slightly on the low risk side.  Patient is known to be BRCA 1 and 2 negative.  The patient received one dose of docetaxel, doxorubicin, and cyclophosphamide on 06/22/2011 with poor tolerance. Upon review of the Oncotype results, especially when taking into consideration the patient's poor tolerance of the first cycle of chemotherapy, the decision was made to discontinue the doxorubicin, and proceed with 3 additional cycles of docetaxel and cyclophosphamide given on a Q. three-week basis, for a total of 4 cycles of adjuvant chemotherapy.  Interim History:   Patient returns today for assessment chemotoxicity on day 9 cycle 1 of 3 planned every 3 week doses of docetaxel/cyclophosphamide. This will be a total of 4 adjuvant cycles of chemotherapy, the original regimen of docetaxel/doxorubicin/cyclophosphamide discontinued after one dose due to poor tolerance. The patient has received Neulasta on day 2 for granulocyte support.  Overall [name] is feeling very well. She has had some mild bony aches and pains which have basically resolved. She's had no signs of numbness or tingling in the extremities. No skin changes, no bed changes, or nail bed sensitivity. No mouth ulcers or oral sensitivity. She's had some occasional nausea, treated effectively with her antinausea medication, and no emesis. No change in bowel habits. No excessive tearing. Patient has had some difficulty sleeping at times, despite the use of lorazepam.   A detailed review of systems is otherwise noncontributory as noted below.  Review of Systems: Constitutional:  no weight loss, fever, night sweats and feels well Eyes: negative QMV:[id number] Cardiovascular: no chest pain or dyspnea on exertion Respiratory: no cough, shortness  of breath, or wheezing Neurological: negative Dermatological: negative Gastrointestinal: no abdominal pain, change in bowel habits, or black or bloody stools Genito-Urinary: no  dysuria, trouble voiding, or hematuria Hematological and Lymphatic: negative Breast: negative Musculoskeletal: negative Remaining ROS negative.    Medications: I have reviewed the patient's current medications. Current Outpatient Prescriptions  Medication Sig Dispense Refill  . acetaminophen (TYLENOL) 325 MG tablet Take 650 mg by mouth every 6 (six) hours as needed.        Marland Kitchen b complex vitamins tablet Take 1 tablet by mouth daily.        . citalopram (CELEXA) 20 MG tablet Take 20 mg by mouth daily.        Marland Kitchen dexamethasone (DECADRON) 4 MG tablet 2 tabs po BID x 5 days, beginning the day before each chemo dose  40 tablet  2  . lidocaine-prilocaine (EMLA) cream Apply 1 application topically daily as needed. For chemo       . loperamide (IMODIUM) 2 MG capsule Take 2 mg by mouth 4 (four) times daily as needed. For diarrhea       . LORazepam (ATIVAN) 0.5 MG tablet 1 tab po BID prn anxiety and/or nausea  30 tablet  0  . naproxen sodium (ANAPROX) 220 MG tablet Take 220 mg by mouth 2 (two) times daily with a meal.        . omeprazole (PRILOSEC) 20 MG capsule Take 20 mg by mouth daily as needed. For heartburn      . ondansetron (ZOFRAN) 8 MG tablet Take 8 mg by mouth every 12 (twelve) hours as needed.        . prochlorperazine (COMPAZINE) 10 MG tablet 1 tab po ACHS x 3 days after each chemo, then 1 tab PO q 6 hr prn nausea  30 tablet  3   PAST MEDICAL HISTORY:  Significant for remote sinus surgery, C-section x1, migraines occasionally occurring and associated with her period, mild anxiety/depression.  FAMILY HISTORY:  The patient's father is alive at age 4.  The patient's mother is alive at age 26.  The patient's father did have bladder cancer but has done well with that.  She has 3 sisters, no brothers.  There is no history of breast or ovarian cancer in the family.  GYNECOLOGIC HISTORY:  She had menarche at age 33.  Most recent period was September 15th.  Her periods are regular.  They last about 4  days of which 2 days are heavy.  The patient is GX, P3.  She understands that the fact that she did not deliver a child before the age of 30 does double her risk of breast cancer developing.  SOCIAL HISTORY:  She works as an Print production planner for [employer].  Her friend, [name], who actually heads the board for [employer], often accompanies Nasira to her appointments. The patient is separated from her husband, [name], who works at [employer].  They are not officially divorced, however.  She understands that currently unless she fills out a healthcare power of attorney, [name] would be her healthcare power of attorney if something happened and she was not able to tell us her medical wishes.  Children are Molli Hazard, age 25, Rayfield Citizen, age 73, and [name], age 102.  The patient attends [employer].  Allergies: No Known Allergies  Physical Exam: Filed Vitals:   07/21/11 1505  BP: 122/76  Pulse: 97  Temp: 98.4 F (36.9 C)   Body mass index is 25.61 kg/(m^2).  ECOG: 0  HEENT:  Sclerae anicteric, conjunctivae pink.  Oropharynx clear.  No mucositis or candidiasis.   Nodes:  No cervical, supraclavicular, or axillary lymphadenopathy palpated.  Breast Exam:  Deferred. Lungs:  Clear to auscultation bilaterally.  No crackles, rhonchi, or wheezes.   Heart:  Regular rate and rhythm.   Abdomen:  Soft, nontender.  Positive bowel sounds.  No organomegaly or masses palpated.   Musculoskeletal:  No focal spinal tenderness to palpation.  Extremities:  Benign.  No peripheral edema or cyanosis.   Skin:  The port is intact and appears easily accessible .  Skin is benign. Neuro:  Nonfocal.   Lab Results: Lab Results  Component Value Date   WBC 51.0* 07/21/2011   HGB 10.5* 07/21/2011   HCT 31.8* 07/21/2011   MCV 78.5* 07/21/2011   PLT 365 07/21/2011   NEUTROABS 48.3* 07/21/2011     Chemistry      Component Value Date/Time   NA 138 07/13/2011 1105   K 4.0 07/13/2011 1105    CL 105 07/13/2011 1105   CO2 26 07/13/2011 1105   BUN 20 07/13/2011 1105   CREATININE 0.50 07/13/2011 1105      Component Value Date/Time   CALCIUM 9.5 07/13/2011 1105   ALKPHOS 58 07/13/2011 1105   AST 26 07/13/2011 1105   ALT 37* 07/13/2011 1105   BILITOT 0.2* 07/13/2011 1105      A ferritin level is pending on 07/21/11.  Radiological Studies:  Dg Chest Portable 1 View  06/12/2011  *RADIOLOGY REPORT*  Clinical Data: Port-A-Cath placement.  PORTABLE CHEST - 1 VIEW  Comparison: None.  Findings: 1117 hours.  Right subclavian power port tip is near the SVC right atrial junction.  There is no pneumothorax.  The lungs are clear.  Heart size and mediastinal contours are normal.  IMPRESSION: Right subclavian power port placement without demonstrated complication.  Original Report Authenticated By: Gerrianne Scale, M.D.   Dg Fluoro Guide Cv Line-no Report  06/12/2011  CLINICAL DATA: Portacath   FLOURO GUIDE CV LINE  Fluoroscopy was utilized by the requesting physician.  No radiographic  interpretation.       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 The tumor was grade 2, strongly estrogen and progesterone receptor positive, with an MIB-1 of 9% and no HER-2/neu amplification.   (2) Treated initially with docetaxel, doxorubicin, and cyclophosphamide for one cycle, with very poor tolerance  (3) her treatment plan has been changed to cyclophosphamide/docetaxel, for an additional three cycles   Plan:  The patient has tolerated this first cycle of docetaxel/cyclophosphamide very well. Again the plan will be to complete 3 cycles of docetaxel/cyclophosphamide, for total of 4 adjuvant cycles of chemotherapy overall.  I suggested that the patient continue lorazepam at night,  or perhaps try Benadryl or Tylenol PM to help her sleep. She will let us know if this is not helpful.  I will see patient back in 2 weeks, January 10, in anticipation of her second dose of docetaxel/cyclophosphamide. Her final dose of adjuvant chemotherapy is scheduled for January 31, after which she will followup with Dr. Darnelle Catalan and Dr. Dayton Scrape, ready at that point to initiate radiation therapy.  This case was reviewed with the patient who voices understanding and agreement with our plan.  She will call any changes or problems prior to her next appointment.  Cana Mignano, PA-C 07/21/2011

## 2011-07-21 NOTE — Telephone Encounter (Signed)
gave patient appointment for 08-30-2010 with magrinat and Dayton Scrape printed out calendar and gave to the patient

## 2011-08-03 ENCOUNTER — Ambulatory Visit: Payer: BC Managed Care – PPO | Admitting: Physician Assistant

## 2011-08-03 ENCOUNTER — Telehealth: Payer: Self-pay | Admitting: Oncology

## 2011-08-03 ENCOUNTER — Ambulatory Visit (HOSPITAL_BASED_OUTPATIENT_CLINIC_OR_DEPARTMENT_OTHER): Payer: BC Managed Care – PPO

## 2011-08-03 ENCOUNTER — Other Ambulatory Visit: Payer: BC Managed Care – PPO | Admitting: Lab

## 2011-08-03 ENCOUNTER — Encounter: Payer: Self-pay | Admitting: Physician Assistant

## 2011-08-03 VITALS — BP 105/70 | HR 90 | Temp 98.5°F | Ht 65.0 in | Wt 156.6 lb

## 2011-08-03 DIAGNOSIS — C50919 Malignant neoplasm of unspecified site of unspecified female breast: Secondary | ICD-10-CM

## 2011-08-03 DIAGNOSIS — C50419 Malignant neoplasm of upper-outer quadrant of unspecified female breast: Secondary | ICD-10-CM

## 2011-08-03 LAB — COMPREHENSIVE METABOLIC PANEL
CO2: 27 mEq/L (ref 19–32)
Creatinine, Ser: 0.64 mg/dL (ref 0.50–1.10)
Glucose, Bld: 93 mg/dL (ref 70–99)
Total Bilirubin: 0.3 mg/dL (ref 0.3–1.2)

## 2011-08-03 LAB — CBC WITH DIFFERENTIAL/PLATELET
Basophils Absolute: 0 10*3/uL (ref 0.0–0.1)
Eosinophils Absolute: 0 10*3/uL (ref 0.0–0.5)
HGB: 9.9 g/dL — ABNORMAL LOW (ref 11.6–15.9)
LYMPH%: 12.1 % — ABNORMAL LOW (ref 14.0–49.7)
MCV: 77.8 fL — ABNORMAL LOW (ref 79.5–101.0)
MONO%: 14.4 % — ABNORMAL HIGH (ref 0.0–14.0)
NEUT#: 6.6 10*3/uL — ABNORMAL HIGH (ref 1.5–6.5)
NEUT%: 73 % (ref 38.4–76.8)
Platelets: 288 10*3/uL (ref 145–400)

## 2011-08-03 MED ORDER — SODIUM CHLORIDE 0.9 % IV SOLN
600.0000 mg/m2 | Freq: Once | INTRAVENOUS | Status: AC
  Administered 2011-08-03: 1080 mg via INTRAVENOUS
  Filled 2011-08-03: qty 54

## 2011-08-03 MED ORDER — ONDANSETRON 16 MG/50ML IVPB (CHCC)
16.0000 mg | Freq: Once | INTRAVENOUS | Status: AC
  Administered 2011-08-03: 16 mg via INTRAVENOUS

## 2011-08-03 MED ORDER — DEXAMETHASONE SODIUM PHOSPHATE 4 MG/ML IJ SOLN
20.0000 mg | Freq: Once | INTRAMUSCULAR | Status: AC
  Administered 2011-08-03: 20 mg via INTRAVENOUS

## 2011-08-03 MED ORDER — HEPARIN SOD (PORK) LOCK FLUSH 100 UNIT/ML IV SOLN
500.0000 [IU] | Freq: Once | INTRAVENOUS | Status: AC | PRN
  Administered 2011-08-03: 500 [IU]
  Filled 2011-08-03: qty 5

## 2011-08-03 MED ORDER — LORAZEPAM 0.5 MG PO TABS: ORAL_TABLET | ORAL | Status: DC

## 2011-08-03 MED ORDER — SODIUM CHLORIDE 0.9 % IV SOLN: Freq: Once | INTRAVENOUS | Status: DC

## 2011-08-03 MED ORDER — SODIUM CHLORIDE 0.9 % IJ SOLN
10.0000 mL | INTRAMUSCULAR | Status: DC | PRN
  Administered 2011-08-03: 10 mL
  Filled 2011-08-03: qty 10

## 2011-08-03 MED ORDER — DOCETAXEL CHEMO INJECTION 160 MG/16ML
75.0000 mg/m2 | Freq: Once | INTRAVENOUS | Status: AC
  Administered 2011-08-03: 130 mg via INTRAVENOUS
  Filled 2011-08-03: qty 13

## 2011-08-03 NOTE — Progress Notes (Signed)
Hematology and Oncology Follow Up Visit  Erin Castillo [id number] 06/15/1966 46 y.o. 08/03/2011    HPI: Erin Castillo is a  woman the age of 25 was referred by [name] for evaluation of left breast carcinoma.  The patient noticed a dimple in the lateral left breast and was able to palpate a mass. She waited a couple of months since she is still premenopausal, but the mass did not resolve. She brought it to the attention of [name] and underwent bilateral diagnostic mammography and left ultrasonography at [facility] on 04/20/2011. Compression views as well standard CC and  MLO views showed a spiculated mass in the upper outer quadrant of the left breast measuring 2.1 cm. By ultrasound, this was a lobulated, irregular, and hypoechoic mass measuring 1.8 cm. No abnormal appearing lymph nodes in the axilla.  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 The tumor was ER positive at 98%, PR positive at 100%, HER-2/neu negative with a CISH ratio of 1.42, and an MIB-1 of 9%.  The patient underwent bilateral breast MRI on 10-12, confirming a 2.3 cm irregularly marginated, enhancing mass, deep in the upper outer quadrant of the left breast. This was close to the underlying pectoralis major, but without definite extension into the muscle. No other suspicious areas in either breast, and no abnormal appearing lymph nodes. An incidental 1.1 cm anterior liver cyst was noted.  The patient is status post left lumpectomy and sentinel lymph node sampling on 05/15/2011 under the care of [name]. 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 No evidence of lymphovascular invasion. One of one sentinel lymph nodes was involved however.  An Oncotype DX had already been requested, since initially the patient appeared to be node negative.  This showed the patient to be slightly on the low risk side.  Patient is known to be BRCA 1 and 2 negative.  The patient received one dose of docetaxel, doxorubicin, and cyclophosphamide on 06/22/2011 with poor tolerance. Upon review of the Oncotype results, especially when taking into consideration the patient's poor tolerance of the first cycle of chemotherapy, the decision was made to discontinue the doxorubicin, and proceed with 3 additional cycles of docetaxel and cyclophosphamide given on a Q. three-week basis, for a total of 4 cycles of adjuvant chemotherapy.  Interim History:   Patient returns today for followup of her left breast carcinoma, due for day 1 cycle 2 of 3 planned every 3 week doses of docetaxel/cyclophosphamide. This will be a total of 4 adjuvant cycles of chemotherapy, the original regimen of docetaxel/doxorubicin/cyclophosphamide discontinued after one dose due to poor tolerance. The patient has received Neulasta on day 2 for granulocyte support. She also receive supportive IV fluids on day 2 as well.  Overall Leisure is feeling very well with very few complaints. Her energy level is good. She's eating well, in fact her appetite is increased. She's had no problems with nausea, and no change in bowel habits. She denies any signs of peripheral neuropathy. No mouth ulcers or oral sensitivity. No skin changes or nail bed changes. No excessive tearing. She does have some difficulty sleeping at times, for which she utilizes lorazepam appropriately and affectively. She would like this refill today.  A detailed review of systems is otherwise noncontributory as noted below.  Review of Systems: Constitutional:  no weight loss, fever, night sweats and feels well  Eyes: negative [id number] Cardiovascular: no chest pain or dyspnea on exertion Respiratory: no cough, shortness of breath, or wheezing Neurological: negative Dermatological: negative Gastrointestinal: no abdominal pain,  nausea, change in bowel habits, or black or bloody stools Genito-Urinary: no dysuria, trouble voiding, or hematuria Hematological and Lymphatic: negative Breast: negative Musculoskeletal: negative Remaining ROS negative.    Medications: I have reviewed the patient's current medications. Current Outpatient Prescriptions  Medication Sig Dispense Refill  . citalopram (CELEXA) 20 MG tablet Take 20 mg by mouth daily.        Marland Kitchen dexamethasone (DECADRON) 4 MG tablet 2 tabs po BID x 5 days, beginning the day before each chemo dose  40 tablet  2  . lidocaine-prilocaine (EMLA) cream Apply 1 application topically daily as needed. For chemo       . loperamide (IMODIUM) 2 MG capsule Take 2 mg by mouth 4 (four) times daily as needed. For diarrhea       . LORazepam (ATIVAN) 0.5 MG tablet 1 tab po BID prn anxiety and/or nausea  30 tablet  0  . naproxen sodium (ANAPROX) 220 MG tablet Take 220 mg by mouth 2 (two) times daily with a meal.        . omeprazole (PRILOSEC) 20 MG capsule Take 20 mg by mouth daily as needed. For heartburn      . ondansetron (ZOFRAN) 8 MG tablet Take 8 mg by mouth every 12 (twelve) hours as needed.        . prochlorperazine (COMPAZINE) 10 MG tablet 1 tab po ACHS x 3 days after each chemo, then 1 tab PO q 6 hr prn nausea  30 tablet  3  . acetaminophen (TYLENOL) 325 MG tablet Take 650 mg by mouth every 6 (six) hours as needed.        Marland Kitchen b complex vitamins tablet Take 1 tablet by mouth daily.         No current facility-administered medications for this visit.   Facility-Administered Medications Ordered in Other Visits  Medication Dose Route Frequency Provider Last Rate Last Dose  . 0.9 %  sodium chloride infusion   Intravenous Once Zollie Scale, PA      . cyclophosphamide (CYTOXAN) 1,080 mg in sodium chloride 0.9 % 250 mL chemo infusion  600 mg/m2 (Treatment Plan Actual) Intravenous Once Veola Cafaro, PA      . dexamethasone (DECADRON) injection 20 mg  20 mg Intravenous Once Kattia Selley, PA    20 mg at 08/03/11 1245  . DOCEtaxel (TAXOTERE) 130 mg in dextrose 5 % 250 mL chemo infusion  75 mg/m2 (Treatment Plan Actual) Intravenous Once Zollie Scale, PA 263 mL/hr at 08/03/11 1309 130 mg at 08/03/11 1309  . heparin lock flush 100 unit/mL  500 Units Intracatheter Once PRN Julanne Schlueter, PA      . ondansetron (ZOFRAN) IVPB 16 mg  16 mg Intravenous Once Jayesh Marbach, PA   16 mg at 08/03/11 1245  . sodium chloride 0.9 % injection 10 mL  10 mL Intracatheter PRN Zollie Scale, PA       PAST MEDICAL HISTORY:  Significant for remote sinus surgery, C-section x1, migraines occasionally occurring and associated with her period, mild anxiety/depression.  FAMILY HISTORY:  The patient's father is alive at age 33.  The patient's mother is alive at age 14.  The patient's father did have bladder cancer but has done well with that.  She has 3 sisters, no brothers.  There is no history of breast or ovarian cancer  in the family.  GYNECOLOGIC HISTORY:  She had menarche at age 47.  Most recent period was September 15th.  Her periods are regular.  They last about 4 days of which 2 days are heavy.  The patient is GX, P3.  She understands that the fact that she did not deliver a child before the age of 32 does double her risk of breast cancer developing.  SOCIAL HISTORY:  She works as an Print production planner for [employer].  Her friend, [name], who actually heads the board for [employer], often accompanies Amarria to her appointments. The patient is separated from her husband, [name], who works at [employer].  They are not officially divorced, however.  She understands that currently unless she fills out a healthcare power of attorney, [name] would be her healthcare power of attorney if something happened and she was not able to tell us her medical wishes.  Children are Molli Hazard, age 21, Rayfield Citizen, age 22, and [name], age 72.  The patient attends [employer].  Allergies: No Known Allergies  Physical  Exam: Filed Vitals:   08/03/11 1157  BP: 105/70  Pulse: 90  Temp: 98.5 F (36.9 C)   Body mass index is 26.06 kg/(m^2). ECOG: 0  HEENT:  Sclerae anicteric, conjunctivae pink.  Oropharynx clear.  No mucositis or candidiasis.   Nodes:  No cervical, supraclavicular, or axillary lymphadenopathy palpated.  Breast Exam:  Deferred. Lungs:  Clear to auscultation bilaterally.  No crackles, rhonchi, or wheezes.   Heart:  Regular rate and rhythm.   Abdomen:  Soft, nontender.  Positive bowel sounds.  No organomegaly or masses palpated.   Musculoskeletal:  No focal spinal tenderness to palpation.  Extremities:  Benign.  No peripheral edema or cyanosis.   Skin:  Benign. Neuro:  Nonfocal.   Lab Results: Lab Results  Component Value Date   WBC 9.1 08/03/2011   HGB 9.9* 08/03/2011   HCT 30.5* 08/03/2011   MCV 77.8* 08/03/2011   PLT 288 08/03/2011   NEUTROABS 6.6* 08/03/2011     Chemistry      Component Value Date/Time   NA 138 08/03/2011 1134   K 4.3 08/03/2011 1134   CL 103 08/03/2011 1134   CO2 27 08/03/2011 1134   BUN 13 08/03/2011 1134   CREATININE 0.64 08/03/2011 1134      Component Value Date/Time   CALCIUM 9.4 08/03/2011 1134   ALKPHOS 58 08/03/2011 1134   AST 19 08/03/2011 1134   ALT 46* 08/03/2011 1134   BILITOT 0.3 08/03/2011 1134      A ferritin level was normal at 202 on 07/21/11.  Radiological Studies:  Dg Chest Portable 1 View  06/12/2011  *RADIOLOGY REPORT*  Clinical Data: Port-A-Cath placement.  PORTABLE CHEST - 1 VIEW  Comparison: None.  Findings: 1117 hours.  Right subclavian power port tip is near the SVC right atrial junction.  There is no pneumothorax.  The lungs are clear.  Heart size and mediastinal contours are normal.  IMPRESSION: Right subclavian power port placement without demonstrated complication.  Original Report Authenticated By: Gerrianne Scale, M.D.   Dg Fluoro Guide Cv Line-no Report  06/12/2011  CLINICAL DATA: Portacath   FLOURO GUIDE CV LINE   Fluoroscopy was utilized by the requesting physician.  No radiographic  interpretation.       Assessment:  46 year old BRCA 1-2 negative [place] woman   (1)  Status post left lumpectomy and sentinel lymph node biopsy October of 2012 for a 2.4  cm invasive ductal carcinoma, grade 2, involving the single sentinel lymph node, and so T2 N1 or stage IIb. The tumor was grade 2, strongly estrogen and progesterone receptor positive, with an MIB-1 of 9% and no HER-2/neu amplification.   (2) Treated initially with docetaxel, doxorubicin, and cyclophosphamide for one cycle, with very poor tolerance  (3) her treatment plan has been changed to cyclophosphamide/docetaxel, for an additional three cycles   Plan:  Mesha will proceed to treatment today as scheduled for her second of 3 planned q. three-week doses of docetaxel/cyclophosphamide. She return tomorrow for her Neulasta injection in addition to supportive IV fluids. I will see her next week on January 17 for assessment of chemotoxicity. I will note that she is premedicated with the dexamethasone, knows to call her antiemetics appropriately.  Her final dose of adjuvant chemotherapy is scheduled for January 31, after which she will followup with Dr. Darnelle Catalan and Dr. Dayton Scrape, ready at that point to initiate radiation therapy.  This case was reviewed with the patient who voices understanding and agreement with our plan. She will call any changes or problems prior to her next appointment.  Keayra Graham, PA-C 08/03/2011

## 2011-08-03 NOTE — Telephone Encounter (Signed)
Gv pt appt for [id number] °

## 2011-08-04 ENCOUNTER — Ambulatory Visit: Payer: BC Managed Care – PPO

## 2011-08-04 ENCOUNTER — Ambulatory Visit (HOSPITAL_BASED_OUTPATIENT_CLINIC_OR_DEPARTMENT_OTHER): Payer: BC Managed Care – PPO

## 2011-08-04 VITALS — BP 85/63 | HR 81 | Temp 97.0°F

## 2011-08-04 DIAGNOSIS — Z5189 Encounter for other specified aftercare: Secondary | ICD-10-CM

## 2011-08-04 DIAGNOSIS — C50419 Malignant neoplasm of upper-outer quadrant of unspecified female breast: Secondary | ICD-10-CM

## 2011-08-04 MED ORDER — SODIUM CHLORIDE 0.9 % IV SOLN
INTRAVENOUS | Status: DC
  Administered 2011-08-04: 10:00:00 via INTRAVENOUS

## 2011-08-04 MED ORDER — PEGFILGRASTIM INJECTION 6 MG/0.6ML
6.0000 mg | Freq: Once | SUBCUTANEOUS | Status: AC
  Administered 2011-08-04: 6 mg via SUBCUTANEOUS
  Filled 2011-08-04: qty 0.6

## 2011-08-07 ENCOUNTER — Other Ambulatory Visit: Payer: Self-pay | Admitting: Certified Registered Nurse Anesthetist

## 2011-08-10 ENCOUNTER — Other Ambulatory Visit (HOSPITAL_BASED_OUTPATIENT_CLINIC_OR_DEPARTMENT_OTHER): Payer: BC Managed Care – PPO

## 2011-08-10 ENCOUNTER — Encounter: Payer: Self-pay | Admitting: Physician Assistant

## 2011-08-10 ENCOUNTER — Ambulatory Visit (HOSPITAL_BASED_OUTPATIENT_CLINIC_OR_DEPARTMENT_OTHER): Payer: BC Managed Care – PPO | Admitting: Physician Assistant

## 2011-08-10 VITALS — BP 100/63 | HR 103 | Temp 98.6°F | Ht 65.0 in | Wt 155.6 lb

## 2011-08-10 DIAGNOSIS — Z170 Estrogen receptor positive status [ER+]: Secondary | ICD-10-CM

## 2011-08-10 DIAGNOSIS — C50419 Malignant neoplasm of upper-outer quadrant of unspecified female breast: Secondary | ICD-10-CM

## 2011-08-10 DIAGNOSIS — Z79899 Other long term (current) drug therapy: Secondary | ICD-10-CM

## 2011-08-10 DIAGNOSIS — K5900 Constipation, unspecified: Secondary | ICD-10-CM

## 2011-08-10 DIAGNOSIS — C50919 Malignant neoplasm of unspecified site of unspecified female breast: Secondary | ICD-10-CM

## 2011-08-10 LAB — CBC WITH DIFFERENTIAL/PLATELET
BASO%: 0.5 % (ref 0.0–2.0)
Basophils Absolute: 0 10*3/uL (ref 0.0–0.1)
EOS%: 2.1 % (ref 0.0–7.0)
Eosinophils Absolute: 0.2 10*3/uL (ref 0.0–0.5)
HCT: 30 % — ABNORMAL LOW (ref 34.8–46.6)
HGB: 10.1 g/dL — ABNORMAL LOW (ref 11.6–15.9)
LYMPH%: 11.8 % — ABNORMAL LOW (ref 14.0–49.7)
MCH: 26.6 pg (ref 25.1–34.0)
MCHC: 33.7 g/dL (ref 31.5–36.0)
MCV: 78.8 fL — ABNORMAL LOW (ref 79.5–101.0)
MONO#: 1.9 10*3/uL — ABNORMAL HIGH (ref 0.1–0.9)
MONO%: 21.7 % — ABNORMAL HIGH (ref 0.0–14.0)
NEUT#: 5.5 10*3/uL (ref 1.5–6.5)
NEUT%: 63.9 % (ref 38.4–76.8)
Platelets: 296 10*3/uL (ref 145–400)
RBC: 3.8 10*6/uL (ref 3.70–5.45)
RDW: 22.5 % — ABNORMAL HIGH (ref 11.2–14.5)
WBC: 8.7 10*3/uL (ref 3.9–10.3)
lymph#: 1 10*3/uL (ref 0.9–3.3)

## 2011-08-10 NOTE — Progress Notes (Signed)
Hematology and Oncology Follow Up Visit  Erin Castillo [id number] 03/04/1966 46 y.o. 08/10/2011    HPI: Erin Castillo is a [place] woman the age of 87 was referred by [name] for evaluation of left breast carcinoma.  The patient noticed a dimple in the lateral left breast and was able to palpate a mass. She waited a couple of months since she is still premenopausal, but the mass did not resolve. She brought it to the attention of [name] and underwent bilateral diagnostic mammography and left ultrasonography at [facility] on 04/20/2011. Compression views as well standard CC and  MLO views showed a spiculated mass in the upper outer quadrant of the left breast measuring 2.1 cm. By ultrasound, this was a lobulated, irregular, and hypoechoic mass measuring 1.8 cm. No abnormal appearing lymph nodes in the axilla.  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 The tumor was ER positive at 98%, PR positive at 100%, HER-2/neu negative with a CISH ratio of 1.42, and an MIB-1 of 9%.  The patient underwent bilateral breast MRI on 10-12, confirming a 2.3 cm irregularly marginated, enhancing mass, deep in the upper outer quadrant of the left breast. This was close to the underlying pectoralis major, but without definite extension into the muscle. No other suspicious areas in either breast, and no abnormal appearing lymph nodes. An incidental 1.1 cm anterior liver cyst was noted.  The patient is status post left lumpectomy and sentinel lymph node sampling on 05/15/2011 under the care of [name]. 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 No evidence of lymphovascular invasion. One of one sentinel lymph nodes was involved however.  An Oncotype DX had already been requested, since initially the patient appeared to be node negative.  This showed the patient to be slightly on the low risk side.  Patient is known to be BRCA 1 and 2 negative.  The patient received one dose of docetaxel, doxorubicin, and cyclophosphamide on 06/22/2011 with poor tolerance. Upon review of the Oncotype results, especially when taking into consideration the patient's poor tolerance of the first cycle of chemotherapy, the decision was made to discontinue the doxorubicin, and proceed with 3 additional cycles of docetaxel and cyclophosphamide given on a Q. three-week basis, for a total of 4 cycles of adjuvant chemotherapy.  Interim History:   Patient returns today for followup of her left breast carcinoma, currently day 8, cycle 2 of 3 planned every 3 week doses of docetaxel/cyclophosphamide. This will be a total of 4 adjuvant cycles of chemotherapy, the original regimen of docetaxel/doxorubicin/cyclophosphamide discontinued after one dose due to poor tolerance. The patient received Neulasta on day 2 for granulocyte support. She also received supportive IV fluids on day 2 as well.  Erin Castillo  is tired today. Her biggest complaint was some constipation over the weekend. She tells me she spent the majority of Saturday, Sunday, and Monday in the bed. By Monday she is extremely constipated and took 2 tablets of Dulcolax. By Tuesday she was having "explosive" bowel movements with abdominal cramping. Her abdomen, in fact, is still "a little sore". Her bowels are becoming more normalized at this point. She's had no blood or mucus in the stool. No fevers, chills, or night sweats. She had some mild nausea, associated with constipation, but no emesis.  Otherwise, Torii denies any signs of peripheral neuropathy. No skin changes, no bed  changes, or nail bed sensitivity. No mouth ulcers or oral sensitivity. No excessive tearing.  A detailed review of systems is otherwise noncontributory as noted below.  Review of Systems: Constitutional:  Fatigue, no weight loss, fever,  night sweats Eyes: negative [id number] Cardiovascular: no chest pain or dyspnea on exertion Respiratory: no cough, shortness of breath, or wheezing Neurological: negative Dermatological: negative Gastrointestinal: Recent constipation, resolved. Abdominal pain, mild nausea, no dark or tarry stools or abnormal bleeding.  Genito-Urinary: no dysuria, trouble voiding, or hematuria Hematological and Lymphatic: negative Breast: negative Musculoskeletal: negative Remaining ROS negative.    Medications: I have reviewed the patient's current medications. Current Outpatient Prescriptions  Medication Sig Dispense Refill  . acetaminophen (TYLENOL) 325 MG tablet Take 650 mg by mouth every 6 (six) hours as needed.        Marland Kitchen b complex vitamins tablet Take 1 tablet by mouth daily.        . citalopram (CELEXA) 20 MG tablet Take 20 mg by mouth daily.        Marland Kitchen dexamethasone (DECADRON) 4 MG tablet 2 tabs po BID x 5 days, beginning the day before each chemo dose  40 tablet  2  . lidocaine-prilocaine (EMLA) cream Apply 1 application topically daily as needed. For chemo       . LORazepam (ATIVAN) 0.5 MG tablet 1 tab po BID prn anxiety and/or nausea  30 tablet  0  . naproxen sodium (ANAPROX) 220 MG tablet Take 220 mg by mouth 2 (two) times daily with a meal.        . omeprazole (PRILOSEC) 20 MG capsule Take 20 mg by mouth daily as needed. For heartburn      . ondansetron (ZOFRAN) 8 MG tablet Take 8 mg by mouth every 12 (twelve) hours as needed.        . prochlorperazine (COMPAZINE) 10 MG tablet 1 tab po ACHS x 3 days after each chemo, then 1 tab PO q 6 hr prn nausea  30 tablet  3  . docusate sodium (COLACE) 100 MG capsule Take 100 mg by mouth daily as needed.      . loperamide (IMODIUM) 2 MG capsule Take 2 mg by mouth 4 (four) times daily as needed. For diarrhea        PAST MEDICAL HISTORY:  Significant for remote sinus surgery, C-section x1, migraines occasionally occurring and associated with her period,  mild anxiety/depression.  FAMILY HISTORY:  The patient's father is alive at age 56.  The patient's mother is alive at age 8.  The patient's father did have bladder cancer but has done well with that.  She has 3 sisters, no brothers.  There is no history of breast or ovarian cancer in the family.  GYNECOLOGIC HISTORY:  She had menarche at age 45.  Most recent period was September 15th.  Her periods are regular.  They last about 4 days of which 2 days are heavy.  The patient is GX, P3.  She understands that the fact that she did not deliver a child before the age of 63 does double her risk of breast cancer developing.  SOCIAL HISTORY:  She works as an Print production planner for [employer].  Her friend, [name], who actually heads the board for [employer], often accompanies Jesseka to her appointments. The patient is separated from her husband, [name], who works at [employer].  They are not officially divorced, however.  She understands that currently unless she fills out a healthcare power of  attorney, [name] would be her healthcare power of attorney if something happened and she was not able to tell us her medical wishes.  Children are Molli Hazard, age 62, Rayfield Citizen, age 70, and [name], age 50.  The patient attends [employer].  Allergies: No Known Allergies  Physical Exam: Filed Vitals:   08/10/11 0928  BP: 100/63  Pulse: 103  Temp: 98.6 F (37 C)   Body mass index is 25.89 kg/(m^2). ECOG: 0  HEENT:  Sclerae anicteric, conjunctivae pink.  Oropharynx clear.  No mucositis or candidiasis.   Nodes:  No cervical, supraclavicular, or axillary lymphadenopathy palpated.  Breast Exam:  Deferred. Lungs:  Clear to auscultation bilaterally.  No crackles, rhonchi, or wheezes.   Heart:  Regular rate and rhythm.   Abdomen:  Soft, mildly tender to palpation.  Positive bowel sounds.  No organomegaly or masses palpated.   Musculoskeletal:  No focal spinal tenderness to palpation.    Extremities:  Benign.  No peripheral edema or cyanosis.   Skin:  Benign. Neuro:  Nonfocal.   Lab Results: Lab Results  Component Value Date   WBC 8.7 08/10/2011   HGB 10.1* 08/10/2011   HCT 30.0* 08/10/2011   MCV 78.8* 08/10/2011   PLT 296 08/10/2011   NEUTROABS 5.5 08/10/2011     Chemistry      Component Value Date/Time   NA 138 08/03/2011 1134   K 4.3 08/03/2011 1134   CL 103 08/03/2011 1134   CO2 27 08/03/2011 1134   BUN 13 08/03/2011 1134   CREATININE 0.64 08/03/2011 1134      Component Value Date/Time   CALCIUM 9.4 08/03/2011 1134   ALKPHOS 58 08/03/2011 1134   AST 19 08/03/2011 1134   ALT 46* 08/03/2011 1134   BILITOT 0.3 08/03/2011 1134      A ferritin level was normal at 202 on 07/21/11.  Radiological Studies:  Dg Chest Portable 1 View  06/12/2011  *RADIOLOGY REPORT*  Clinical Data: Port-A-Cath placement.  PORTABLE CHEST - 1 VIEW  Comparison: None.  Findings: 1117 hours.  Right subclavian power port tip is near the SVC right atrial junction.  There is no pneumothorax.  The lungs are clear.  Heart size and mediastinal contours are normal.  IMPRESSION: Right subclavian power port placement without demonstrated complication.  Original Report Authenticated By: Gerrianne Scale, M.D.   Dg Fluoro Guide Cv Line-no Report  06/12/2011  CLINICAL DATA: Portacath   FLOURO GUIDE CV LINE  Fluoroscopy was utilized by the requesting physician.  No radiographic  interpretation.       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 The tumor was grade 2, strongly estrogen and progesterone receptor positive, with an MIB-1 of 9% and no HER-2/neu amplification.   (2) Treated initially with docetaxel, doxorubicin, and cyclophosphamide for one cycle, with very poor tolerance  (3)  her treatment plan has been changed to cyclophosphamide/docetaxel, for an additional three cycles   Plan:  Rayfield will return in 2 weeks, January 31, for her final dose of adjuvant chemotherapy, day 1 cycle 3 of docetaxel/cyclophosphamide. She is already scheduled for both her Neulasta injection and supportive IV fluids on day 2. She will see Dr. Darnelle Catalan and Dr. Dayton Scrape following her last dose of chemotherapy, ready at that point  to initiate radiation therapy as planned.  We did discuss a bowel regimen to prevent constipation with cycle 3, specifically utilizing Colace and/or MiraLAX if needed.  This case was reviewed with the patient who voices understanding and agreement with our plan. She will call any changes or problems prior to her next appointment.  Alfredo Collymore, PA-C 08/10/2011

## 2011-08-11 ENCOUNTER — Encounter: Payer: Self-pay | Admitting: Oncology

## 2011-08-11 NOTE — Progress Notes (Signed)
Patient approve [employer], 08/11/11 - 02/08/12

## 2011-08-15 ENCOUNTER — Telehealth: Payer: Self-pay | Admitting: *Deleted

## 2011-08-24 ENCOUNTER — Other Ambulatory Visit (HOSPITAL_BASED_OUTPATIENT_CLINIC_OR_DEPARTMENT_OTHER): Payer: BC Managed Care – PPO | Admitting: Lab

## 2011-08-24 ENCOUNTER — Encounter: Payer: Self-pay | Admitting: Physician Assistant

## 2011-08-24 ENCOUNTER — Ambulatory Visit (HOSPITAL_BASED_OUTPATIENT_CLINIC_OR_DEPARTMENT_OTHER): Payer: BC Managed Care – PPO | Admitting: Physician Assistant

## 2011-08-24 ENCOUNTER — Ambulatory Visit (HOSPITAL_BASED_OUTPATIENT_CLINIC_OR_DEPARTMENT_OTHER): Payer: BC Managed Care – PPO

## 2011-08-24 VITALS — BP 121/79 | HR 90 | Temp 97.8°F | Ht 65.0 in | Wt 158.0 lb

## 2011-08-24 DIAGNOSIS — Z170 Estrogen receptor positive status [ER+]: Secondary | ICD-10-CM

## 2011-08-24 DIAGNOSIS — C50419 Malignant neoplasm of upper-outer quadrant of unspecified female breast: Secondary | ICD-10-CM

## 2011-08-24 DIAGNOSIS — Z5111 Encounter for antineoplastic chemotherapy: Secondary | ICD-10-CM

## 2011-08-24 DIAGNOSIS — R197 Diarrhea, unspecified: Secondary | ICD-10-CM

## 2011-08-24 LAB — CBC WITH DIFFERENTIAL/PLATELET
BASO%: 0.2 % (ref 0.0–2.0)
EOS%: 0 % (ref 0.0–7.0)
Eosinophils Absolute: 0 10*3/uL (ref 0.0–0.5)
LYMPH%: 5.9 % — ABNORMAL LOW (ref 14.0–49.7)
MCH: 25.1 pg (ref 25.1–34.0)
MCHC: 31.4 g/dL — ABNORMAL LOW (ref 31.5–36.0)
MCV: 80 fL (ref 79.5–101.0)
MONO%: 12.7 % (ref 0.0–14.0)
NEUT#: 6.8 10*3/uL — ABNORMAL HIGH (ref 1.5–6.5)
Platelets: 397 10*3/uL (ref 145–400)
RBC: 3.9 10*6/uL (ref 3.70–5.45)
RDW: 21.7 % — ABNORMAL HIGH (ref 11.2–14.5)
nRBC: 0 % (ref 0–0)

## 2011-08-24 LAB — BASIC METABOLIC PANEL
CO2: 23 mEq/L (ref 19–32)
Calcium: 9.4 mg/dL (ref 8.4–10.5)
Creatinine, Ser: 0.5 mg/dL (ref 0.50–1.10)
Sodium: 140 mEq/L (ref 135–145)

## 2011-08-24 MED ORDER — ONDANSETRON 16 MG/50ML IVPB (CHCC)
16.0000 mg | Freq: Once | INTRAVENOUS | Status: AC
  Administered 2011-08-24: 16 mg via INTRAVENOUS

## 2011-08-24 MED ORDER — SODIUM CHLORIDE 0.9 % IV SOLN
Freq: Once | INTRAVENOUS | Status: AC
  Administered 2011-08-24: 11:00:00 via INTRAVENOUS

## 2011-08-24 MED ORDER — DEXAMETHASONE SODIUM PHOSPHATE 4 MG/ML IJ SOLN
20.0000 mg | Freq: Once | INTRAMUSCULAR | Status: AC
  Administered 2011-08-24: 20 mg via INTRAVENOUS

## 2011-08-24 MED ORDER — HEPARIN SOD (PORK) LOCK FLUSH 100 UNIT/ML IV SOLN
500.0000 [IU] | Freq: Once | INTRAVENOUS | Status: DC | PRN
  Filled 2011-08-24: qty 5

## 2011-08-24 MED ORDER — SODIUM CHLORIDE 0.9 % IV SOLN
600.0000 mg/m2 | Freq: Once | INTRAVENOUS | Status: AC
  Administered 2011-08-24: 1080 mg via INTRAVENOUS
  Filled 2011-08-24: qty 54

## 2011-08-24 MED ORDER — DOCETAXEL CHEMO INJECTION 160 MG/16ML
75.0000 mg/m2 | Freq: Once | INTRAVENOUS | Status: AC
  Administered 2011-08-24: 130 mg via INTRAVENOUS
  Filled 2011-08-24: qty 13

## 2011-08-24 MED ORDER — SODIUM CHLORIDE 0.9 % IJ SOLN
10.0000 mL | INTRAMUSCULAR | Status: DC | PRN
  Filled 2011-08-24: qty 10

## 2011-08-24 NOTE — Progress Notes (Signed)
Hematology and Oncology Follow Up Visit  Erin Castillo [id number] 1966-02-23 46 y.o. 08/24/2011    HPI: Erin Castillo is a [place] woman the age of 30 was referred by [name] for evaluation of left breast carcinoma.  The patient noticed a dimple in the lateral left breast and was able to palpate a mass. She waited a couple of months since she is still premenopausal, but the mass did not resolve. She brought it to the attention of [name] and underwent bilateral diagnostic mammography and left ultrasonography at [facility] on 04/20/2011. Compression views as well standard CC and  MLO views showed a spiculated mass in the upper outer quadrant of the left breast measuring 2.1 cm. By ultrasound, this was a lobulated, irregular, and hypoechoic mass measuring 1.8 cm. No abnormal appearing lymph nodes in the axilla.  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 The tumor was ER positive at 98%, PR positive at 100%, HER-2/neu negative with a CISH ratio of 1.42, and an MIB-1 of 9%.  The patient underwent bilateral breast MRI on 10-12, confirming a 2.3 cm irregularly marginated, enhancing mass, deep in the upper outer quadrant of the left breast. This was close to the underlying pectoralis major, but without definite extension into the muscle. No other suspicious areas in either breast, and no abnormal appearing lymph nodes. An incidental 1.1 cm anterior liver cyst was noted.  The patient is status post left lumpectomy and sentinel lymph node sampling on 05/15/2011 under the care of [name]. 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 No evidence of lymphovascular invasion. One of one sentinel lymph nodes was involved however.  An Oncotype DX had already been requested, since initially the patient appeared to be node negative.  This showed the patient to be slightly on the low risk side.  Patient is known to be BRCA 1 and 2 negative.  The patient received one dose of docetaxel, doxorubicin, and cyclophosphamide on 06/22/2011 with poor tolerance. Upon review of the Oncotype results, especially when taking into consideration the patient's poor tolerance of the first cycle of chemotherapy, the decision was made to discontinue the doxorubicin, and proceed with 3 additional cycles of docetaxel and cyclophosphamide given on a Q. three-week basis, for a total of 4 cycles of adjuvant chemotherapy.  Interim History:   Patient returns today accompanied by her sister and a friend for followup of her left breast carcinoma, currently due for her final q. 3 week dose of docetaxel/cyclophosphamide. This will be a total of 4 adjuvant cycles of chemotherapy, the original regimen of docetaxel/doxorubicin/cyclophosphamide discontinued after one dose due to poor tolerance. This is the third cycle of docetaxel/cyclophosphamide. The patient receives Neulasta on day 2 for granulocyte support. She also receives supportive IV fluids on day 2.  Erin Castillo  is relieved to be finishing her chemotherapy today. She's feeling well the exception of some continued fatigue, and some aches and pains in her lower extremities. She's had some slight "puffiness" in her lower extremities, but no pitting edema. She has had some slight weakness in the lower extremity, especially on the right side. On days 89 and 10 she noticed some increased numbness and tingling, somewhat in her fingertips, and also in her right foot and right lower leg. She actually had some pain in the right lower leg. This resolved after approximately 3 days, now with only  some mild residual tingling in the fingertips. This is not affecting any of her day-to-day activities. She's had no falling. No problems with fine motor skills such as buttoning a blouse or hooking a necklace.  Erin Castillo had a headache  this past week for couple of days, but that has also resolved. She's had no additional pain. No dizziness, change in vision, or nausea. No current weakness in the extremities. No mouth ulcers or oral sensitivity. Her bowels are moving regularly, neither diarrhea nor constipation. We did again review her bowel regimen to include Colace and or MiraLAX following treatment today as she does tend to develop constipation with the chemotherapy.   A detailed review of systems is otherwise noncontributory as noted below.  Review of Systems: Constitutional:  Fatigue, no weight loss, fever, night sweats Eyes: negative [id number] Cardiovascular: no chest pain or dyspnea on exertion Respiratory: no cough, shortness of breath, or wheezing Neurological: slight numbness and tingling in fingers Dermatological: negative Gastrointestinal: No abdominal pain, nausea, or change in bowel habits, no dark or tarry stools or abnormal bleeding.  Genito-Urinary: no dysuria, trouble voiding, or hematuria Hematological and Lymphatic: negative Breast: negative Musculoskeletal: Muscle aches and pains in the lower extremities Remaining ROS negative.    Medications: I have reviewed the patient's current medications. Current Outpatient Prescriptions  Medication Sig Dispense Refill  . acetaminophen (TYLENOL) 325 MG tablet Take 650 mg by mouth every 6 (six) hours as needed.        . citalopram (CELEXA) 20 MG tablet Take 20 mg by mouth daily.        Marland Kitchen dexamethasone (DECADRON) 4 MG tablet 2 tabs po BID x 5 days, beginning the day before each chemo dose  40 tablet  2  . docusate sodium (COLACE) 100 MG capsule Take 100 mg by mouth daily as needed.      . lidocaine-prilocaine (EMLA) cream Apply 1 application topically daily as needed. For chemo       . LORazepam (ATIVAN) 0.5 MG tablet 1 tab po BID prn anxiety and/or nausea  30 tablet  0  . naproxen sodium (ANAPROX) 220 MG tablet Take 220 mg by mouth 2 (two) times daily with a  meal.        . omeprazole (PRILOSEC) 20 MG capsule Take 20 mg by mouth daily as needed. For heartburn      . ondansetron (ZOFRAN) 8 MG tablet Take 8 mg by mouth every 12 (twelve) hours as needed.        . polyethylene glycol (MIRALAX / GLYCOLAX) packet Take 17 g by mouth daily as needed.      . prochlorperazine (COMPAZINE) 10 MG tablet 1 tab po ACHS x 3 days after each chemo, then 1 tab PO q 6 hr prn nausea  30 tablet  3  . b complex vitamins tablet Take 1 tablet by mouth daily.        Marland Kitchen loperamide (IMODIUM) 2 MG capsule Take 2 mg by mouth 4 (four) times daily as needed. For diarrhea        PAST MEDICAL HISTORY:  Significant for remote sinus surgery, C-section x1, migraines occasionally occurring and associated with her period, mild anxiety/depression.  FAMILY HISTORY:  The patient's father is alive at age 21.  The patient's mother is alive at age 39.  The patient's father did have bladder cancer but has done well with that.  She has 3 sisters, no brothers.  There is no history of breast or ovarian cancer in the  family.  GYNECOLOGIC HISTORY:  She had menarche at age 66.  Most recent period was September 15th.  Her periods are regular.  They last about 4 days of which 2 days are heavy.  The patient is GX, P3.  She understands that the fact that she did not deliver a child before the age of 77 does double her risk of breast cancer developing.  SOCIAL HISTORY:  She works as an Print production planner for [employer].  Her friend, [name], who actually heads the board for the [employer], often accompanies Zunaira to her appointments. The patient is separated from her husband, [name], who works at [employer].  They are not officially divorced, however.  She understands that currently unless she fills out a healthcare power of attorney, [name] would be her healthcare power of attorney if something happened and she was not able to tell us her medical wishes.  Children are Molli Hazard, age 44, Rayfield Citizen, age  76, and [name], age 65.  The patient attends [employer].  Allergies: No Known Allergies  Physical Exam: Filed Vitals:   08/24/11 0958  BP: 121/79  Pulse: 90  Temp: 97.8 F (36.6 C)   Body mass index is 26.29 kg/(m^2). ECOG: 0  HEENT:  Sclerae anicteric, conjunctivae pink.  Oropharynx clear.  No mucositis or candidiasis.   Nodes:  No cervical, supraclavicular, or axillary lymphadenopathy palpated.  Breast Exam:  Deferred. Lungs:  Clear to auscultation bilaterally.  No crackles, rhonchi, or wheezes.   Heart:  Regular rate and rhythm.   Abdomen:  Soft, nontender.  Positive bowel sounds.  No organomegaly or masses palpated.   Musculoskeletal:  No focal spinal tenderness to palpation.  Extremities:  Benign.  No peripheral edema or cyanosis.   Skin:  Benign. Neuro:  Nonfocal.   Lab Results: Lab Results  Component Value Date   WBC 8.4 08/24/2011   HGB 9.8* 08/24/2011   HCT 31.2* 08/24/2011   MCV 80.0 08/24/2011   PLT 397 08/24/2011   NEUTROABS 6.8* 08/24/2011     Chemistry      Component Value Date/Time   NA 138 08/03/2011 1134   K 4.3 08/03/2011 1134   CL 103 08/03/2011 1134   CO2 27 08/03/2011 1134   BUN 13 08/03/2011 1134   CREATININE 0.64 08/03/2011 1134      Component Value Date/Time   CALCIUM 9.4 08/03/2011 1134   ALKPHOS 58 08/03/2011 1134   AST 19 08/03/2011 1134   ALT 46* 08/03/2011 1134   BILITOT 0.3 08/03/2011 1134      A ferritin level was normal at 202 on 07/21/11.  Radiological Studies:  Dg Chest Portable 1 View  06/12/2011  *RADIOLOGY REPORT*  Clinical Data: Port-A-Cath placement.  PORTABLE CHEST - 1 VIEW  Comparison: None.  Findings: 1117 hours.  Right subclavian power port tip is near the SVC right atrial junction.  There is no pneumothorax.  The lungs are clear.  Heart size and mediastinal contours are normal.  IMPRESSION: Right subclavian power port placement without demonstrated complication.  Original Report Authenticated By: Gerrianne Scale, M.D.   Dg Fluoro Guide Cv Line-no Report  06/12/2011  CLINICAL DATA: Portacath   FLOURO GUIDE CV LINE  Fluoroscopy was utilized by the requesting physician.  No radiographic  interpretation.       Assessment:  46 year old BRCA 1-2 negative [place] woman   (1)  Status post left lumpectomy and sentinel lymph node biopsy October of 2012 for a 2.4 cm invasive  ductal carcinoma, grade 2, involving the single sentinel lymph node, and so T2 N1 or stage IIb. The tumor was grade 2, strongly estrogen and progesterone receptor positive, with an MIB-1 of 9% and no HER-2/neu amplification.   (2) Treated initially with docetaxel, doxorubicin, and cyclophosphamide for one cycle, with very poor tolerance  (3) her treatment plan has been changed to cyclophosphamide/docetaxel, for an additional three cycles   Plan:  Nikiesha will proceed to treatment today as scheduled for day 1 cycle 3 of 3 planned q. three-week doses of docetaxel/cyclophosphamide.  She will return tomorrow on day 2 for supportive IV fluids in addition to her Neulasta injection. Again, as noted above, we reviewed her bowel regimen to prevent constipation with this cycle.  Paiten will return next week on February 7 to see Dr. Dayton Scrape and Dr. Darnelle Catalan. They will discuss radiation therapy, and will initiate her long-term followup through our office. Of course following radiation, she will be on an anti-estrogen regimen as well.   This case was reviewed with the patient who voices understanding and agreement with our plan. She will call any changes or problems prior to her next appointment.  Shay Bartoli, PA-C 08/24/2011

## 2011-08-25 ENCOUNTER — Ambulatory Visit (HOSPITAL_BASED_OUTPATIENT_CLINIC_OR_DEPARTMENT_OTHER): Payer: BC Managed Care – PPO

## 2011-08-25 ENCOUNTER — Ambulatory Visit: Payer: BC Managed Care – PPO

## 2011-08-25 VITALS — BP 107/77 | HR 66 | Temp 96.2°F

## 2011-08-25 DIAGNOSIS — C50419 Malignant neoplasm of upper-outer quadrant of unspecified female breast: Secondary | ICD-10-CM

## 2011-08-25 MED ORDER — PEGFILGRASTIM INJECTION 6 MG/0.6ML
6.0000 mg | Freq: Once | SUBCUTANEOUS | Status: AC
  Administered 2011-08-25: 6 mg via SUBCUTANEOUS
  Filled 2011-08-25: qty 0.6

## 2011-08-25 MED ORDER — SODIUM CHLORIDE 0.9 % IV SOLN
Freq: Once | INTRAVENOUS | Status: AC
  Administered 2011-08-25: 10:00:00 via INTRAVENOUS

## 2011-08-29 ENCOUNTER — Encounter: Payer: Self-pay | Admitting: Radiation Oncology

## 2011-08-29 ENCOUNTER — Ambulatory Visit
Admission: RE | Admit: 2011-08-29 | Discharge: 2011-08-29 | Disposition: A | Discharge status: Home / Self Care | Payer: BC Managed Care – PPO | Source: Ambulatory Visit | Attending: Radiation Oncology | Admitting: Radiation Oncology

## 2011-08-29 DIAGNOSIS — C50419 Malignant neoplasm of upper-outer quadrant of unspecified female breast: Secondary | ICD-10-CM

## 2011-08-29 NOTE — Progress Notes (Signed)
46 year old female. Separated from her husband. Children Myra Rude, Caroline 11 and [name] 11.   Noticed a dimple in the lateral left breast and was able to palpate a mass. She brought it to the attention of Dr. Jari Sportsman and underwent bilateral mammography and ultrasound on 04/20/11. Spiculated mass in the upper outer quadrant of the left breast measuring 2.1 cm was revealed. Biopsy was performed on 04/20/11 showing invasive ductal carcinoma which described as grade 2, ER, PR positive, and HER 2 negative. Bilateral breast MRI on 10/12 confirmed a 2.3 cm irregularly marginated enhancing mass deep in the upper outer quadrant of the left breast. The patient is status post left lumpectomy and sentinel lymph node sampling on 05/15/2011 and pathology revealed 2.4 invasive ductal carcinoma grade 2 with negative margins. One of one sentinel lymph nodes involved. BRCA 1 and 2 negative. Patient received one dose of docetaxel, doxorubicin and cyclophosphamide on 06/22/11 with poor tolerance. Decision was made to discontinue the doxorubicin and proceed with three addition cycles of docetaxel and cyclophosphamide given on every three week basis for a total of 4 cycles of adjuvant chemotherapy.

## 2011-08-29 NOTE — Progress Notes (Signed)
Please see the Nurse Progress Note in the MD Initial Consult Encounter for this patient. 

## 2011-08-29 NOTE — Progress Notes (Signed)
Complete NUTRITION RISK SCREEN worksheet without concerns submitted to Zenovia Jarred, RD. Also, complete PATIENT MEASURE OF DISTRESS worksheet with a score of 5 submitted to social work.

## 2011-08-29 NOTE — Progress Notes (Signed)
Patient presents to the clinic today accompanied by his sister for an initial consult reference breast ca. Patient is alert and oriented to person, place, and time. No distress noted. Steady gait noted. Pleasant affect noted. Patient denies pain at this time. Patient reports that she completed the last of her four cycles of chemotherapy on 08/24/2011. Patient scheduled to follow up with Dr. Darnelle Catalan on Thursday but, no scans are scheduled. Patient denies nausea, vomiting, or diarrhea. Patient reports intermittent headaches. Patient reports that she is dealing with constipation. Reported all findings to Dr. Dayton Scrape.

## 2011-08-29 NOTE — Progress Notes (Signed)
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 I last saw the patient on October 30 following her left partial mastectomy and sentinel lymph node biopsy. To review, she is found to have a 2.4 cm invasive ductal carcinoma with a close margin being 0.2 cm, posteriorly. A single sentinel lymph node contained metastatic disease with extracapsular extension. Based on the Z11 study, and the fact that she would receive radiation therapy, she was not felt to require a completion left axillary node dissection. She will to receive adjuvant chemotherapy with Dr. Darnelle Catalan. She received 1 dose of docetaxel him a doxorubicin and cyclophosphamide on 06/22/2011 with poor tolerance. She was then changed to 3 additional cycles of docetaxel and cyclophosphamide for a total of 4 cycles of adjuvant chemotherapy. Her chemotherapy was well tolerated although she did have moderate fatigue. She has slight tingling of her fingertips but otherwise has no significant neuropathy. She is here today with her sister from [place].  Physical examination: Alert and oriented. Vital signs BP 85/59, pulse 102, RR 16, temp 97.5 Head and neck examination: She has alopecia. Nodes: Without palpable cervical, supraclavicular, or axillary lymphadenopathy. Chest: Right anterior Port-A-Cath, lungs clear. Heart: Regular rate and rhythm. Abdomen: Without masses or organomegaly. Extremities: Without edema. Neurologic examination: Grossly nonfocal.  Laboratory data from August 24, 2011: White blood count 8.4 K. hemoglobin 9.8, platelet count 390 7K.  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 We again discussed the potential benefits and toxicities of radiation therapy. We discussed "deep inspiration and breath-hold" tangential therapy to her left breast and axillary region with "high  tangents". Consent was signed today.  30 minutes was spent face-to-face with the patient, primarily counseling the patient and coordinating her care.

## 2011-08-31 ENCOUNTER — Ambulatory Visit (HOSPITAL_BASED_OUTPATIENT_CLINIC_OR_DEPARTMENT_OTHER): Payer: BC Managed Care – PPO | Admitting: Oncology

## 2011-08-31 ENCOUNTER — Ambulatory Visit: Payer: BC Managed Care – PPO | Admitting: Radiation Oncology

## 2011-08-31 ENCOUNTER — Other Ambulatory Visit (HOSPITAL_BASED_OUTPATIENT_CLINIC_OR_DEPARTMENT_OTHER): Payer: BC Managed Care – PPO

## 2011-08-31 ENCOUNTER — Ambulatory Visit: Payer: BC Managed Care – PPO

## 2011-08-31 VITALS — BP 90/61 | HR 102 | Temp 98.2°F | Ht 65.0 in | Wt 160.1 lb

## 2011-08-31 DIAGNOSIS — C50419 Malignant neoplasm of upper-outer quadrant of unspecified female breast: Secondary | ICD-10-CM

## 2011-08-31 DIAGNOSIS — Z170 Estrogen receptor positive status [ER+]: Secondary | ICD-10-CM

## 2011-08-31 DIAGNOSIS — C50919 Malignant neoplasm of unspecified site of unspecified female breast: Secondary | ICD-10-CM

## 2011-08-31 DIAGNOSIS — Z79899 Other long term (current) drug therapy: Secondary | ICD-10-CM

## 2011-08-31 LAB — COMPREHENSIVE METABOLIC PANEL
Alkaline Phosphatase: 76 U/L (ref 39–117)
BUN: 13 mg/dL (ref 6–23)
Creatinine, Ser: 0.58 mg/dL (ref 0.50–1.10)
Glucose, Bld: 90 mg/dL (ref 70–99)
Total Bilirubin: 0.2 mg/dL — ABNORMAL LOW (ref 0.3–1.2)

## 2011-08-31 LAB — CBC WITH DIFFERENTIAL/PLATELET
BASO%: 0.2 % (ref 0.0–2.0)
Basophils Absolute: 0 10*3/uL (ref 0.0–0.1)
EOS%: 1.2 % (ref 0.0–7.0)
HCT: 31.7 % — ABNORMAL LOW (ref 34.8–46.6)
HGB: 10.5 g/dL — ABNORMAL LOW (ref 11.6–15.9)
MCH: 26.8 pg (ref 25.1–34.0)
MCHC: 33 g/dL (ref 31.5–36.0)
MCV: 81.3 fL (ref 79.5–101.0)
MONO%: 2.7 % (ref 0.0–14.0)
NEUT%: 85.7 % — ABNORMAL HIGH (ref 38.4–76.8)

## 2011-08-31 NOTE — Progress Notes (Signed)
Hematology and Oncology Follow Up Visit  Erin Castillo [id number] 24-Nov-1965 46 y.o. 08/31/2011    HPI: Erin Castillo is a [place] woman the age of 30 was referred by [name] for evaluation of left breast carcinoma.  The patient noticed a dimple in the lateral left breast and was able to palpate a mass. She waited a couple of months since she is still premenopausal, but the mass did not resolve. She brought it to the attention of [name] and underwent bilateral diagnostic mammography and left ultrasonography at [facility] on 04/20/2011. Compression views as well standard CC and  MLO views showed a spiculated mass in the upper outer quadrant of the left breast measuring 2.1 cm. By ultrasound, this was a lobulated, irregular, and hypoechoic mass measuring 1.8 cm. No abnormal appearing lymph nodes in the axilla.  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 The tumor was ER positive at 98%, PR positive at 100%, HER-2/neu negative with a CISH ratio of 1.42, and an MIB-1 of 9%.  The patient underwent bilateral breast MRI on 10-12, confirming a 2.3 cm irregularly marginated, enhancing mass, deep in the upper outer quadrant of the left breast. This was close to the underlying pectoralis major, but without definite extension into the muscle. No other suspicious areas in either breast, and no abnormal appearing lymph nodes. An incidental 1.1 cm anterior liver cyst was noted.  The patient is status post left lumpectomy and sentinel lymph node sampling on 05/15/2011 under the care of [name]. 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 No evidence of lymphovascular invasion. One of one sentinel lymph nodes was involved however.  An Oncotype DX had already been requested, since initially the patient appeared to be node negative. This  showed the patient to be slightly on the low risk side.  Patient is known to be BRCA 1 and 2 negative.  The patient received one dose of docetaxel, doxorubicin, and cyclophosphamide on 06/22/2011 with poor tolerance. Upon review of the Oncotype results, especially when taking into consideration the patient's poor tolerance of the first cycle of chemotherapy, the decision was made to discontinue the doxorubicin, and proceed with 3 additional cycles of docetaxel and cyclophosphamide given on a Q. three-week basis, for a total of 4 cycles of adjuvant chemotherapy.  Interim History:   Erin Castillo returns today with her sister for followup of Erin Castillo's breast cancer. She received her final dose of chemotherapy a week ago. Going back over the treatments, the worst part of it was feeling tired for several days, so that when she got treated on a Thursday she was not able to get back to work until the following Wednesday. She was able to continue to work full-time however right through her treatments.  Review of Systems: She had nausea but no vomiting. She developed very minimal peripheral neuropathy about 2 weeks ago. This is persisting, but very minor, and it does not affect her activities of daily living. She still has slightly blurred vision. She has mild insomnia. Otherwise a detailed review of systems was noncontributory. Note that she has not had a menstrual period since November of 2012.    Medications: I have reviewed the patient's current medications. Current Outpatient Prescriptions  Medication Sig Dispense Refill  . acetaminophen (TYLENOL) 325 MG tablet Take 650 mg by mouth every 6 (six) hours as needed.        Marland Kitchen  b complex vitamins tablet Take 1 tablet by mouth daily.        . citalopram (CELEXA) 20 MG tablet Take 20 mg by mouth daily.        . diphenhydramine-acetaminophen (TYLENOL PM) 25-500 MG TABS Take 1 tablet by mouth at bedtime as needed.      . docusate sodium (COLACE) 100 MG capsule Take  100 mg by mouth daily as needed.      . lidocaine-prilocaine (EMLA) cream Apply 1 application topically daily as needed. For chemo       . LORazepam (ATIVAN) 0.5 MG tablet 1 tab po BID prn anxiety and/or nausea  30 tablet  0  . omeprazole (PRILOSEC) 20 MG capsule Take 20 mg by mouth daily as needed. For heartburn      . dexamethasone (DECADRON) 4 MG tablet 2 tabs po BID x 5 days, beginning the day before each chemo dose  40 tablet  2  . loperamide (IMODIUM) 2 MG capsule Take 2 mg by mouth 4 (four) times daily as needed. For diarrhea       . naproxen sodium (ANAPROX) 220 MG tablet Take 220 mg by mouth 2 (two) times daily with a meal.        . ondansetron (ZOFRAN) 8 MG tablet Take 8 mg by mouth every 12 (twelve) hours as needed.        . polyethylene glycol (MIRALAX / GLYCOLAX) packet Take 17 g by mouth daily as needed.      . prochlorperazine (COMPAZINE) 10 MG tablet 1 tab po ACHS x 3 days after each chemo, then 1 tab PO q 6 hr prn nausea  30 tablet  3   PAST MEDICAL HISTORY:  Significant for remote sinus surgery, C-section x1, migraines occasionally occurring and associated with her period, mild anxiety/depression.  FAMILY HISTORY:  The patient's father is alive at age 29.  The patient's mother is alive at age 50.  The patient's father did have bladder cancer but has done well with that.  She has 3 sisters, no brothers.  There is no history of breast or ovarian cancer in the family.  GYNECOLOGIC HISTORY:  She had menarche at age 52.  The patient is GX, P3.  She understands that the fact that she did not deliver a child before the age of 34 does double her risk of breast cancer developing. She stopped having periods after her first chemotherapy, November of 2012.  SOCIAL HISTORY:  She works as an Print production planner for [employer].  Her friend, [name], who actually heads the board for the [employer], often accompanies Erin Castillo to her appointments. The patient is separated from her  husband, Erin Castillo, who works at [employer]. They are not officially divorced, however.  She understands that currently unless she fills out a healthcare power of attorney, Erin Castillo would be her healthcare power of attorney if something happened and she was not able to tell us her medical wishes.  Children are Molli Hazard, age 62, Rayfield Citizen, age 57, and [name], age 18.  The patient attends [employer].  Allergies: No Known Allergies  Physical Exam: Early middle-aged white woman who appears comfortable Filed Vitals:   08/31/11 1033  BP: 90/61  Pulse: 102  Temp: 98.2 F (36.8 C)   Body mass index is 26.64 kg/(m^2). ECOG: 0  HEENT:  Sclerae anicteric, conjunctivae pink.  Oropharynx clear.  No mucositis or candidiasis.   Nodes:  No cervical, supraclavicular, or axillary lymphadenopathy palpated.  Breast  Exam:  Right breast, no suspicious findings. Left breast, status post lumpectomy. The nipple is slightly flatter than the right, doubtless secondary to her surgery. There is no significant induration below the scar.  Lungs:  Clear to auscultation bilaterally.  No crackles, rhonchi, or wheezes.   Heart:  Regular rate and rhythm.   Abdomen:  Soft, nontender.  Positive bowel sounds.  No organomegaly or masses palpated.   Musculoskeletal:  No focal spinal tenderness to palpation.  Extremities:  Benign.  No peripheral edema or cyanosis.   Skin:  Benign. Neuro:  Nonfocal.   Lab Results: Lab Results  Component Value Date   WBC 8.1 08/31/2011   HGB 10.5* 08/31/2011   HCT 31.7* 08/31/2011   MCV 81.3 08/31/2011   PLT 304 08/31/2011   NEUTROABS 7.0* 08/31/2011     Chemistry      Component Value Date/Time   NA 140 08/24/2011 0948   K 4.5 08/24/2011 0948   CL 106 08/24/2011 0948   CO2 23 08/24/2011 0948   BUN 17 08/24/2011 0948   CREATININE 0.50 08/24/2011 0948      Component Value Date/Time   CALCIUM 9.4 08/24/2011 0948   ALKPHOS 58 08/03/2011 1134   AST 19 08/03/2011 1134   ALT 46* 08/03/2011 1134    BILITOT 0.3 08/03/2011 1134      A ferritin level was normal at 202 on 07/21/11.  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 The tumor was grade 2, strongly estrogen and progesterone receptor positive, with an MIB-1 of 9% and no HER-2/neu amplification.   (2) Treated initially with docetaxel, doxorubicin, and cyclophosphamide for one cycle, with very poor tolerance  (3) status post cyclophosphamide/docetaxel, for an additional three cycles completed January 2013   Plan:  She did remarkably well with her chemotherapy, and is now ready to start her radiation treatments. My best guess is that she will be done towards the end of March. Today I gave her written information on tamoxifen, discussed the possible toxicities, side effects, and complications, and wrote her a prescription, to start as soon as she completes her radiation treatments. I have made her a return appointment to see me in May to make sure she is tolerating it well. She is a ready having hot flashes but we are going to wait and see whether or not they get better before we intervene in that regard. She knows to call for any problems that may develop before the next visit  MAGRINAT,GUSTAV C, PA-C 08/31/2011

## 2011-09-04 NOTE — Telephone Encounter (Signed)
Entered in error

## 2011-09-06 ENCOUNTER — Ambulatory Visit
Admission: RE | Admit: 2011-09-06 | Discharge: 2011-09-06 | Disposition: A | Discharge status: Home / Self Care | Payer: BC Managed Care – PPO | Source: Ambulatory Visit | Attending: Radiation Oncology | Admitting: Radiation Oncology

## 2011-09-06 DIAGNOSIS — C50419 Malignant neoplasm of upper-outer quadrant of unspecified female breast: Secondary | ICD-10-CM

## 2011-09-07 NOTE — Progress Notes (Signed)
Simulation treatment planning note:  The patient was taken to the CT simulator and a custom neck mold on a breast board was constructed for immobilization. Her left partial mastectomy scar was marked with a radiopaque wire. Her field boarders were also marked. She was scanned with free breathing. It was anticipated that the cardiac silhouette would be within the tangential fields. She was then scanned with deep inspiration and breath-hold in her cardiac silhouette clearly moved away from the tangential field. She is setup to medial and lateral left breast tangents with breath-hold. 2 separate multileaf collimators were designed to conform the field. I prescribing 5040 cGy in 28 sessions utilizing 6 MV photons. This be followed by electron beam boost of 1200 cGy in 6 sessions utilizing 15 MEV electrons. An isodose plan and dosimetry are requested. I most requesting dose volume histograms and 3-D planning.

## 2011-09-08 ENCOUNTER — Other Ambulatory Visit: Payer: Self-pay | Admitting: *Deleted

## 2011-09-08 MED ORDER — TAMOXIFEN CITRATE 20 MG PO TABS: 20.0000 mg | ORAL_TABLET | Freq: Every day | ORAL | Status: AC

## 2011-09-13 ENCOUNTER — Ambulatory Visit (INDEPENDENT_AMBULATORY_CARE_PROVIDER_SITE_OTHER): Payer: BC Managed Care – PPO | Admitting: General Surgery

## 2011-09-13 ENCOUNTER — Ambulatory Visit: Payer: BC Managed Care – PPO

## 2011-09-13 ENCOUNTER — Encounter (INDEPENDENT_AMBULATORY_CARE_PROVIDER_SITE_OTHER): Payer: Self-pay | Admitting: General Surgery

## 2011-09-13 VITALS — BP 106/72 | HR 92 | Temp 98.0°F | Resp 16 | Ht 65.0 in | Wt 160.8 lb

## 2011-09-13 DIAGNOSIS — C50419 Malignant neoplasm of upper-outer quadrant of unspecified female breast: Secondary | ICD-10-CM

## 2011-09-13 NOTE — Progress Notes (Signed)
History: Patient returns for more long-term followup status post left breast lumpectomy and sentinel lymph node biopsy for T2 N1 invasive cancer of the left breast. She has now completed her chemotherapy. She has tolerated this pretty well. She is being set up currently for radiation. No complaints related to her breast surgery.  Exam: Gen.: Alopecia but appears well Lymph nodes: No cervical, subclavicular or axillary nodes palpable Breasts: Well-healed incision upper outer quadrant left breast without thickening or mass or other abnormality  Assessment and plan: Doing well following surgery and chemotherapy for stage IIB cancer left breast and now beginning radiation. I'll see her back in 4 months.

## 2011-09-14 ENCOUNTER — Other Ambulatory Visit: Payer: Self-pay | Admitting: Physician Assistant

## 2011-09-14 ENCOUNTER — Ambulatory Visit
Admission: RE | Admit: 2011-09-14 | Discharge: 2011-09-14 | Disposition: A | Discharge status: Home / Self Care | Payer: BC Managed Care – PPO | Source: Ambulatory Visit | Attending: Radiation Oncology | Admitting: Radiation Oncology

## 2011-09-14 ENCOUNTER — Encounter: Payer: Self-pay | Admitting: Radiation Oncology

## 2011-09-14 NOTE — Progress Notes (Signed)
Simulation verification note: The patient underwent simulation verification for treatment to her left breast. Her isocenter is in good position and the multileaf collimators contoured the treatment volume appropriately. 

## 2011-09-15 ENCOUNTER — Ambulatory Visit: Payer: BC Managed Care – PPO

## 2011-09-18 ENCOUNTER — Ambulatory Visit
Admission: RE | Admit: 2011-09-18 | Discharge: 2011-09-18 | Disposition: A | Discharge status: Home / Self Care | Payer: BC Managed Care – PPO | Source: Ambulatory Visit | Attending: Radiation Oncology | Admitting: Radiation Oncology

## 2011-09-18 ENCOUNTER — Encounter: Payer: Self-pay | Admitting: *Deleted

## 2011-09-18 ENCOUNTER — Encounter: Payer: Self-pay | Admitting: Radiation Oncology

## 2011-09-18 VITALS — BP 109/70 | HR 86 | Resp 18 | Wt 162.7 lb

## 2011-09-18 DIAGNOSIS — C50419 Malignant neoplasm of upper-outer quadrant of unspecified female breast: Secondary | ICD-10-CM

## 2011-09-18 MED ORDER — RADIAPLEXRX EX GEL
Freq: Once | CUTANEOUS | Status: AC
  Administered 2011-09-18: 11:00:00 via TOPICAL

## 2011-09-18 MED ORDER — ALRA NON-METALLIC DEODORANT (RAD-ONC)
1.0000 "application " | Freq: Once | TOPICAL | Status: AC
  Administered 2011-09-18: 1 via TOPICAL

## 2011-09-18 NOTE — Progress Notes (Signed)
Faxed office notes to [place] at [facility] for [facility].

## 2011-09-18 NOTE — Progress Notes (Signed)
Received patient in the clinic today for post sim education with Lelon Mast, RN and under treat visit with Dr. Dayton Scrape. Patient is alert and oriented to person, place, and time. No distress noted. Steady gait noted. Pleasant affect noted. Patient denies pain at this time. Oriented patient to staff and routine of the clinic. Provided patient with RADIATION THERAPY AND YOU handbook then, reviewed the pertinent information. Reviewed potential side effect and how to manage those. Also, provided patient with Radiaplex and Alra then, reviewed use. Provided patient with this writers contact information and encouraged to call with needs. Patient verbalized understanding of all things reviewed. All questions answered.

## 2011-09-18 NOTE — Progress Notes (Signed)
Patient presents to the clinic today for an under treat visit with Dr. Dayton Scrape. Patient is alert and oriented to person, place, and time. No distress noted. Steady gait noted. Pleasant affect noted. Patient has no complaints. Reported all findings to Dr. Dayton Scrape.

## 2011-09-18 NOTE — Progress Notes (Signed)
Weekly Management Note:  Site:L Breast Current Dose:  180  cGy Projected Dose: 6240   cGy  Narrative: The patient is seen today for routine under treatment assessment. CBCT/MVCT images/port films were reviewed. The chart was reviewed.   No complaints today. Her setup is excellent.  Physical Examination:  Filed Vitals:   09/18/11 1101  BP: 109/70  Pulse: 86  Resp: 18  .  Weight: 162 lb 11.2 oz (73.8 kg). No skin changes.  Impression: Tolerating radiation therapy well.  Plan: Continue radiation therapy as planned.

## 2011-09-19 ENCOUNTER — Ambulatory Visit: Payer: BC Managed Care – PPO | Admitting: Psychiatry

## 2011-09-19 ENCOUNTER — Ambulatory Visit
Admission: RE | Admit: 2011-09-19 | Discharge: 2011-09-19 | Disposition: A | Discharge status: Home / Self Care | Payer: BC Managed Care – PPO | Source: Ambulatory Visit | Attending: Radiation Oncology | Admitting: Radiation Oncology

## 2011-09-20 ENCOUNTER — Ambulatory Visit
Admission: RE | Admit: 2011-09-20 | Discharge: 2011-09-20 | Disposition: A | Discharge status: Home / Self Care | Payer: BC Managed Care – PPO | Source: Ambulatory Visit | Attending: Radiation Oncology | Admitting: Radiation Oncology

## 2011-09-21 ENCOUNTER — Ambulatory Visit
Admission: RE | Admit: 2011-09-21 | Discharge: 2011-09-21 | Disposition: A | Discharge status: Home / Self Care | Payer: BC Managed Care – PPO | Source: Ambulatory Visit | Attending: Radiation Oncology | Admitting: Radiation Oncology

## 2011-09-22 ENCOUNTER — Ambulatory Visit
Admission: RE | Admit: 2011-09-22 | Discharge: 2011-09-22 | Disposition: A | Discharge status: Home / Self Care | Payer: BC Managed Care – PPO | Source: Ambulatory Visit | Attending: Radiation Oncology | Admitting: Radiation Oncology

## 2011-09-25 ENCOUNTER — Ambulatory Visit
Admission: RE | Admit: 2011-09-25 | Discharge: 2011-09-25 | Disposition: A | Discharge status: Home / Self Care | Payer: BC Managed Care – PPO | Source: Ambulatory Visit | Attending: Radiation Oncology | Admitting: Radiation Oncology

## 2011-09-25 ENCOUNTER — Encounter: Payer: Self-pay | Admitting: Radiation Oncology

## 2011-09-25 VITALS — BP 119/71 | HR 82 | Resp 18 | Wt 162.0 lb

## 2011-09-25 DIAGNOSIS — C50419 Malignant neoplasm of upper-outer quadrant of unspecified female breast: Secondary | ICD-10-CM

## 2011-09-25 NOTE — Progress Notes (Signed)
Weekly Management Note:  Site:L Breast Current Dose:  1080  cGy Projected Dose: 6240  cGy  Narrative: The patient is seen today for routine under treatment assessment. CBCT/MVCT images/port films were reviewed. The chart was reviewed.   No complaints today. She uses Radioplex gel when necessary.  Physical Examination:  Filed Vitals:   09/25/11 1041  BP: 119/71  Pulse: 82  Resp: 18  .  Weight: 162 lb (73.483 kg). No significant skin changes.  Impression: Tolerating radiation therapy well.  Plan: Continue radiation therapy as planned.

## 2011-09-25 NOTE — Progress Notes (Signed)
Patient presents to the clinic today unaccompanied for under treatment with Dr. Dayton Scrape. Patient is alert and oriented to person, place, and time. No distress noted. Steady gait noted. Pleasant affect noted. Patient denies pain at this time. Patient has no complaints at this time. Patient has no complaints. Patient reports using Alra and Radiaplex as directed. Reported all findings to Dr. Dayton Scrape.

## 2011-09-26 ENCOUNTER — Ambulatory Visit: Payer: BC Managed Care – PPO

## 2011-09-27 ENCOUNTER — Ambulatory Visit
Admission: RE | Admit: 2011-09-27 | Discharge: 2011-09-27 | Disposition: A | Discharge status: Home / Self Care | Payer: BC Managed Care – PPO | Source: Ambulatory Visit | Attending: Radiation Oncology | Admitting: Radiation Oncology

## 2011-09-28 ENCOUNTER — Ambulatory Visit
Admission: RE | Admit: 2011-09-28 | Discharge: 2011-09-28 | Disposition: A | Discharge status: Home / Self Care | Payer: BC Managed Care – PPO | Source: Ambulatory Visit | Attending: Radiation Oncology | Admitting: Radiation Oncology

## 2011-09-29 ENCOUNTER — Encounter: Payer: Self-pay | Admitting: *Deleted

## 2011-09-29 ENCOUNTER — Ambulatory Visit
Admission: RE | Admit: 2011-09-29 | Discharge: 2011-09-29 | Disposition: A | Discharge status: Home / Self Care | Payer: BC Managed Care – PPO | Source: Ambulatory Visit | Attending: Radiation Oncology | Admitting: Radiation Oncology

## 2011-09-29 NOTE — Progress Notes (Signed)
[facility] Psychosocial Distress Screening Clinical Social Work  Clinical Social Work was referred by distress screening protocol.  This was the patient's 2nd distress screening.  The patient scored a 5 on the Psychosocial Distress Thermometer which indicates moderate distress.  The pt was originally screened in October and scored a 4.  Clinical Social Worker was unable to meet with pt in the exam room; therefore contacted pt at home to assess for distress and other psychosocial needs.  Pt stated she was doing "ok" since recently starting radiation.  Pt stated she was experiencing some fatigue, but overall was doing well.  CSW had previously given pt information on kids path and communicating with your children after diagnosis.  Pt stated that the information was helpful and her children were doing well.  CSW reviewed [facility] support services with pt, and encouraged her to call if she had any needs or concerns.  Pt was appreciative of contact.            Clinical Social Worker follow up needed:not at this time  Tamala Julian, MSW, LCSW Clinical Social Worker [facility] [phone]

## 2011-10-02 ENCOUNTER — Encounter: Payer: Self-pay | Admitting: Radiation Oncology

## 2011-10-02 ENCOUNTER — Ambulatory Visit
Admission: RE | Admit: 2011-10-02 | Discharge: 2011-10-02 | Disposition: A | Discharge status: Home / Self Care | Payer: BC Managed Care – PPO | Source: Ambulatory Visit | Attending: Radiation Oncology | Admitting: Radiation Oncology

## 2011-10-02 VITALS — BP 108/72 | HR 76 | Resp 18 | Wt 162.0 lb

## 2011-10-02 DIAGNOSIS — C50419 Malignant neoplasm of upper-outer quadrant of unspecified female breast: Secondary | ICD-10-CM

## 2011-10-02 NOTE — Progress Notes (Signed)
Patient presents to the clinic today unaccompanied for under treat visit with Dr. Dayton Scrape. Patient is alert and oriented to person, place, and time. No distress noted. Steady gait noted. Pleasant affect noted. Patient denies any pain at this time. Patient reports tanning of the treated breast. Patient reports that she has notice a decrease in her energy level. Reported all findings to Dr. Dayton Scrape.

## 2011-10-02 NOTE — Progress Notes (Signed)
Weekly Management Note:  Site:L Breast Current Dose:  1800  cGy Projected Dose: 6240  cGy  Narrative: The patient is seen today for routine under treatment assessment. CBCT/MVCT images/port films were reviewed. The chart was reviewed.   No complaints today. She has not yet started using Radioplex gel.  Physical Examination:  Filed Vitals:   10/02/11 1045  BP: 108/72  Pulse: 76  Resp: 18  .  Weight: 162 lb (73.483 kg). There is mild hyperpigmentation of the skin along the left inframammary region and axilla. No significant erythema or any areas of desquamation.  Impression: Tolerating radiation therapy well.  Plan: Continue radiation therapy as planned.

## 2011-10-03 ENCOUNTER — Ambulatory Visit
Admission: RE | Admit: 2011-10-03 | Discharge: 2011-10-03 | Disposition: A | Discharge status: Home / Self Care | Payer: BC Managed Care – PPO | Source: Ambulatory Visit | Attending: Radiation Oncology | Admitting: Radiation Oncology

## 2011-10-04 ENCOUNTER — Ambulatory Visit
Admission: RE | Admit: 2011-10-04 | Discharge: 2011-10-04 | Disposition: A | Discharge status: Home / Self Care | Payer: BC Managed Care – PPO | Source: Ambulatory Visit | Attending: Radiation Oncology | Admitting: Radiation Oncology

## 2011-10-05 ENCOUNTER — Ambulatory Visit
Admission: RE | Admit: 2011-10-05 | Discharge: 2011-10-05 | Disposition: A | Discharge status: Home / Self Care | Payer: BC Managed Care – PPO | Source: Ambulatory Visit | Attending: Radiation Oncology | Admitting: Radiation Oncology

## 2011-10-06 ENCOUNTER — Ambulatory Visit
Admission: RE | Admit: 2011-10-06 | Discharge: 2011-10-06 | Disposition: A | Discharge status: Home / Self Care | Payer: BC Managed Care – PPO | Source: Ambulatory Visit | Attending: Radiation Oncology | Admitting: Radiation Oncology

## 2011-10-09 ENCOUNTER — Ambulatory Visit
Admission: RE | Admit: 2011-10-09 | Discharge: 2011-10-09 | Disposition: A | Discharge status: Home / Self Care | Payer: BC Managed Care – PPO | Source: Ambulatory Visit | Attending: Radiation Oncology | Admitting: Radiation Oncology

## 2011-10-09 ENCOUNTER — Telehealth: Payer: Self-pay | Admitting: *Deleted

## 2011-10-09 ENCOUNTER — Encounter: Payer: Self-pay | Admitting: Radiation Oncology

## 2011-10-09 VITALS — BP 108/72 | HR 77 | Resp 18 | Wt 163.0 lb

## 2011-10-09 DIAGNOSIS — C50419 Malignant neoplasm of upper-outer quadrant of unspecified female breast: Secondary | ICD-10-CM

## 2011-10-09 NOTE — Progress Notes (Signed)
Patient presents to the clinic today accompanied by her friend for an under treat visit with Dr. Dayton Scrape. Patient is alert and orietned to person, place, and time. No distress noted. Steady gait noted. Pleasant affect noted. Patient reports bilateral aching leg pain 8 on a scale of 0-10. Patient reports taking Aleve with no relief. Patient reports that she will begin tamoxifen after radiation therapy. Patient reports tanning only of left breast. Reported all findings to Dr. Dayton Scrape.

## 2011-10-09 NOTE — Progress Notes (Signed)
Weekly Management Note:  Site:L Breast Current Dose:  2700  cGy Projected Dose: 6240  cGy  Narrative: The patient is seen today for routine under treatment assessment. CBCT/MVCT images/port films were reviewed. The chart was reviewed.   No complaints today. She uses Radioplex gel when necessary.  Physical Examination:  Filed Vitals:   10/09/11 1048  BP: 108/72  Pulse: 77  Resp: 18  .  Weight: 163 lb (73.936 kg). There is hyperpigmentation of the skin with no areas of desquamation.  Impression: Tolerating radiation therapy well.  Plan: Continue radiation therapy as planned.

## 2011-10-09 NOTE — Telephone Encounter (Signed)
Patient called with continuing c/o leg pain.  She saw Dr. Dayton Scrape today and he suggested that she call Dr. Darnelle Catalan and discuss with him  She has had this awhile and called about it a few weeks ago.and  she feels like it is getting worse.  It is in both legs from her ankle to her hip.  She rates it a 6 or 7.  It is more of a dull ache and a stiffness.  It greatly effects her movement.  She has tried advil and aleve with no improvement.  Will  Discuss with Dr. Darnelle Catalan.

## 2011-10-10 ENCOUNTER — Telehealth: Payer: Self-pay | Admitting: Radiation Oncology

## 2011-10-10 ENCOUNTER — Ambulatory Visit
Admission: RE | Admit: 2011-10-10 | Discharge: 2011-10-10 | Disposition: A | Discharge status: Home / Self Care | Payer: BC Managed Care – PPO | Source: Ambulatory Visit | Attending: Radiation Oncology | Admitting: Radiation Oncology

## 2011-10-10 NOTE — Telephone Encounter (Signed)
Patient phoned this writer this morning. Patient expressed her family has offered to buy her plane ticket home for [place] but this would require her to miss approximately five days of treatment. Strongly discourage her from missing any treatment. Patient requested to speak with Dr. Dayton Scrape to find compromise. Instructed patient that Dr. Dayton Scrape would be glad to see her following her treatment today to discuss this matter. Patient expressed appreciation and understanding.

## 2011-10-11 ENCOUNTER — Ambulatory Visit
Admission: RE | Admit: 2011-10-11 | Discharge: 2011-10-11 | Disposition: A | Discharge status: Home / Self Care | Payer: BC Managed Care – PPO | Source: Ambulatory Visit | Attending: Radiation Oncology | Admitting: Radiation Oncology

## 2011-10-11 ENCOUNTER — Other Ambulatory Visit: Payer: Self-pay | Admitting: Oncology

## 2011-10-12 ENCOUNTER — Telehealth: Payer: Self-pay | Admitting: *Deleted

## 2011-10-12 ENCOUNTER — Ambulatory Visit
Admission: RE | Admit: 2011-10-12 | Discharge: 2011-10-12 | Disposition: A | Discharge status: Home / Self Care | Payer: BC Managed Care – PPO | Source: Ambulatory Visit | Attending: Radiation Oncology | Admitting: Radiation Oncology

## 2011-10-12 NOTE — Telephone Encounter (Signed)
This RN spoke with pt per her call earlier this week with MD recommendation to stop by at radiation appt for RN/MD to assess discomfort.  Pt verbalized understanding.

## 2011-10-13 ENCOUNTER — Ambulatory Visit (HOSPITAL_BASED_OUTPATIENT_CLINIC_OR_DEPARTMENT_OTHER): Payer: BC Managed Care – PPO | Admitting: Lab

## 2011-10-13 ENCOUNTER — Telehealth: Payer: Self-pay | Admitting: *Deleted

## 2011-10-13 ENCOUNTER — Ambulatory Visit
Admission: RE | Admit: 2011-10-13 | Discharge: 2011-10-13 | Disposition: A | Discharge status: Home / Self Care | Payer: BC Managed Care – PPO | Source: Ambulatory Visit | Attending: Radiation Oncology | Admitting: Radiation Oncology

## 2011-10-13 ENCOUNTER — Other Ambulatory Visit: Payer: Self-pay | Admitting: Oncology

## 2011-10-13 ENCOUNTER — Ambulatory Visit (HOSPITAL_COMMUNITY)
Admission: RE | Admit: 2011-10-13 | Discharge: 2011-10-13 | Disposition: A | Discharge status: Home / Self Care | Payer: BC Managed Care – PPO | Source: Ambulatory Visit | Attending: Oncology | Admitting: Oncology

## 2011-10-13 DIAGNOSIS — M79609 Pain in unspecified limb: Secondary | ICD-10-CM | POA: Insufficient documentation

## 2011-10-13 DIAGNOSIS — C50919 Malignant neoplasm of unspecified site of unspecified female breast: Secondary | ICD-10-CM

## 2011-10-13 LAB — CBC WITH DIFFERENTIAL/PLATELET
Basophils Absolute: 0 10*3/uL (ref 0.0–0.1)
Eosinophils Absolute: 0.2 10*3/uL (ref 0.0–0.5)
HGB: 10.8 g/dL — ABNORMAL LOW (ref 11.6–15.9)
LYMPH%: 15.9 % (ref 14.0–49.7)
MCH: 25.9 pg (ref 25.1–34.0)
MCV: 80 fL (ref 79.5–101.0)
MONO%: 14.2 % — ABNORMAL HIGH (ref 0.0–14.0)
NEUT#: 2.9 10*3/uL (ref 1.5–6.5)
Platelets: 231 10*3/uL (ref 145–400)
RBC: 4.16 10*6/uL (ref 3.70–5.45)

## 2011-10-13 LAB — COMPREHENSIVE METABOLIC PANEL
Alkaline Phosphatase: 57 U/L (ref 39–117)
BUN: 19 mg/dL (ref 6–23)
Glucose, Bld: 96 mg/dL (ref 70–99)
Total Bilirubin: 0.2 mg/dL — ABNORMAL LOW (ref 0.3–1.2)

## 2011-10-13 LAB — CK: Total CK: 85 U/L (ref 7–177)

## 2011-10-13 NOTE — Progress Notes (Signed)
Bilateral lower extremity venous duplex completed.  Preliminary report is negative for DVT, SVT, or a Baker's cyst. 

## 2011-10-13 NOTE — Progress Notes (Signed)
John call today regarding continuing problems with pain in her legs. I asked her to drop in for an exam. The pain in the legs is mostly below the knees, both anteriorly and in the [place]. There there is some numbness and tingling in the toe tips, residual from her prior chemotherapy.  On exam there is no lower extremity edema bilaterally. She has 5 over 5 strength bilaterally at knee extension and dorsiflexion. Knee jerks are normal bilaterally.  I am not sure why she is having this pain. I am going to set her up for Dopplers today. I expect this to be negative. I am also obtaining some baseline labs including a CK. I am starting her on gabapentin to take at 300 mg 4 times a day. She will followup with [place] in one week. She knows to call for any problems that may develop before that

## 2011-10-13 NOTE — Telephone Encounter (Signed)
gave patient appointment for 10-16-2011 at 11:00am sent patient to the lab today

## 2011-10-16 ENCOUNTER — Ambulatory Visit
Admission: RE | Admit: 2011-10-16 | Discharge: 2011-10-16 | Disposition: A | Discharge status: Home / Self Care | Payer: BC Managed Care – PPO | Source: Ambulatory Visit | Attending: Radiation Oncology | Admitting: Radiation Oncology

## 2011-10-16 ENCOUNTER — Ambulatory Visit (HOSPITAL_BASED_OUTPATIENT_CLINIC_OR_DEPARTMENT_OTHER): Payer: BC Managed Care – PPO | Admitting: Physician Assistant

## 2011-10-16 ENCOUNTER — Encounter: Payer: Self-pay | Admitting: Radiation Oncology

## 2011-10-16 ENCOUNTER — Telehealth: Payer: Self-pay | Admitting: Oncology

## 2011-10-16 ENCOUNTER — Encounter: Payer: Self-pay | Admitting: Physician Assistant

## 2011-10-16 VITALS — BP 107/73 | HR 77 | Temp 98.5°F | Ht 65.0 in | Wt 157.4 lb

## 2011-10-16 VITALS — BP 111/73 | HR 75 | Resp 18 | Wt 158.4 lb

## 2011-10-16 DIAGNOSIS — C50419 Malignant neoplasm of upper-outer quadrant of unspecified female breast: Secondary | ICD-10-CM

## 2011-10-16 MED ORDER — TRAMADOL HCL 50 MG PO TABS: 50.0000 mg | ORAL_TABLET | Freq: Four times a day (QID) | ORAL | Status: DC | PRN

## 2011-10-16 NOTE — Progress Notes (Signed)
Patient presents to the clinic today unaccompanied for under treat visit with Dr. Dayton Scrape. Patient is alert and oriented to person, place, and time. No distress noted. Steady gait noted. Pleasant affect noted. Patient reports bilateral leg pain 6 on a scale of 0-10. Patient reports that the Neurontin 300 mg qid that Dr. Darnelle Catalan put her on Friday is helping a little. Patient reports that Dr. Darnelle Catalan sent her Friday for an ultrasound which proved she did not have any clots then, he did blood work. Patient will see Dr. Darnelle Catalan to review blood work and discuss Neurontin. Patient reports that he left/treated breast is "just dark" without breaks. Patient denies nausea, vomiting, headache, dizziness, or diarrhea. Patient has no complaints at this time. Reported all findings to Dr. Dayton Scrape.

## 2011-10-16 NOTE — Progress Notes (Signed)
Weekly Management Note:  Site:L Breast Current Dose:  3600  cGy Projected Dose: 6240  cGy  Narrative: The patient is seen today for routine under treatment assessment. CBCT/MVCT images/port films were reviewed. The chart was reviewed.   She was placed on Neurontin by Dr. Darnelle Catalan for neuralgia/myalgias. She uses Radioplex gel  Physical Examination:  Filed Vitals:   10/16/11 1043  BP: 111/73  Pulse: 75  Resp: 18  .  Weight: 158 lb 6.4 oz (71.85 kg). There is moderate hyperpigmentation of the skin along the left breast with no areas of desquamation.  Impression: Tolerating radiation therapy well.  Plan: Continue radiation therapy as planned.

## 2011-10-16 NOTE — Telephone Encounter (Signed)
gve the pt her April 2013 appt calendar 

## 2011-10-16 NOTE — Progress Notes (Signed)
ID: Erin Castillo   DOB: 12/16/1965  MR#: [id number]  CSN#:[id number]  HISTORY OF PRESENT ILLNESS: Erin Castillo is a [place] woman the age of 46 was referred by [name] for evaluation of left breast carcinoma.  The patient noticed a dimple in the lateral left breast and was able to palpate a mass. She waited a couple of months since she is still premenopausal, but the mass did not resolve. She brought it to the attention of [name] and underwent bilateral diagnostic mammography and left ultrasonography at [facility] on 04/20/2011. Compression views as well standard CC and MLO views showed a spiculated mass in the upper outer quadrant of the left breast measuring 2.1 cm. By ultrasound, this was a lobulated, irregular, and hypoechoic mass measuring 1.8 cm. No abnormal appearing lymph nodes in the axilla.  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 The tumor was ER positive at 98%, PR positive at 100%, HER-2/neu negative with a CISH ratio of 1.42, and an MIB-1 of 9%.  The patient underwent bilateral breast MRI on 10-12, confirming a 2.3 cm irregularly marginated, enhancing mass, deep in the upper outer quadrant of the left breast. This was close to the underlying pectoralis major, but without definite extension into the muscle. No other suspicious areas in either breast, and no abnormal appearing lymph nodes. An incidental 1.1 cm anterior liver cyst was noted.  The patient is status post left lumpectomy and sentinel lymph node sampling on 05/15/2011 under the care of [name]. 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 No evidence of lymphovascular invasion. One of one sentinel lymph nodes was involved however.  An Oncotype DX had already been requested, since initially the patient appeared to be node negative. This showed the  patient to be slightly on the low risk side.  Patient is known to be BRCA 1 and 2 negative.  The patient received one dose of docetaxel, doxorubicin, and cyclophosphamide on 06/22/2011 with poor tolerance. Upon review of the Oncotype results, especially when taking into consideration the patient's poor tolerance of the first cycle of chemotherapy, the decision was made to discontinue the doxorubicin, and proceed with 3 additional cycles of docetaxel and cyclophosphamide given on a Q. three-week basis, for a total of 4 cycles of adjuvant chemotherapy, followed by radiation therapy.  INTERVAL HISTORY: [name] returns today for brief followup visit. She was seen by Dr. Darnelle Catalan in the office on Friday for an exam of her lower extremities. She's been complaining of pain in the lower extremities for the last several weeks, and notes that it had been increasing. It affects both legs, it affects the entire lower extremity from the hip down to the ankle. She describes this as a "dull ache". This is affecting her day-to-day activities. Dr. Darnelle Catalan began her on gabapentin on Friday, 300 mg by mouth 4 times a day. She has seen some relief in the pain since that time, decreasing from a 7 to a 5 on a scale of 1-10 today.  She's had no significant swelling. Dopplers obtained on 10/13/2011 were negative for DVT.  REVIEW OF SYSTEMS: Otherwise, [name] has mild fatigue. Her skin is darkening from the radiation, but no additional skin changes have been noted. She's had no recent illnesses and denies fevers, chills, or night sweats. She has noticed some pain in her wrists bilaterally, but denies any swelling and  has had no recent injuries. No significant pain elsewhere. No abnormal headaches.  A detailed review of systems is otherwise noncontributory.   PAST MEDICAL HISTORY: Past Medical History  Diagnosis Date  . Migraines   . Anxiety   . Depression   . Breast cancer     Left    PAST SURGICAL HISTORY: Past  Surgical History  Procedure Date  . Nasal sinus surgery 1991  . Cesarean section 2000  . Breast lumpectomy 05/15/11    left breast lumpectomy   . Sinus exploration     remote  . Mastectomy partial / lumpectomy w/ axillary lymphadenectomy 10/12    snbx  . Portacath placement 06/12/2011    Procedure: INSERTION PORT-A-CATH;  Surgeon: Mariella Saa, MD;  Location: [facility];  Service: General;  Laterality: Right;  Right Subclavian Vein    FAMILY HISTORY Family History  Problem Relation Age of Onset  . Cancer Father     bladder cancer   GYNECOLOGIC HISTORY: She had menarche at age 76. The patient is GX, P3. She understands that the fact that she did not deliver a child before the age of 64 does double her risk of breast cancer developing. She stopped having periods after her first chemotherapy, November of 2012.    SOCIAL HISTORY: She works as an Print production planner for [employer]. Her friend, [name], who actually heads the board for [employer], often accompanies Erin Castillo to her appointments. The patient is separated from her husband, [name], who works at [employer]. They are not officially divorced, however. She understands that currently unless she fills out a healthcare power of attorney, [name] would be her healthcare power of attorney if something happened and she was not able to tell us her medical wishes. Children are Molli Hazard, age 71, Rayfield Citizen, age 8, and [name], age 65. The patient attends [employer].    ADVANCED DIRECTIVES:  HEALTH MAINTENANCE: History  Substance Use Topics  . Smoking status: Never Smoker   . Smokeless tobacco: Never Used  . Alcohol Use: 0.0 oz/week    2-3 Glasses of wine per week     Colonoscopy:  PAP:  Bone density:  Lipid panel:  No Known Allergies  Current Outpatient Prescriptions  Medication Sig Dispense Refill  . b complex vitamins tablet Take 1 tablet by mouth daily.        . citalopram  (CELEXA) 20 MG tablet Take 20 mg by mouth daily.        Marland Kitchen gabapentin (NEURONTIN) 300 MG capsule Take 300 mg by mouth 4 (four) times daily.      . naproxen sodium (ANAPROX) 220 MG tablet Take 220 mg by mouth 2 (two) times daily with a meal.      . acetaminophen (TYLENOL) 325 MG tablet Take 650 mg by mouth every 6 (six) hours as needed.        . diphenhydramine-acetaminophen (TYLENOL PM) 25-500 MG TABS Take 1 tablet by mouth at bedtime as needed.      . docusate sodium (COLACE) 100 MG capsule Take 100 mg by mouth daily as needed.      . traMADol (ULTRAM) 50 MG tablet Take 1 tablet (50 mg total) by mouth every 6 (six) hours as needed for pain.  60 tablet  0    OBJECTIVE: Filed Vitals:   10/16/11 1140  BP: 107/73  Pulse: 77  Temp: 98.5 F (36.9 C)     Body mass index is 26.19 kg/(m^2).  ECOG FS: 1  Extremities, no peripheral edema or cyanosis. Remainder of physical exam was deferred today.   LAB RESULTS: Lab Results  Component Value Date   WBC 4.3 10/13/2011   NEUTROABS 2.9 10/13/2011   HGB 10.8* 10/13/2011   HCT 33.3* 10/13/2011   MCV 80.0 10/13/2011   PLT 231 10/13/2011      Chemistry      Component Value Date/Time   NA 143 10/13/2011 1124   K 4.2 10/13/2011 1124   CL 106 10/13/2011 1124   CO2 30 10/13/2011 1124   BUN 19 10/13/2011 1124   CREATININE 0.55 10/13/2011 1124      Component Value Date/Time   CALCIUM 9.4 10/13/2011 1124   ALKPHOS 57 10/13/2011 1124   AST 24 10/13/2011 1124   ALT 21 10/13/2011 1124   BILITOT 0.2* 10/13/2011 1124       Lab Results  Component Value Date   LABCA2 21 04/26/2011     STUDIES:  10/13/2011 Bilateral Lower Extremity Venous Duplex Evaluation  Patient: Latifah, Padin MR #: [id number] Study Date: 10/13/2011 Gender: F Age: 60 Height: Weight: BSA: Pt. Status: Room:  ORDERING Magrinat, Cicero Duck SONOGRAPHER Alesia Banda, RVT Reports also to:  ------------------------------------------------------------ History and  indications:  Indications  729.5 Pain in limb.  ------------------------------------------------------------ Study information:  Add-on. Study status: Routine. Procedure: A vascular evaluation was performed with the patient in the supine position. The right common femoral, right femoral, right greater saphenous, right profunda femoral, right popliteal, right peroneal, right posterior tibial, left common femoral, left femoral, left greater saphenous, left profunda femoral, left popliteal, left peroneal, and left posterior tibial veins were studied. Image quality was adequate. Bilateral lower extremity venous duplex evaluation. Doppler flow study including B-mode compression maneuvers of all visualized segments, color flow Doppler and selected views of pulsed wave Doppler. Location: Vascular laboratory. Patient status: Outpatient. Venous flow:  +--------------------------+-------+----------------------+ Location OverallFlow properties  +--------------------------+-------+----------------------+ Right common femoral Patent Phasic; spontaneous;    compressible  +--------------------------+-------+----------------------+ Right femoral Patent Compressible  +--------------------------+-------+----------------------+ Right profunda femoral Patent Compressible  +--------------------------+-------+----------------------+ Right popliteal Patent Phasic; spontaneous;    compressible  +--------------------------+-------+----------------------+ Right posterior tibial Patent Compressible  +--------------------------+-------+----------------------+ Right peroneal Patent Compressible  +--------------------------+-------+----------------------+ Right saphenofemoral Patent Compressible  junction    +--------------------------+-------+----------------------+ Right greater saphenous Patent Compressible   +--------------------------+-------+----------------------+ Left common femoral Patent Phasic; spontaneous;    compressible  +--------------------------+-------+----------------------+ Left femoral Patent Compressible  +--------------------------+-------+----------------------+ Left profunda femoral Patent Compressible  +--------------------------+-------+----------------------+ Left popliteal Patent Phasic; spontaneous;    compressible  +--------------------------+-------+----------------------+ Left posterior tibial Patent Compressible  +--------------------------+-------+----------------------+ Left peroneal Patent Compressible  +--------------------------+-------+----------------------+ Left saphenofemoral Patent Compressible  junction    +--------------------------+-------+----------------------+ Left greater saphenous Patent Compressible  +--------------------------+-------+----------------------+  ------------------------------------------------------------ Summary:  - No evidence of deep vein or superficial thrombosis involving the right lower extremity and left lower extremity. - No evidence of Baker's cyst on the right or left. Other specific details can be found in the table(s) above.   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 The tumor was grade 2, strongly estrogen and progesterone receptor positive, with an MIB-1 of 9% and no HER-2/neu amplification.  (2) Treated initially with docetaxel, doxorubicin, and cyclophosphamide for one cycle, with very poor tolerance  (3) status post cyclophosphamide/docetaxel, for an additional three cycles completed January 2013 (4)  Now receiving radiation  therapy  PLAN: Martika has seen some improvement with gabapentin and will continue at 300 mg 4 times daily. I have also given her prescription for tramadol, 50 mg every 6 hours as needed, but especially in the evenings when she gets home from work. I will see her back in a couple of weeks to reassess her leg pain. She'll be finishing up with radiation therapy at that time and we will also discuss beginning her anti-estrogen therapy.  Patient voices understanding and agreement with this plan, and will call with any changes or problems.   Mathew Postiglione    10/16/2011

## 2011-10-17 ENCOUNTER — Ambulatory Visit
Admission: RE | Admit: 2011-10-17 | Discharge: 2011-10-17 | Disposition: A | Discharge status: Home / Self Care | Payer: BC Managed Care – PPO | Source: Ambulatory Visit | Attending: Radiation Oncology | Admitting: Radiation Oncology

## 2011-10-18 ENCOUNTER — Ambulatory Visit
Admission: RE | Admit: 2011-10-18 | Discharge: 2011-10-18 | Disposition: A | Discharge status: Home / Self Care | Payer: BC Managed Care – PPO | Source: Ambulatory Visit | Attending: Radiation Oncology | Admitting: Radiation Oncology

## 2011-10-18 ENCOUNTER — Encounter: Payer: Self-pay | Admitting: Radiation Oncology

## 2011-10-18 NOTE — Progress Notes (Signed)
Electron beam simulation note  The patient underwent virtual simulation for her left breast electron beam boost. She was set up en face. One custom block was constructed to shield the normal surrounding structures. A special port plan was requested. I prescribing 1200 cGy in 6 sessions utilizing 15 MEV electrons. Electron beam energy was chosen based on the depth of her tumor bed as seen on her CT scan.

## 2011-10-19 ENCOUNTER — Ambulatory Visit
Admission: RE | Admit: 2011-10-19 | Discharge: 2011-10-19 | Disposition: A | Discharge status: Home / Self Care | Payer: BC Managed Care – PPO | Source: Ambulatory Visit | Attending: Radiation Oncology | Admitting: Radiation Oncology

## 2011-10-19 ENCOUNTER — Other Ambulatory Visit: Payer: Self-pay | Admitting: Oncology

## 2011-10-20 ENCOUNTER — Ambulatory Visit
Admission: RE | Admit: 2011-10-20 | Discharge: 2011-10-20 | Disposition: A | Discharge status: Home / Self Care | Payer: BC Managed Care – PPO | Source: Ambulatory Visit | Attending: Radiation Oncology | Admitting: Radiation Oncology

## 2011-10-23 ENCOUNTER — Ambulatory Visit
Admission: RE | Admit: 2011-10-23 | Discharge: 2011-10-23 | Disposition: A | Discharge status: Home / Self Care | Payer: BC Managed Care – PPO | Source: Ambulatory Visit | Attending: Radiation Oncology | Admitting: Radiation Oncology

## 2011-10-23 ENCOUNTER — Encounter: Payer: Self-pay | Admitting: Radiation Oncology

## 2011-10-23 ENCOUNTER — Ambulatory Visit: Payer: BC Managed Care – PPO | Admitting: Radiation Oncology

## 2011-10-23 VITALS — BP 117/72 | HR 82 | Resp 18 | Wt 157.4 lb

## 2011-10-23 DIAGNOSIS — C50419 Malignant neoplasm of upper-outer quadrant of unspecified female breast: Secondary | ICD-10-CM

## 2011-10-23 NOTE — Progress Notes (Signed)
Patient presents to the clinic today accompanied by her daughter and son for an under treat visit with Dr. Basilio Cairo. Patient is alert and oriented to person, place, and time. No distress noted. Steady gait noted. Pleasant affect noted. Patient reports that her bilateral leg pain has improved since she was prescribed tramadol and neurontin. Patient reports that the skin of her left/treated breast is "dark" but denies breaks in the skin. Patient has no complaints at this time. Reported all findings to Dr. Basilio Cairo.

## 2011-10-23 NOTE — Progress Notes (Signed)
   Weekly Management Note, left breast cancer Current Dose:   4500 cGy  Projected Dose: 6240 cGy   Narrative:  The patient presents for routine under treatment assessment.  CBCT/MVCT images/Port film x-rays were reviewed.  The chart was checked. She is doing well. She has some hyperpigmentation over her breast. She is using radiaplex. Neurontin has helped her bilateral leg pain   Physical Findings: Weight: 157 lb 6.4 oz (71.396 kg). She has diffuse hyperpigmentation throughout the left breast. It is most concentrated in the axilla. Skin is dry but intact   Impression:  The patient is tolerating radiotherapy.  Plan:  Continue radiotherapy as planned.

## 2011-10-24 ENCOUNTER — Ambulatory Visit
Admission: RE | Admit: 2011-10-24 | Discharge: 2011-10-24 | Disposition: A | Discharge status: Home / Self Care | Payer: BC Managed Care – PPO | Source: Ambulatory Visit | Attending: Radiation Oncology | Admitting: Radiation Oncology

## 2011-10-25 ENCOUNTER — Other Ambulatory Visit: Payer: Self-pay | Admitting: *Deleted

## 2011-10-25 ENCOUNTER — Encounter: Payer: Self-pay | Admitting: *Deleted

## 2011-10-25 ENCOUNTER — Telehealth: Payer: Self-pay | Admitting: *Deleted

## 2011-10-25 ENCOUNTER — Ambulatory Visit
Admission: RE | Admit: 2011-10-25 | Discharge: 2011-10-25 | Disposition: A | Discharge status: Home / Self Care | Payer: BC Managed Care – PPO | Source: Ambulatory Visit | Attending: Radiation Oncology | Admitting: Radiation Oncology

## 2011-10-25 ENCOUNTER — Ambulatory Visit: Payer: BC Managed Care – PPO

## 2011-10-25 NOTE — Progress Notes (Signed)
Pt came in post radiation and informed this RN of continued bilateral wrist discomfort.  Per discussion referral made for pt to be seen at the hand clinic ( Dr Wyonia Hough ).

## 2011-10-25 NOTE — Telephone Encounter (Signed)
made patient appointment for 10-30-2011 arrival 11:30 pm for hand specialist

## 2011-10-26 ENCOUNTER — Ambulatory Visit
Admission: RE | Admit: 2011-10-26 | Discharge: 2011-10-26 | Disposition: A | Discharge status: Home / Self Care | Payer: BC Managed Care – PPO | Source: Ambulatory Visit | Attending: Radiation Oncology | Admitting: Radiation Oncology

## 2011-10-27 ENCOUNTER — Ambulatory Visit
Admission: RE | Admit: 2011-10-27 | Discharge: 2011-10-27 | Disposition: A | Discharge status: Home / Self Care | Payer: BC Managed Care – PPO | Source: Ambulatory Visit | Attending: Radiation Oncology | Admitting: Radiation Oncology

## 2011-10-30 ENCOUNTER — Encounter: Payer: Self-pay | Admitting: Radiation Oncology

## 2011-10-30 ENCOUNTER — Ambulatory Visit
Admission: RE | Admit: 2011-10-30 | Discharge: 2011-10-30 | Disposition: A | Discharge status: Home / Self Care | Payer: BC Managed Care – PPO | Source: Ambulatory Visit | Attending: Radiation Oncology | Admitting: Radiation Oncology

## 2011-10-30 VITALS — BP 91/51 | HR 78 | Resp 18 | Wt 155.2 lb

## 2011-10-30 DIAGNOSIS — C50419 Malignant neoplasm of upper-outer quadrant of unspecified female breast: Secondary | ICD-10-CM

## 2011-10-30 NOTE — Progress Notes (Signed)
Patient presents to the clinic today unaccompanied for under treat visit with Dr. Dayton Scrape. Patient is alert and oriented to person, place, and time. No distress noted. Steady gait noted. Patient reports constant aching left pain 4 on a scale of 0-10. Patient reports tramadol and neurotin is helping to relieve this pain. Patient reports her left/treated breast is darker. Patient planning to move this weekend and has been packing most of this weekend. Reported all findings to Dr. Dayton Scrape.

## 2011-10-30 NOTE — Progress Notes (Signed)
Weekly Management Note:  Site:L Breast Current Dose:  5040  cGy Projected Dose: 6240  cGy  Narrative: The patient is seen today for routine under treatment assessment. CBCT/MVCT images/port films were reviewed. The chart was reviewed.   New complaints today. She continues with her Radioplex gel. Her lower extremity pain is better on tramadol and Neurontin.  Physical Examination:  Filed Vitals:   10/30/11 1052  BP: 91/51  Pulse: 78  Resp: 18  .  Weight: 155 lb 3.2 oz (70.398 kg). There is marked hyperpigmentation the skin along the left breast, particularly along the left axilla and inframammary region. There is patchy dry desquamation.  Impression: Tolerating radiation therapy well.  Plan: Continue radiation therapy as planned. To finish her radiation therapy this Friday. One-month followup visit.

## 2011-10-31 ENCOUNTER — Ambulatory Visit
Admission: RE | Admit: 2011-10-31 | Discharge: 2011-10-31 | Disposition: A | Discharge status: Home / Self Care | Payer: BC Managed Care – PPO | Source: Ambulatory Visit | Attending: Radiation Oncology | Admitting: Radiation Oncology

## 2011-11-01 ENCOUNTER — Encounter: Payer: Self-pay | Admitting: Physician Assistant

## 2011-11-01 ENCOUNTER — Ambulatory Visit (HOSPITAL_BASED_OUTPATIENT_CLINIC_OR_DEPARTMENT_OTHER): Payer: BC Managed Care – PPO | Admitting: Physician Assistant

## 2011-11-01 ENCOUNTER — Telehealth: Payer: Self-pay | Admitting: *Deleted

## 2011-11-01 ENCOUNTER — Ambulatory Visit
Admission: RE | Admit: 2011-11-01 | Discharge: 2011-11-01 | Disposition: A | Discharge status: Home / Self Care | Payer: BC Managed Care – PPO | Source: Ambulatory Visit | Attending: Radiation Oncology | Admitting: Radiation Oncology

## 2011-11-01 VITALS — BP 102/67 | HR 76 | Temp 97.9°F | Ht 65.0 in | Wt 155.3 lb

## 2011-11-01 DIAGNOSIS — Z170 Estrogen receptor positive status [ER+]: Secondary | ICD-10-CM

## 2011-11-01 DIAGNOSIS — C50419 Malignant neoplasm of upper-outer quadrant of unspecified female breast: Secondary | ICD-10-CM

## 2011-11-01 DIAGNOSIS — M79609 Pain in unspecified limb: Secondary | ICD-10-CM

## 2011-11-01 NOTE — Telephone Encounter (Signed)
gave patient appointments for labs only 11-27-2011 at 4:00pm called dr.hoxworth office left voice message about getting the port a cath removed

## 2011-11-01 NOTE — Progress Notes (Signed)
ID: Erin Castillo   DOB: 1965/10/14  MR#: [id number]  CSN#:[id number]  HISTORY OF PRESENT ILLNESS: Erin Castillo is a [place] woman the age of 46 was referred by ErinRick Castillo for evaluation of left breast carcinoma.  The patient noticed a dimple in the lateral left breast and was able to palpate a mass. She waited a couple of months since she is still premenopausal, but the mass did not resolve. She brought it to the attention of Dr. Dion Castillo and underwent bilateral diagnostic mammography and left ultrasonography at [facility] on 04/20/2011. Compression views as well standard CC and MLO views showed a spiculated mass in the upper outer quadrant of the left breast measuring 2.1 cm. By ultrasound, this was a lobulated, irregular, and hypoechoic mass measuring 1.8 cm. No abnormal appearing lymph nodes in the axilla.  Biopsy was performed on 04/20/2011 [id number]) showing an invasive ductal carcinoma which on the pathologic report was described as grade 2. The tumor was ER positive at 98%, PR positive at 100%, HER-2/neu negative with a CISH ratio of 1.42, and an MIB-1 of 9%.  The patient underwent bilateral breast MRI on 10-12, confirming a 2.3 cm irregularly marginated, enhancing mass, deep in the upper outer quadrant of the left breast. This was close to the underlying pectoralis major, but without definite extension into the muscle. No other suspicious areas in either breast, and no abnormal appearing lymph nodes. An incidental 1.1 cm anterior liver cyst was noted.  The patient is status post left lumpectomy and sentinel lymph node sampling on 05/15/2011 under the care of Erin Castillo. 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 No evidence of lymphovascular invasion. One of one sentinel lymph nodes was involved however.  An Oncotype DX had already been requested, since initially the patient appeared to be node negative. This showed the  patient to be slightly on the low risk side.  Patient is known to be BRCA 1 and 2 negative.  The patient received one dose of docetaxel, doxorubicin, and cyclophosphamide on 06/22/2011 with poor tolerance. Upon review of the Oncotype results, especially when taking into consideration the patient's poor tolerance of the first cycle of chemotherapy, the decision was made to discontinue the doxorubicin, and proceed with 3 additional cycles of docetaxel and cyclophosphamide given on a Q. three-week basis, for a total of 4 cycles of adjuvant chemotherapy, followed by radiation therapy.  INTERVAL HISTORY: Erin Castillo returns today for brief followup visit. She continues to receive radiation therapy, and is scheduled for her last radiation treatment this week on April 12. She is ready to initiate tamoxifen which she already has at home.  Interval history is remarkable for Erin Castillo having seen a local hand specialist for the pain in her hands and wrists. X-rays showed inflammation, per her report. The doctor gave her soft splints to wear at night, and this is helping slightly. She continues to have leg pain and is taking the gabapentin, with tramadol taken at bedtime. This leg pain continues, but has slightly decreased since her last visit here on 10/16/2011.   REVIEW OF SYSTEMS: Otherwise, Erin Castillo continues to complain of mild fatigue. She has had no recent illnesses, no fevers, chills, or night sweats. She's eating and drinking well with no nausea or change in bowel habits. No chest pain or shortness of breath. No abnormal headaches or dizziness. No peripheral swelling.  A detailed review of systems is otherwise noncontributory.   PAST MEDICAL HISTORY: Past Medical  History  Diagnosis Date  . Migraines   . Anxiety   . Depression   . Breast cancer     Left    PAST SURGICAL HISTORY: Past Surgical History  Procedure Date  . Nasal sinus surgery 1991  . Cesarean section 2000  . Breast lumpectomy 05/15/11     left breast lumpectomy   . Sinus exploration     remote  . Mastectomy partial / lumpectomy w/ axillary lymphadenectomy 10/12    snbx  . Portacath placement 06/12/2011    Procedure: INSERTION PORT-A-CATH;  Surgeon: Erin Saa, MD;  Location: [facility];  Service: General;  Laterality: Right;  Right Subclavian Vein    FAMILY HISTORY Family History  Problem Relation Age of Onset  . Cancer Father     bladder cancer   GYNECOLOGIC HISTORY: She had menarche at age 46. The patient is GX, P3. She understands that the fact that she did not deliver a child before the age of 89 does double her risk of breast cancer developing. She stopped having periods after her first chemotherapy, November of 2012.    SOCIAL HISTORY: She works as an Print production planner for [employer]. Her friend, Erin Castillo, who actually heads the board for [employer], often accompanies Erin Castillo to her appointments. The patient is separated from her husband, Erin Castillo, who works at [employer]. They are not officially divorced, however. She understands that currently unless she fills out a healthcare power of attorney, Erin Castillo would be her healthcare power of attorney if something happened and she was not able to tell us her medical wishes. Children are Erin Castillo, age 48, Erin Castillo, age 58, and Erin Castillo, age 53. The patient attends [employer].    ADVANCED DIRECTIVES:  HEALTH MAINTENANCE: History  Substance Use Topics  . Smoking status: Never Smoker   . Smokeless tobacco: Never Used  . Alcohol Use: 0.0 oz/week    2-3 Glasses of wine per week     Colonoscopy:  PAP:  Bone density:  Lipid panel:  No Known Allergies  Current Outpatient Prescriptions  Medication Sig Dispense Refill  . acetaminophen (TYLENOL) 325 MG tablet Take 650 mg by mouth every 6 (six) hours as needed.        Marland Kitchen b complex vitamins tablet Take 1 tablet by mouth daily.        . citalopram (CELEXA) 20 MG tablet Take  20 mg by mouth daily.        . diphenhydramine-acetaminophen (TYLENOL PM) 25-500 MG TABS Take 1 tablet by mouth at bedtime as needed.      . docusate sodium (COLACE) 100 MG capsule Take 100 mg by mouth daily as needed.      . doxycycline (VIBRA-TABS) 100 MG tablet TAKE AS DIRECTED  60 tablet  2  . gabapentin (NEURONTIN) 300 MG capsule Take 300 mg by mouth 4 (four) times daily.      . naproxen sodium (ANAPROX) 220 MG tablet Take 220 mg by mouth 2 (two) times daily with a meal.      . traMADol (ULTRAM) 50 MG tablet Take 1 tablet (50 mg total) by mouth every 6 (six) hours as needed for pain.  60 tablet  0    OBJECTIVE: Filed Vitals:   11/01/11 1111  BP: 102/67  Pulse: 76  Temp: 97.9 F (36.6 C)     Castillo mass index is 25.84 kg/(m^2).    ECOG FS: 1  Physical Exam: HEENT:  Sclerae anicteric, conjunctivae pink.  Nodes:  No cervical, supraclavicular, or axillary lymphadenopathy palpated.  Breast Exam:  Deferred  Lungs:  Clear to auscultation bilaterally.  No crackles, rhonchi, or wheezes.   Heart:  Regular rate and rhythm.   Abdomen:  Soft, nontender.  Positive bowel sounds.  No organomegaly or masses palpated.   Musculoskeletal:  No focal spinal tenderness to palpation.  Extremities:  Benign.  No peripheral edema or cyanosis.   Skin:  Benign.   Neuro:  Nonfocal.   LAB RESULTS: Lab Results  Component Value Date   WBC 4.3 10/13/2011   NEUTROABS 2.9 10/13/2011   HGB 10.8* 10/13/2011   HCT 33.3* 10/13/2011   MCV 80.0 10/13/2011   PLT 231 10/13/2011      Chemistry      Component Value Date/Time   NA 143 10/13/2011 1124   K 4.2 10/13/2011 1124   CL 106 10/13/2011 1124   CO2 30 10/13/2011 1124   BUN 19 10/13/2011 1124   CREATININE 0.55 10/13/2011 1124      Component Value Date/Time   CALCIUM 9.4 10/13/2011 1124   ALKPHOS 57 10/13/2011 1124   AST 24 10/13/2011 1124   ALT 21 10/13/2011 1124   BILITOT 0.2* 10/13/2011 1124       Lab Results  Component Value Date   LABCA2 21 04/26/2011      STUDIES:  No recent studies found.  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 The tumor was grade 2, strongly estrogen and progesterone receptor positive, with an MIB-1 of 9% and no HER-2/neu amplification.  (2) Treated initially with docetaxel, doxorubicin, and cyclophosphamide for one cycle, with very poor tolerance  (3) status post cyclophosphamide/docetaxel, for an additional three cycles completed January 2013 (4)  Now receiving radiation therapy, last treatment scheduled for 11/03/2011. (5)  ready to begin tamoxifen, 20 mg daily, after completion of radiation.  PLAN: Tanveer will complete her radiation therapy later this week, and we'll begin on tamoxifen on April 13. She scheduled to return in one month to assess tolerance to the drug, and we will discuss her long-term followup at that visit. In the meanwhile, referring her back to Erin Castillo for port removal.  I have encouraged Sevana to exercise, specifically walk as much as possible. She will continue on the gabapentin and has tramadol to take as needed for pain. She voices her understanding and agreement with our plan and will call with any changes.     Que Meneely    11/01/2011

## 2011-11-02 ENCOUNTER — Encounter: Payer: Self-pay | Admitting: Radiation Oncology

## 2011-11-02 ENCOUNTER — Ambulatory Visit
Admission: RE | Admit: 2011-11-02 | Discharge: 2011-11-02 | Disposition: A | Discharge status: Home / Self Care | Payer: BC Managed Care – PPO | Source: Ambulatory Visit | Attending: Radiation Oncology | Admitting: Radiation Oncology

## 2011-11-02 VITALS — Wt 155.5 lb

## 2011-11-02 DIAGNOSIS — C50419 Malignant neoplasm of upper-outer quadrant of unspecified female breast: Secondary | ICD-10-CM

## 2011-11-02 NOTE — Progress Notes (Signed)
Pt completes tomorrow, gave her FU card. Pt fatigued. Applying Radiaplex to left breast, some dry desquamation reported.

## 2011-11-02 NOTE — Progress Notes (Signed)
Weekly Management Note:  Site:L Breast Current Dose:  6040  cGy Projected Dose: 6240  cGy  Narrative: The patient is seen today for routine under treatment assessment. CBCT/MVCT images/port films were reviewed. The chart was reviewed.   She is without new complaints today.  Physical Examination: There were no vitals filed for this visit..  Weight: 155 lb 8 oz (70.534 kg). There is dry desquamation the skin along her electron beam field with a less than 1 cm area of impending moist desquamation.  Impression: Tolerating radiation therapy well. 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 I instructed her to apply antibiotic ointment twice a day if she develops a moist desquamation.  Plan: Continue radiation therapy as planned. Radiotherapy will be complete tomorrow and she will be scheduled for a one-month followup visit.

## 2011-11-03 ENCOUNTER — Ambulatory Visit
Admission: RE | Admit: 2011-11-03 | Discharge: 2011-11-03 | Disposition: A | Discharge status: Home / Self Care | Payer: BC Managed Care – PPO | Source: Ambulatory Visit | Attending: Radiation Oncology | Admitting: Radiation Oncology

## 2011-11-05 ENCOUNTER — Encounter: Payer: Self-pay | Admitting: Radiation Oncology

## 2011-11-05 NOTE — Progress Notes (Signed)
[facility] Radiation Oncology End of Treatment Note  Name:Erin Castillo  Date: 11/05/2011 NWG:[id number] DOB:1966-07-16   Status:outpatient    CC: Geryl Rankins, MD, MD  Dr. Glenna Fellows, Dr. Marikay Alar Magrinat, Dr. Jeralyn Ruths  REFERRING PHYSICIAN: Dr. 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 15 MEV electrons, left breast boost                NARRATIVE:  The patient tolerated treatment beautifully although she did have patchy dry desquamation of the skin by completion of therapy. She used Radioplex gel during her course of treatment.                          PLAN: Routine followup in one month. Patient instructed to call if questions or worsening complaints in interim.

## 2011-11-24 ENCOUNTER — Telehealth (INDEPENDENT_AMBULATORY_CARE_PROVIDER_SITE_OTHER): Payer: Self-pay | Admitting: General Surgery

## 2011-11-27 ENCOUNTER — Other Ambulatory Visit: Payer: BC Managed Care – PPO | Admitting: Lab

## 2011-11-28 ENCOUNTER — Other Ambulatory Visit (HOSPITAL_BASED_OUTPATIENT_CLINIC_OR_DEPARTMENT_OTHER): Payer: BC Managed Care – PPO | Admitting: Lab

## 2011-11-28 DIAGNOSIS — C50419 Malignant neoplasm of upper-outer quadrant of unspecified female breast: Secondary | ICD-10-CM

## 2011-11-28 LAB — CBC WITH DIFFERENTIAL/PLATELET
Basophils Absolute: 0 10*3/uL (ref 0.0–0.1)
EOS%: 2.3 % (ref 0.0–7.0)
Eosinophils Absolute: 0.1 10*3/uL (ref 0.0–0.5)
HGB: 11.7 g/dL (ref 11.6–15.9)
NEUT#: 2.3 10*3/uL (ref 1.5–6.5)
RDW: 18.3 % — ABNORMAL HIGH (ref 11.2–14.5)
WBC: 3.7 10*3/uL — ABNORMAL LOW (ref 3.9–10.3)
lymph#: 0.7 10*3/uL — ABNORMAL LOW (ref 0.9–3.3)

## 2011-11-28 LAB — COMPREHENSIVE METABOLIC PANEL
Albumin: 4.2 g/dL (ref 3.5–5.2)
Alkaline Phosphatase: 55 U/L (ref 39–117)
Glucose, Bld: 84 mg/dL (ref 70–99)
Potassium: 4 mEq/L (ref 3.5–5.3)
Sodium: 143 mEq/L (ref 135–145)
Total Protein: 6.6 g/dL (ref 6.0–8.3)

## 2011-11-28 LAB — CANCER ANTIGEN 27.29: CA 27.29: 22 U/mL (ref 0–39)

## 2011-11-30 ENCOUNTER — Encounter: Payer: Self-pay | Admitting: Radiation Oncology

## 2011-11-30 DIAGNOSIS — C50919 Malignant neoplasm of unspecified site of unspecified female breast: Secondary | ICD-10-CM | POA: Insufficient documentation

## 2011-12-04 ENCOUNTER — Encounter: Payer: Self-pay | Admitting: Oncology

## 2011-12-04 ENCOUNTER — Ambulatory Visit (HOSPITAL_BASED_OUTPATIENT_CLINIC_OR_DEPARTMENT_OTHER): Payer: BC Managed Care – PPO | Admitting: Oncology

## 2011-12-04 VITALS — BP 119/78 | HR 87 | Temp 98.0°F | Ht 65.0 in | Wt 153.8 lb

## 2011-12-04 DIAGNOSIS — Z170 Estrogen receptor positive status [ER+]: Secondary | ICD-10-CM

## 2011-12-04 DIAGNOSIS — C50419 Malignant neoplasm of upper-outer quadrant of unspecified female breast: Secondary | ICD-10-CM

## 2011-12-04 NOTE — Progress Notes (Signed)
ID: Erin Castillo   DOB: 05-Sep-1965  MR#: [id number]  CSN#:[id number]  HISTORY OF PRESENT ILLNESS: The patient noticed a dimple in the lateral left breast and was able to palpate a mass. She waited a couple of months since shewas still premenopausal, but the mass did not resolve. She brought it to the attention of [name] and underwent bilateral diagnostic mammography and left ultrasonography at [facility] on 04/20/2011. Compression views showed a spiculated mass in the upper outer quadrant of the left breast measuring 2.1 cm. By ultrasound, this was a lobulated, irregular, and hypoechoic mass measuring 1.8 cm. No abnormal appearing lymph nodes in the axilla.  Biopsy was performed on 04/20/2011 [id number]) showing an invasive ductal carcinoma which on the pathology report was described as grade 2. The tumor was ER positive at 98%, PR positive at 100%, HER-2/neu negative with a CISH ratio of 1.42, and an MIB-1 of 9%.  The patient underwent bilateral breast MRI on 10-12, confirming a 2.3 cm irregularly marginated, enhancing mass, deep in the upper outer quadrant of the left breast. This was close to the underlying pectoralis major, but without definite extension into the muscle. No other suspicious areas in either breast, and no abnormal appearing lymph nodes were noted. An incidental 1.1 cm anterior liver cyst was noted.  The patient underwentt left lumpectomy and sentinel lymph node sampling on 05/15/2011 under [name]. 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 No evidence of lymphovascular invasion. One of one sentinel lymph nodes was involved.  An Oncotype DX had already been requested, since initially the patient appeared to be node negative (NCCN guidelines do not recommend this test in node-positive patients). This showed the patient to be on the low risk side. She is known to be BRCA 1 and 2 negative. Her subsequent history is  as detailed below.  INTERVAL HISTORY: Erin Castillo returns today for routine followup of her breast cancer. Since her last visit here she completed her radiation treatments, and started tamoxifen approximately one month ago.  REVIEW OF SYSTEMS: She had mostly dry desquamation and hyperpigmentation from the radiation. She had some fatigue, but she was able to work full-time and then go home and take care of the house and do 3 kids. Since started tamoxifen she is noted no change in her hot flashes. There is some vaginal dryness which also has not changed. She has mild insomnia, a little bit of discomfort which she describes as stinging associated with the left breast incision. Her leg pain is considerably improved. She is currently using gabapentin only once a day and not every day. She has not had a menstrual period since the start of chemotherapy. A detailed review of systems was otherwise noncontributory  PAST MEDICAL HISTORY: Past Medical History  Diagnosis Date  . Migraines   . Anxiety   . Depression   . Breast cancer 04/20/11    Left, ER/PR+  . History of radiation therapy     09/18/11 to 11/03/11, left breast    PAST SURGICAL HISTORY: Past Surgical History  Procedure Date  . Nasal sinus surgery 1991  . Cesarean section 2000  . Breast lumpectomy 05/15/11    left breast lumpectomy   . Sinus exploration     remote  . Mastectomy partial / lumpectomy w/ axillary lymphadenectomy 10/12    snbx  . Portacath placement 06/12/2011    Procedure: INSERTION PORT-A-CATH;  Surgeon: Mariella Saa, MD;  Location: [facility];  Service: General;  Laterality: Right;  Right Subclavian Vein    FAMILY HISTORY Family History  Problem Relation Age of Onset  . Cancer Father     bladder cancer  . Diabetes Father   The patient's father is alive at age 48.  The patient's mother is alive at age 52.  The patient's father did have bladder cancer but has done well with that.  She has 3  sisters, no brothers.  There is no history of breast or ovarian cancer in the family.  GYNECOLOGIC HISTORY: She had menarche at age 74. The patient is GX, P3. She understands that the fact that she did not deliver a child before the age of 54 does double her risk of breast cancer developing. She stopped having periods after her first chemotherapy, November of 2012.    SOCIAL HISTORY: She works as an Print production planner for [employer]. Her friend, Erin Castillo, who actually heads the board for [employer], often accompanies Francetta to her appointments. The patient is separated from her husband, Erin Castillo, who works at [employer]. They are not officially divorced, however. She understands that currently unless she fills out a healthcare power of attorney, Erin Castillo would be her healthcare power of attorney if something happened and she was not able to tell us her medical wishes. Children are Erin Castillo, age 62, Erin Castillo, age 31, and Erin Castillo, age 44. The patient attends [employer].    ADVANCED DIRECTIVES:  HEALTH MAINTENANCE: History  Substance Use Topics  . Smoking status: Never Smoker   . Smokeless tobacco: Never Used  . Alcohol Use: 0.0 oz/week    2-3 Glasses of wine per week     Colonoscopy:  PAP:  Bone density:  Lipid panel:  No Known Allergies  Current Outpatient Prescriptions  Medication Sig Dispense Refill  . acetaminophen (TYLENOL) 325 MG tablet Take 650 mg by mouth every 6 (six) hours as needed.        Marland Kitchen b complex vitamins tablet Take 1 tablet by mouth daily.        . citalopram (CELEXA) 20 MG tablet Take 20 mg by mouth daily.        . diphenhydramine-acetaminophen (TYLENOL PM) 25-500 MG TABS Take 1 tablet by mouth at bedtime as needed.      . gabapentin (NEURONTIN) 300 MG capsule Take 300 mg by mouth 4 (four) times daily.      . naproxen sodium (ANAPROX) 220 MG tablet Take 220 mg by mouth 2 (two) times daily with a meal.      . tamoxifen (NOLVADEX) 20 MG tablet  Take 20 mg by mouth daily.      Marland Kitchen docusate sodium (COLACE) 100 MG capsule Take 100 mg by mouth daily as needed.      . traMADol (ULTRAM) 50 MG tablet Take 1 tablet (50 mg total) by mouth every 6 (six) hours as needed for pain.  60 tablet  0    OBJECTIVE: Middle-aged white woman who appears well Filed Vitals:   12/04/11 1631  BP: 119/78  Pulse: 87  Temp: 98 F (36.7 C)     Body mass index is 25.59 kg/(m^2).    ECOG FS: 1  Physical Exam: HEENT:  Sclerae anicteric, conjunctivae pink.   Nodes:  No cervical, supraclavicular, or axillary lymphadenopathy palpated.  Breast Exam:  The right breast is unremarkable; the left breast is status post radiation, with some hyperpigmentation and dry desquamation. There is no evidence of local recurrence  Lungs:  Clear to auscultation bilaterally.  No crackles, rhonchi, or wheezes.   Heart:  Regular rate and rhythm.   Abdomen:  Soft, nontender.  Positive bowel sounds.  No organomegaly or masses palpated.   Musculoskeletal:  No focal spinal tenderness to palpation.  Extremities:  Benign.  No peripheral edema or cyanosis.   Skin:  Some beau lines still noted across the fingernail  Neuro: Nonfocal.   LAB RESULTS: Lab Results  Component Value Date   WBC 3.7* 11/28/2011   NEUTROABS 2.3 11/28/2011   HGB 11.7 11/28/2011   HCT 36.8 11/28/2011   MCV 76.0* 11/28/2011   PLT 234 11/28/2011      Chemistry      Component Value Date/Time   NA 143 11/28/2011 1005   K 4.0 11/28/2011 1005   CL 105 11/28/2011 1005   CO2 32 11/28/2011 1005   BUN 15 11/28/2011 1005   CREATININE 0.63 11/28/2011 1005      Component Value Date/Time   CALCIUM 9.6 11/28/2011 1005   ALKPHOS 55 11/28/2011 1005   AST 19 11/28/2011 1005   ALT 11 11/28/2011 1005   BILITOT 0.4 11/28/2011 1005       Lab Results  Component Value Date   LABCA2 22 11/28/2011     STUDIES:  No recent studies found.  ASSESSMENT: 46 year old BRCA 1-2 negative [place] woman   (1) Status post left lumpectomy and sentinel  lymph node biopsy October of 2012 for a T2 N1 or stage IIb invasive ductal carcinoma, grade 2,  strongly estrogen and progesterone receptor positive, with an MIB-1 of 9% and no HER-2/neu amplification.   (2) Treated initially with docetaxel, doxorubicin, and cyclophosphamide for one cycle, with very poor tolerance   (3) status post cyclophosphamide/docetaxel, for an additional three cycles completed January 2013  (4)  Status post radiation, completed mid-April 2013  (5)  on tamoxifen as of the completion of radiation  PLAN:  she is doing well with the tamoxifen so far, but understands it takes about 4 months to achieve a stable level, so she might get develop some other side effects. She is eager to have her port removed and I have sent [name] of a node alerting him regarding that. Tibbetts is aware that salon she is on tamoxifen it is not a problem to use local estrogen preparation for vaginal dryness. She understands also that tamoxifen is not a form of contraception and that her periods may return at any point.  The plan is to continue tamoxifen at least 2 years. At that time we can decide whether she would like to continue tamoxifen for a longer period or switch to an aromatase inhibitor. She knows to call for any problems that may develop before the next visit     Ardis Fullwood C    12/04/2011

## 2011-12-05 ENCOUNTER — Ambulatory Visit
Admission: RE | Admit: 2011-12-05 | Discharge: 2011-12-05 | Disposition: A | Discharge status: Home / Self Care | Payer: BC Managed Care – PPO | Source: Ambulatory Visit | Attending: Radiation Oncology | Admitting: Radiation Oncology

## 2011-12-05 ENCOUNTER — Encounter (HOSPITAL_COMMUNITY): Payer: Self-pay | Admitting: Pharmacy Technician

## 2011-12-05 ENCOUNTER — Encounter: Payer: Self-pay | Admitting: Radiation Oncology

## 2011-12-05 ENCOUNTER — Telehealth: Payer: Self-pay | Admitting: *Deleted

## 2011-12-05 VITALS — BP 111/70 | HR 69 | Temp 97.0°F | Resp 20 | Wt 153.3 lb

## 2011-12-05 DIAGNOSIS — C50919 Malignant neoplasm of unspecified site of unspecified female breast: Secondary | ICD-10-CM

## 2011-12-05 NOTE — Telephone Encounter (Signed)
left voice message to inform the patient of the new date and time 03-05-2012 starting at 10:00am

## 2011-12-05 NOTE — Progress Notes (Signed)
Pt denies pain, still has lingering fatigue, states skin in tx area still dry. On Tamoxifen.

## 2011-12-05 NOTE — Progress Notes (Signed)
Followup note:  The patient returns today approximately 1 month following completion of radiation therapy in the management of her stage IIB (T2, N1, M0) invasive ductal/DCIS of the left breast. She is without complaints today. She is now on adjuvant tamoxifen through Dr. Darnelle Catalan. She is waiting to get her Port-A-Cath taken out by [name] in the near future. Dr. Darnelle Catalan will be sending her to [facility] in September for bilateral mammography.  The physical examination: Alert and oriented. She is in good spirits. Vital signs: Wt Readings from Last 3 Encounters:  12/05/11 153 lb 4.8 oz (69.536 kg)  12/04/11 153 lb 12.8 oz (69.763 kg)  11/02/11 155 lb 8 oz (70.534 kg)   Temp Readings from Last 3 Encounters:  12/05/11 97 F (36.1 C) Oral  12/04/11 98 F (36.7 C) Oral  11/01/11 97.9 F (36.6 C)    BP Readings from Last 3 Encounters:  12/05/11 111/70  12/04/11 119/78  11/01/11 102/67   Pulse Readings from Last 3 Encounters:  12/05/11 69  12/04/11 87  11/01/11 76   Head and neck examination: She has regrowth of hair. Nodes: Without palpable cervical, supraclavicular, or axillary lymphadenopathy. Chest: Lungs clear. Breasts: There is residual hyperpigmentation the skin along the left breast with patchy dry desquamation. There is slight thickening of the left breast, no masses are appreciated. Right breast without masses or lesions. Abdomen without hepatomegaly. Extremities: Without edema.  Impression: Satisfactory progress.  Plan: She'll maintain her followup through Dr. Darnelle Catalan. Dr. Darnelle Catalan will get her scheduled for bilateral mammography at [facility] in September which she typically has annual mammography.

## 2011-12-06 ENCOUNTER — Other Ambulatory Visit (INDEPENDENT_AMBULATORY_CARE_PROVIDER_SITE_OTHER): Payer: Self-pay | Admitting: General Surgery

## 2011-12-06 ENCOUNTER — Telehealth (INDEPENDENT_AMBULATORY_CARE_PROVIDER_SITE_OTHER): Payer: Self-pay

## 2011-12-06 ENCOUNTER — Encounter (HOSPITAL_COMMUNITY): Payer: Self-pay | Admitting: *Deleted

## 2011-12-06 NOTE — Telephone Encounter (Signed)
They need orders put in for surgery on 5/21

## 2011-12-12 ENCOUNTER — Encounter (HOSPITAL_COMMUNITY): Payer: Self-pay | Admitting: Certified Registered Nurse Anesthetist

## 2011-12-12 ENCOUNTER — Encounter (HOSPITAL_COMMUNITY): Payer: Self-pay | Admitting: *Deleted

## 2011-12-12 ENCOUNTER — Ambulatory Visit (HOSPITAL_COMMUNITY)
Admission: RE | Admit: 2011-12-12 | Discharge: 2011-12-12 | Disposition: A | Discharge status: Home / Self Care | Payer: BC Managed Care – PPO | Source: Ambulatory Visit | Attending: General Surgery | Admitting: General Surgery

## 2011-12-12 ENCOUNTER — Encounter (HOSPITAL_COMMUNITY)
Admission: RE | Disposition: A | Discharge status: Home / Self Care | Payer: Self-pay | Source: Ambulatory Visit | Attending: General Surgery

## 2011-12-12 ENCOUNTER — Ambulatory Visit (HOSPITAL_COMMUNITY): Payer: BC Managed Care – PPO | Admitting: Certified Registered Nurse Anesthetist

## 2011-12-12 DIAGNOSIS — C50919 Malignant neoplasm of unspecified site of unspecified female breast: Secondary | ICD-10-CM | POA: Insufficient documentation

## 2011-12-12 DIAGNOSIS — Z452 Encounter for adjustment and management of vascular access device: Secondary | ICD-10-CM

## 2011-12-12 HISTORY — PX: PORT-A-CATH REMOVAL: SHX5289

## 2011-12-12 LAB — SURGICAL PCR SCREEN: Staphylococcus aureus: NEGATIVE

## 2011-12-12 SURGERY — REMOVAL PORT-A-CATH
Anesthesia: Monitor Anesthesia Care | Site: Chest | Laterality: Right | Wound class: Clean

## 2011-12-12 MED ORDER — MUPIROCIN 2 % EX OINT
TOPICAL_OINTMENT | CUTANEOUS | Status: AC
  Filled 2011-12-12: qty 22

## 2011-12-12 MED ORDER — LACTATED RINGERS IV SOLN
INTRAVENOUS | Status: DC | PRN
  Administered 2011-12-12: 07:00:00 via INTRAVENOUS

## 2011-12-12 MED ORDER — HYDROMORPHONE HCL PF 1 MG/ML IJ SOLN: 0.2500 mg | INTRAMUSCULAR | Status: DC | PRN

## 2011-12-12 MED ORDER — PROPOFOL 10 MG/ML IV EMUL
INTRAVENOUS | Status: DC | PRN
  Administered 2011-12-12: 100 ug/kg/min via INTRAVENOUS

## 2011-12-12 MED ORDER — FENTANYL CITRATE 0.05 MG/ML IJ SOLN
INTRAMUSCULAR | Status: DC | PRN
  Administered 2011-12-12: 50 ug via INTRAVENOUS

## 2011-12-12 MED ORDER — BUPIVACAINE-EPINEPHRINE PF 0.25-1:200000 % IJ SOLN
INTRAMUSCULAR | Status: DC | PRN
  Administered 2011-12-12: 5 mL

## 2011-12-12 MED ORDER — BUPIVACAINE-EPINEPHRINE PF 0.25-1:200000 % IJ SOLN
INTRAMUSCULAR | Status: AC
  Filled 2011-12-12: qty 30

## 2011-12-12 MED ORDER — 0.9 % SODIUM CHLORIDE (POUR BTL) OPTIME
TOPICAL | Status: DC | PRN
  Administered 2011-12-12: 1000 mL

## 2011-12-12 MED ORDER — MIDAZOLAM HCL 5 MG/5ML IJ SOLN
INTRAMUSCULAR | Status: DC | PRN
  Administered 2011-12-12: 2 mg via INTRAVENOUS

## 2011-12-12 SURGICAL SUPPLY — 32 items
ADH SKN CLS APL DERMABOND .7 (GAUZE/BANDAGES/DRESSINGS) ×1
APL SKNCLS STERI-STRIP NONHPOA (GAUZE/BANDAGES/DRESSINGS)
BENZOIN TINCTURE PRP APPL 2/3 (GAUZE/BANDAGES/DRESSINGS) IMPLANT
BLADE HEX COATED 2.75 (ELECTRODE) ×2 IMPLANT
BLADE SURG 15 STRL LF DISP TIS (BLADE) ×1 IMPLANT
BLADE SURG 15 STRL SS (BLADE) ×2
CLOTH BEACON ORANGE TIMEOUT ST (SAFETY) ×2 IMPLANT
COVER SURGICAL LIGHT HANDLE (MISCELLANEOUS) ×1 IMPLANT
DECANTER SPIKE VIAL GLASS SM (MISCELLANEOUS) ×1 IMPLANT
DERMABOND ADVANCED (GAUZE/BANDAGES/DRESSINGS) ×1
DERMABOND ADVANCED .7 DNX12 (GAUZE/BANDAGES/DRESSINGS) IMPLANT
DRAPE LAPAROTOMY TRNSV 102X78 (DRAPE) ×2 IMPLANT
DRSG TEGADERM 4X4.75 (GAUZE/BANDAGES/DRESSINGS) IMPLANT
ELECT REM PT RETURN 9FT ADLT (ELECTROSURGICAL) ×2
ELECTRODE REM PT RTRN 9FT ADLT (ELECTROSURGICAL) ×1 IMPLANT
GAUZE SPONGE 4X4 16PLY XRAY LF (GAUZE/BANDAGES/DRESSINGS) ×2 IMPLANT
GLOVE BIOGEL PI IND STRL 7.0 (GLOVE) ×1 IMPLANT
GLOVE BIOGEL PI INDICATOR 7.0 (GLOVE) ×1
GOWN STRL NON-REIN LRG LVL3 (GOWN DISPOSABLE) ×1 IMPLANT
GOWN STRL REIN XL XLG (GOWN DISPOSABLE) ×2 IMPLANT
KIT BASIN OR (CUSTOM PROCEDURE TRAY) ×2 IMPLANT
NDL HYPO 25X1 1.5 SAFETY (NEEDLE) ×1 IMPLANT
NEEDLE HYPO 22GX1.5 SAFETY (NEEDLE) ×2 IMPLANT
NEEDLE HYPO 25X1 1.5 SAFETY (NEEDLE) ×2 IMPLANT
NS IRRIG 1000ML POUR BTL (IV SOLUTION) ×2 IMPLANT
PACK BASIC VI WITH GOWN DISP (CUSTOM PROCEDURE TRAY) ×2 IMPLANT
PENCIL BUTTON HOLSTER BLD 10FT (ELECTRODE) ×2 IMPLANT
SPONGE GAUZE 4X4 12PLY (GAUZE/BANDAGES/DRESSINGS) ×1 IMPLANT
STRIP CLOSURE SKIN 1/2X4 (GAUZE/BANDAGES/DRESSINGS) IMPLANT
SUT MNCRL AB 4-0 PS2 18 (SUTURE) ×2 IMPLANT
SYR CONTROL 10ML LL (SYRINGE) ×2 IMPLANT
TOWEL OR 17X26 10 PK STRL BLUE (TOWEL DISPOSABLE) ×2 IMPLANT

## 2011-12-12 NOTE — Transfer of Care (Signed)
Immediate Anesthesia Transfer of Care Note  Patient: Erin Castillo  Procedure(s) Performed: Procedure(s) (LRB): REMOVAL PORT-A-CATH (Right)  Patient Location: PACU  Anesthesia Type: MAC  Level of Consciousness: awake, alert , oriented and patient cooperative  Airway & Oxygen Therapy: Patient Spontanous Breathing and Patient connected to face mask oxygen  Post-op Assessment: Report given to PACU RN, Post -op Vital signs reviewed and stable, Patient moving all extremities and Patient moving all extremities X 4  Post vital signs: Reviewed and stable  Complications: No apparent anesthesia complications

## 2011-12-12 NOTE — Anesthesia Preprocedure Evaluation (Signed)
Anesthesia Evaluation  Patient identified by MRN, date of birth, ID band Patient awake    Reviewed: Allergy & Precautions, H&P , NPO status , Patient's Chart, lab work & pertinent test results, reviewed documented beta blocker date and time   Airway Mallampati: II TM Distance: >3 FB Neck ROM: Full    Dental  (+) Teeth Intact and Dental Advisory Given   Pulmonary neg pulmonary ROS,  breath sounds clear to auscultation        Cardiovascular Rhythm:Regular Rate:Normal  Denies cardiac symptoms   Neuro/Psych negative neurological ROS  negative psych ROS   GI/Hepatic negative GI ROS, Neg liver ROS,   Endo/Other  negative endocrine ROS  Renal/GU negative Renal ROS  negative genitourinary   Musculoskeletal negative musculoskeletal ROS (+)   Abdominal   Peds negative pediatric ROS (+)  Hematology negative hematology ROS (+)   Anesthesia Other Findings   Reproductive/Obstetrics Hx breast cancer                           Anesthesia Physical Anesthesia Plan  ASA: II  Anesthesia Plan: MAC   Post-op Pain Management:    Induction: Intravenous  Airway Management Planned: Mask  Additional Equipment:   Intra-op Plan:   Post-operative Plan:   Informed Consent: I have reviewed the patients History and Physical, chart, labs and discussed the procedure including the risks, benefits and alternatives for the proposed anesthesia with the patient or authorized representative who has indicated his/her understanding and acceptance.   Dental advisory given  Plan Discussed with: CRNA and Surgeon  Anesthesia Plan Comments:         Anesthesia Quick Evaluation

## 2011-12-12 NOTE — Op Note (Signed)
Pre op diagnosis: status post Port-A-Cath  Postop diagnosis: Same  Surgical procedure: Removal of Port-A-Cath  Surgeon: Glenna Fellows  Anesthesia: Local  Description of procedure: The patient is positioned supine and the anterior chest sterilely prepped and draped.Patient and procedure were verified. Local anesthesia was used to infiltrate the skin and underlying soft tissue. The previous incision was opened and sharp dissection carried down onto the port and catheter. The catheter was withdrawn intact. The port was sharply dissected out of the subcutaneous tissue and removed with all suture material. The subcutaneous tissue was closed with interrupted 4-0 Monocryl and the skin was closed with subcuticular 4-0 Monocryl and Dermabond. Patient tolerated the procedure well.   Mariella Saa MD, FACS  12/12/2011

## 2011-12-12 NOTE — H&P (Signed)
  Subjective:    Erin Castillo is a 46 y.o. female who presents for portacath removal folllowing completion of chemotherapy for breast cancer.    Objective:   BP 104/75  Pulse 72  Temp 97 F (36.1 C)  Resp 20  Ht 5\' 5"  (1.651 m)  Wt 153 lb 8 oz (69.627 kg)  BMI 25.54 kg/m2  SpO2 96%  General:  alert and cooperative Skin:  normal  Lungs:  clear to auscultation bilaterally, potacath on chest wall Heart:  regular rate and rhythm      Assessment:For portacath removal    Plan:  Portacath removal  Mariella Saa MD, FACS  12/12/2011, 7:23 AM

## 2011-12-12 NOTE — Discharge Instructions (Signed)
Call as needed for concerns about incision May shower tomorrow, glue will peel off in about two weeks.  No creams or oils No restrictions Return 3 months for breast cancer followup

## 2011-12-12 NOTE — Progress Notes (Signed)
PAS hose discontinued

## 2011-12-12 NOTE — Anesthesia Postprocedure Evaluation (Signed)
  Anesthesia Post-op Note  Patient: Erin Castillo  Procedure(s) Performed: Procedure(s) (LRB): REMOVAL PORT-A-CATH (Right)  Patient Location: PACU  Anesthesia Type: MAC  Level of Consciousness: oriented and sedated  Airway and Oxygen Therapy: Patient Spontanous Breathing  Post-op Pain: mild  Post-op Assessment: Post-op Vital signs reviewed, Patient's Cardiovascular Status Stable, Respiratory Function Stable and Patent Airway  Post-op Vital Signs: stable  Complications: No apparent anesthesia complications

## 2011-12-14 ENCOUNTER — Encounter (HOSPITAL_COMMUNITY): Payer: Self-pay | Admitting: General Surgery

## 2011-12-19 ENCOUNTER — Ambulatory Visit: Payer: BC Managed Care – PPO | Admitting: Oncology

## 2011-12-25 ENCOUNTER — Ambulatory Visit: Payer: BC Managed Care – PPO | Admitting: Oncology

## 2012-03-05 ENCOUNTER — Other Ambulatory Visit (HOSPITAL_BASED_OUTPATIENT_CLINIC_OR_DEPARTMENT_OTHER): Payer: BC Managed Care – PPO | Admitting: Lab

## 2012-03-05 ENCOUNTER — Other Ambulatory Visit: Payer: Self-pay | Admitting: Oncology

## 2012-03-05 ENCOUNTER — Telehealth: Payer: Self-pay | Admitting: Oncology

## 2012-03-05 ENCOUNTER — Other Ambulatory Visit: Payer: Self-pay | Admitting: *Deleted

## 2012-03-05 ENCOUNTER — Ambulatory Visit (HOSPITAL_BASED_OUTPATIENT_CLINIC_OR_DEPARTMENT_OTHER): Payer: BC Managed Care – PPO | Admitting: Oncology

## 2012-03-05 VITALS — BP 113/71 | HR 76 | Temp 98.5°F | Resp 20 | Ht 65.0 in | Wt 154.7 lb

## 2012-03-05 DIAGNOSIS — Z170 Estrogen receptor positive status [ER+]: Secondary | ICD-10-CM

## 2012-03-05 DIAGNOSIS — C50419 Malignant neoplasm of upper-outer quadrant of unspecified female breast: Secondary | ICD-10-CM

## 2012-03-05 DIAGNOSIS — C50919 Malignant neoplasm of unspecified site of unspecified female breast: Secondary | ICD-10-CM

## 2012-03-05 LAB — COMPREHENSIVE METABOLIC PANEL
ALT: 12 U/L (ref 0–35)
AST: 17 U/L (ref 0–37)
Alkaline Phosphatase: 68 U/L (ref 39–117)
Creatinine, Ser: 0.66 mg/dL (ref 0.50–1.10)
Sodium: 138 mEq/L (ref 135–145)
Total Bilirubin: 0.3 mg/dL (ref 0.3–1.2)
Total Protein: 6.9 g/dL (ref 6.0–8.3)

## 2012-03-05 LAB — CBC WITH DIFFERENTIAL/PLATELET
BASO%: 0.8 % (ref 0.0–2.0)
EOS%: 2.5 % (ref 0.0–7.0)
HCT: 39 % (ref 34.8–46.6)
LYMPH%: 22.3 % (ref 14.0–49.7)
MCH: 26.9 pg (ref 25.1–34.0)
MCHC: 32.5 g/dL (ref 31.5–36.0)
MONO#: 0.5 10*3/uL (ref 0.1–0.9)
NEUT%: 62 % (ref 38.4–76.8)
Platelets: 243 10*3/uL (ref 145–400)
RBC: 4.71 10*6/uL (ref 3.70–5.45)
WBC: 4.2 10*3/uL (ref 3.9–10.3)
lymph#: 0.9 10*3/uL (ref 0.9–3.3)

## 2012-03-05 NOTE — Telephone Encounter (Signed)
gve the pt her feb 2014 appt calendar °

## 2012-03-05 NOTE — Progress Notes (Signed)
ID: Erin Castillo   DOB: Nov 21, 1965  MR#: [id number]  WUJ#:[id number]  HISTORY OF PRESENT ILLNESS: The patient noticed a dimple in the lateral left breast and was able to palpate a mass. She waited a couple of months since shewas still premenopausal, but the mass did not resolve. She brought it to the attention of [name] and underwent bilateral diagnostic mammography and left ultrasonography at [facility] on 04/20/2011. Compression views showed a spiculated mass in the upper outer quadrant of the left breast measuring 2.1 cm. By ultrasound, this was a lobulated, irregular, and hypoechoic mass measuring 1.8 cm. No abnormal appearing lymph nodes in the axilla.  Biopsy was performed on 04/20/2011 [id number]) showing an invasive ductal carcinoma which on the pathology report was described as grade 2. The tumor was ER positive at 98%, PR positive at 100%, HER-2/neu negative with a CISH ratio of 1.42, and an MIB-1 of 9%.  The patient underwent bilateral breast MRI on 10-12, confirming a 2.3 cm irregularly marginated, enhancing mass, deep in the upper outer quadrant of the left breast. This was close to the underlying pectoralis major, but without definite extension into the muscle. No other suspicious areas in either breast, and no abnormal appearing lymph nodes were noted. An incidental 1.1 cm anterior liver cyst was noted.  The patient underwentt left lumpectomy and sentinel lymph node sampling on 05/15/2011 under [name]. 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 No evidence of lymphovascular invasion. One of one sentinel lymph nodes was involved.  An Oncotype DX had already been requested, since initially the patient appeared to be node negative (NCCN guidelines do not recommend this test in node-positive patients). This showed the patient to be on the low risk side. She is known to be BRCA 1 and 2 negative. Her subsequent history is  as detailed below.  INTERVAL HISTORY: Erin Castillo returns today for routine followup of her breast cancer. Since her last visit here she had her port removed a month and started tamoxifen, which she is tolerating well. She's been working all summer, getting to school ready, but also took a week off and went with her family to [address], which she greatly enjoyed.  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 Partially as a result she has experienced more hot flashes, including at night. She has pain in the lateral left breast, near the lateral aspect of her scar. She also has pain in the left arm when she AB duct sit above 90. She sleeps poorly partly because of the hot flashes issue. She has not yet exercising regularly. A detailed review of systems was otherwise stable.  PAST MEDICAL HISTORY: Past Medical History  Diagnosis Date  . Migraines   . Anxiety   . Depression   . Breast cancer 04/20/11    Left, ER/PR+  . History of radiation therapy     09/18/11 to 11/03/11, left breast    PAST SURGICAL HISTORY: Past Surgical History  Procedure Date  . Nasal sinus surgery 1991  . Cesarean section 2000  . Breast lumpectomy 05/15/11    left breast lumpectomy   . Sinus exploration     remote  . Mastectomy partial / lumpectomy w/ axillary lymphadenectomy 10/12    snbx  . Portacath placement 06/12/2011    Procedure: INSERTION PORT-A-CATH;  Surgeon: Mariella Saa, MD;  Location: [facility];  Service: General;  Laterality: Right;  Right Subclavian Vein  . Port-a-cath removal 12/12/2011    Procedure: REMOVAL PORT-A-CATH;  Surgeon: Mariella Saa, MD;  Location: [facility];  Service: General;  Laterality: Right;    FAMILY HISTORY Family History  Problem Relation Age of Onset  . Cancer Father     bladder cancer  . Diabetes Father   The patient's father is alive at age 38.  The patient's mother is  alive at age 62.  The patient's father did have bladder cancer but has done well with that.  She has 3 sisters, no brothers.  There is no history of breast or ovarian cancer in the family.  GYNECOLOGIC HISTORY: She had menarche at age 30. The patient is GX, P3. She understands that the fact that she did not deliver a child before the age of 82 does double her risk of breast cancer developing. She stopped having periods after her first chemotherapy, November of 2012.    SOCIAL HISTORY: She works as an Print production planner for [employer]. Her friend, [name], who actually heads the board for [employer], often accompanies Margie to her appointments. The patient is separated from her husband, [name], who works at [employer]. They are not officially divorced, however. She understands that currently unless she fills out a healthcare power of attorney, [name] would be her healthcare power of attorney if something happened and she was not able to tell us her medical wishes. Children are Erin Castillo, age 24, Erin Castillo, age 21, and Erin Castillo, age 40. The patient attends [employer].    ADVANCED DIRECTIVES:  HEALTH MAINTENANCE: History  Substance Use Topics  . Smoking status: Never Smoker   . Smokeless tobacco: Never Used  . Alcohol Use: 0.0 oz/week    2-3 Glasses of wine per week     occassionally     Colonoscopy:  PAP:  Bone density:  Lipid panel:  No Known Allergies  Current Outpatient Prescriptions  Medication Sig Dispense Refill  . b complex vitamins tablet Take 1 tablet by mouth daily.       . citalopram (CELEXA) 20 MG tablet Take 20 mg by mouth daily with breakfast.       . gabapentin (NEURONTIN) 300 MG capsule Take 300 mg by mouth 4 (four) times daily as needed. For leg pain      . Multiple Vitamin (MULITIVITAMIN WITH MINERALS) TABS Take 1 tablet by mouth daily.      . tamoxifen (NOLVADEX) 20 MG tablet Take 20 mg by mouth at bedtime.       . traMADol (ULTRAM) 50 MG  tablet Take 1 tablet (50 mg total) by mouth every 6 (six) hours as needed for pain.  60 tablet  0    OBJECTIVE: Middle-aged white woman in no acute distress Filed Vitals:   03/05/12 1021  BP: 113/71  Pulse: 76  Temp: 98.5 F (36.9 C)  Resp: 20     Body mass index is 25.74 kg/(m^2).    ECOG FS: 0  Sclerae unicteric Oropharynx clear No cervical or supraclavicular adenopathy Lungs no rales or rhonchi Heart regular rate and rhythm Abd benign MSK no focal spinal tenderness, no peripheral edema; limited ROM left upper extremity Neuro: nonfocal;  Breasts: The right breast is unremarkable. The left breast is status post lumpectomy and radiation. There is no evidence of local recurrence. The left axilla is benign.  LAB RESULTS: Lab Results  Component Value Date   WBC 4.2 03/05/2012  NEUTROABS 2.6 03/05/2012   HGB 12.7 03/05/2012   HCT 39.0 03/05/2012   MCV 82.9 03/05/2012   PLT 243 03/05/2012      Chemistry      Component Value Date/Time   NA 143 11/28/2011 1005   K 4.0 11/28/2011 1005   CL 105 11/28/2011 1005   CO2 32 11/28/2011 1005   BUN 15 11/28/2011 1005   CREATININE 0.63 11/28/2011 1005      Component Value Date/Time   CALCIUM 9.6 11/28/2011 1005   ALKPHOS 55 11/28/2011 1005   AST 19 11/28/2011 1005   ALT 11 11/28/2011 1005   BILITOT 0.4 11/28/2011 1005       Lab Results  Component Value Date   LABCA2 22 11/28/2011     STUDIES:  Mammography at [facility] 02/26/2012 was unremarkable   ASSESSMENT: 46 year old BRCA 1-2 negative [place] woman   (1) Status post left lumpectomy and sentinel lymph node biopsy October of 2012 for a 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   (2) Treated initially with docetaxel, doxorubicin, and cyclophosphamide for one cycle, with very poor tolerance   (3) status post cyclophosphamide/docetaxel, for an additional three cycles  completed January 2013  (4)  Status post radiation, completed mid-April 2013  (5)  on tamoxifen as of the completion of radiation  PLAN: She is tolerating the tamoxifen well, and the plan is to continue at least 2 years, possibly longer. She will taking gabapentin at bedtime which should help with the nighttime hot flashes. She is concerned that her hair is so curly. It should straighten out over the next several months so that perhaps a year from now it will be back to her baseline. I again encouraged her to start a regular exercise program and [facility] Otherwise she will be seeing [name] sometime in October. She will return to see [place] early next year. She knows to call for any problems that may develop before then.  Grigor Lipschutz C    03/05/2012

## 2012-03-06 ENCOUNTER — Telehealth: Payer: Self-pay | Admitting: Oncology

## 2012-03-06 NOTE — Telephone Encounter (Signed)
Faxed over the  referral over to nancy in the lymphedema clinic for her to schedule the pt for the appt

## 2012-03-27 ENCOUNTER — Ambulatory Visit: Payer: BC Managed Care – PPO | Attending: Oncology | Admitting: Physical Therapy

## 2012-03-27 DIAGNOSIS — M25519 Pain in unspecified shoulder: Secondary | ICD-10-CM | POA: Insufficient documentation

## 2012-03-27 DIAGNOSIS — M24519 Contracture, unspecified shoulder: Secondary | ICD-10-CM | POA: Insufficient documentation

## 2012-03-27 DIAGNOSIS — IMO0001 Reserved for inherently not codable concepts without codable children: Secondary | ICD-10-CM | POA: Insufficient documentation

## 2012-03-27 DIAGNOSIS — C50919 Malignant neoplasm of unspecified site of unspecified female breast: Secondary | ICD-10-CM | POA: Insufficient documentation

## 2012-04-03 ENCOUNTER — Ambulatory Visit: Payer: BC Managed Care – PPO | Admitting: Physical Therapy

## 2012-04-04 ENCOUNTER — Ambulatory Visit: Payer: BC Managed Care – PPO | Admitting: Physical Therapy

## 2012-04-09 ENCOUNTER — Encounter: Payer: BC Managed Care – PPO | Admitting: Physical Therapy

## 2012-04-10 ENCOUNTER — Encounter: Payer: BC Managed Care – PPO | Admitting: Physical Therapy

## 2012-04-12 ENCOUNTER — Encounter (INDEPENDENT_AMBULATORY_CARE_PROVIDER_SITE_OTHER): Payer: BC Managed Care – PPO | Admitting: General Surgery

## 2012-04-16 ENCOUNTER — Ambulatory Visit: Payer: BC Managed Care – PPO | Admitting: Physical Therapy

## 2012-04-18 ENCOUNTER — Encounter: Payer: BC Managed Care – PPO | Admitting: Physical Therapy

## 2012-04-22 ENCOUNTER — Other Ambulatory Visit: Payer: Self-pay | Admitting: Gastroenterology

## 2012-05-21 ENCOUNTER — Other Ambulatory Visit: Payer: Self-pay | Admitting: Obstetrics and Gynecology

## 2012-05-21 ENCOUNTER — Other Ambulatory Visit (HOSPITAL_COMMUNITY)
Admission: RE | Admit: 2012-05-21 | Discharge: 2012-05-21 | Disposition: A | Discharge status: Home / Self Care | Payer: BC Managed Care – PPO | Source: Ambulatory Visit | Attending: Obstetrics and Gynecology | Admitting: Obstetrics and Gynecology

## 2012-05-21 DIAGNOSIS — Z1151 Encounter for screening for human papillomavirus (HPV): Secondary | ICD-10-CM | POA: Insufficient documentation

## 2012-05-21 DIAGNOSIS — Z01419 Encounter for gynecological examination (general) (routine) without abnormal findings: Secondary | ICD-10-CM | POA: Insufficient documentation

## 2012-09-05 ENCOUNTER — Other Ambulatory Visit: Payer: Self-pay | Admitting: *Deleted

## 2012-09-05 DIAGNOSIS — C50419 Malignant neoplasm of upper-outer quadrant of unspecified female breast: Secondary | ICD-10-CM

## 2012-09-09 ENCOUNTER — Ambulatory Visit (HOSPITAL_BASED_OUTPATIENT_CLINIC_OR_DEPARTMENT_OTHER): Payer: BC Managed Care – PPO | Admitting: Physician Assistant

## 2012-09-09 ENCOUNTER — Encounter: Payer: Self-pay | Admitting: Physician Assistant

## 2012-09-09 ENCOUNTER — Other Ambulatory Visit (HOSPITAL_BASED_OUTPATIENT_CLINIC_OR_DEPARTMENT_OTHER): Payer: BC Managed Care – PPO | Admitting: Lab

## 2012-09-09 ENCOUNTER — Telehealth: Payer: Self-pay | Admitting: Oncology

## 2012-09-09 VITALS — BP 115/80 | HR 101 | Temp 98.1°F | Resp 20 | Ht 65.0 in | Wt 146.7 lb

## 2012-09-09 DIAGNOSIS — C50419 Malignant neoplasm of upper-outer quadrant of unspecified female breast: Secondary | ICD-10-CM

## 2012-09-09 DIAGNOSIS — Z170 Estrogen receptor positive status [ER+]: Secondary | ICD-10-CM

## 2012-09-09 DIAGNOSIS — M79609 Pain in unspecified limb: Secondary | ICD-10-CM

## 2012-09-09 DIAGNOSIS — Z853 Personal history of malignant neoplasm of breast: Secondary | ICD-10-CM

## 2012-09-09 DIAGNOSIS — M79621 Pain in right upper arm: Secondary | ICD-10-CM

## 2012-09-09 LAB — CBC WITH DIFFERENTIAL/PLATELET
BASO%: 0.6 % (ref 0.0–2.0)
EOS%: 2.6 % (ref 0.0–7.0)
HCT: 41.1 % (ref 34.8–46.6)
LYMPH%: 22.2 % (ref 14.0–49.7)
MCH: 28.9 pg (ref 25.1–34.0)
MCHC: 33.3 g/dL (ref 31.5–36.0)
MCV: 86.7 fL (ref 79.5–101.0)
MONO#: 0.5 10*3/uL (ref 0.1–0.9)
MONO%: 9.6 % (ref 0.0–14.0)
NEUT%: 65 % (ref 38.4–76.8)
Platelets: 238 10*3/uL (ref 145–400)
RBC: 4.74 10*6/uL (ref 3.70–5.45)

## 2012-09-09 LAB — COMPREHENSIVE METABOLIC PANEL (CC13)
ALT: 15 U/L (ref 0–55)
Alkaline Phosphatase: 75 U/L (ref 40–150)
CO2: 31 mEq/L — ABNORMAL HIGH (ref 22–29)
Creatinine: 0.7 mg/dL (ref 0.6–1.1)
Total Bilirubin: 0.3 mg/dL (ref 0.20–1.20)

## 2012-09-09 MED ORDER — VENLAFAXINE HCL 37.5 MG PO TABS: 37.5000 mg | ORAL_TABLET | Freq: Two times a day (BID) | ORAL | Status: DC

## 2012-09-09 NOTE — Telephone Encounter (Signed)
gv pt appt schedule for August and appts for rt breast US @ [facility] 2/18 @ 8am (next available) and annual mammo @ [facility] 02/26/13 @ 8:30am (already on schedule).

## 2012-09-09 NOTE — Progress Notes (Signed)
ID: Erin Castillo   DOB: Dec 17, 1965  MR#: [id number]  CSN#:[id number]  HISTORY OF PRESENT ILLNESS: The patient noticed a dimple in the lateral left breast and was able to palpate a mass. She waited a couple of months since shewas still premenopausal, but the mass did not resolve. She brought it to the attention of [name] and underwent bilateral diagnostic mammography and left ultrasonography at [facility] on 04/20/2011. Compression views showed a spiculated mass in the upper outer quadrant of the left breast measuring 2.1 cm. By ultrasound, this was a lobulated, irregular, and hypoechoic mass measuring 1.8 cm. No abnormal appearing lymph nodes in the axilla.  Biopsy was performed on 04/20/2011 [id number]) showing an invasive ductal carcinoma which on the pathology report was described as grade 2. The tumor was ER positive at 98%, PR positive at 100%, HER-2/neu negative with a CISH ratio of 1.42, and an MIB-1 of 9%.  The patient underwent bilateral breast MRI on 10-12, confirming a 2.3 cm irregularly marginated, enhancing mass, deep in the upper outer quadrant of the left breast. This was close to the underlying pectoralis major, but without definite extension into the muscle. No other suspicious areas in either breast, and no abnormal appearing lymph nodes were noted. An incidental 1.1 cm anterior liver cyst was noted.  The patient underwentt left lumpectomy and sentinel lymph node sampling on 05/15/2011 under [name]. 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 No evidence of lymphovascular invasion. One of one sentinel lymph nodes was involved.  An Oncotype DX had already been requested, since initially the patient appeared to be node negative (NCCN guidelines do not recommend this test in node-positive patients). This showed the patient to be on the low risk side. She is known to be BRCA 1 and 2 negative. Her subsequent history is  as detailed below.  INTERVAL HISTORY: Erin Castillo returns today for routine followup of her breast cancer. She continues on tamoxifen which she is tolerating well. She has occasional hot flashes, and has been taking venlafaxine at 75 mg daily. She is planning on stopping that medication as of today to see if in fact it is hoping to decrease the hot flashes (she took her last tablet yesterday) but was not aware she needed to taper off of the drug.   Erin Castillo is also concerned about some pain she has been having for the last several weeks in her right axilla. She denies any new or increased activity that could have caused this discomfort. She feels like it is "thick" when she feels under her arm.  Otherwise interval history is unremarkable. She continues to work full-time, and her children are all doing well.   REVIEW OF SYSTEMS: Erin Castillo denies any recent illnesses and has had no fevers or chills. She denies any skin changes or rashes. She's had no signs of abnormal bleeding. She's eating and drinking well with no significant nausea and no emesis. She denies change in bowel or bladder habits. She's had no cough, phlegm production, or shortness of breath and denies any chest pain or palpitations. No unusual headaches, change in vision, or dizziness. With the exception of the pain in the axilla as noted above, no new or unusual myalgias, arthralgias, or bony pain. Her peripheral neuropathy has completely resolved. She denies any peripheral swelling.  A detailed review of systems is otherwise noncontributory.    PAST MEDICAL HISTORY: Past Medical History  Diagnosis Date  . Migraines   .  Anxiety   . Depression   . Breast cancer 04/20/11    Left, ER/PR+  . History of radiation therapy     09/18/11 to 11/03/11, left breast    PAST SURGICAL HISTORY: Past Surgical History  Procedure Laterality Date  . Nasal sinus surgery  1991  . Cesarean section  2000  . Breast lumpectomy  05/15/11    left breast  lumpectomy   . Sinus exploration      remote  . Mastectomy partial / lumpectomy w/ axillary lymphadenectomy  10/12    snbx  . Portacath placement  06/12/2011    Procedure: INSERTION PORT-A-CATH;  Surgeon: Mariella Saa, MD;  Location: [facility];  Service: General;  Laterality: Right;  Right Subclavian Vein  . Port-a-cath removal  12/12/2011    Procedure: REMOVAL PORT-A-CATH;  Surgeon: Mariella Saa, MD;  Location: [facility];  Service: General;  Laterality: Right;    FAMILY HISTORY Family History  Problem Relation Age of Onset  . Cancer Father     bladder cancer  . Diabetes Father   The patient's father is alive at age 43.  The patient's mother is alive at age 42.  The patient's father did have bladder cancer but has done well with that.  She has 3 sisters, no brothers.  There is no history of breast or ovarian cancer in the family.  GYNECOLOGIC HISTORY: She had menarche at age 52. The patient is GX, P3. She understands that the fact that she did not deliver a child before the age of 37 does double her risk of breast cancer developing. She stopped having periods after her first chemotherapy, November of 2012.    SOCIAL HISTORY: She works as an Print production planner for [employer]. Her friend, Erin Castillo, who actually heads the board for [employer], often accompanies Erin Castillo to her appointments. The patient is separated from her husband, Erin Castillo, who works at [employer]. They are not officially divorced, however. She understands that currently unless she fills out a healthcare power of attorney, Erin Castillo would be her healthcare power of attorney if something happened and she was not able to tell us her medical wishes. Children are Erin Castillo, age 57, Erin Castillo, age 12, and Erin Castillo, age 80. The patient attends [employer].    ADVANCED DIRECTIVES:  HEALTH MAINTENANCE: History  Substance Use Topics  . Smoking status: Never Smoker   . Smokeless  tobacco: Never Used  . Alcohol Use: 0.0 oz/week    2-3 Glasses of wine per week     Comment: occassionally     Colonoscopy:  Sept 2013, Dr. Evette Cristal  PAP:  Bone density: Never  Lipid panel:  No Known Allergies  Current Outpatient Prescriptions  Medication Sig Dispense Refill  . b complex vitamins tablet Take 1 tablet by mouth daily.       . citalopram (CELEXA) 20 MG tablet Take 20 mg by mouth daily with breakfast.       . Multiple Vitamin (MULITIVITAMIN WITH MINERALS) TABS Take 1 tablet by mouth daily.      . tamoxifen (NOLVADEX) 20 MG tablet Take 20 mg by mouth at bedtime.       Marland Kitchen venlafaxine (EFFEXOR) 37.5 MG tablet Take 1 tablet (37.5 mg total) by mouth 2 (two) times daily.  60 tablet  1  . mesalamine (LIALDA) 1.2 G EC tablet Take 1,200 mg by mouth daily with breakfast.       No current facility-administered medications for this visit.  OBJECTIVE: Middle-aged white woman in no acute distress Filed Vitals:   09/09/12 1040  BP: 115/80  Pulse: 101  Temp: 98.1 F (36.7 C)  Resp: 20     Body mass index is 24.41 kg/(m^2).    ECOG FS: 0  Sclerae unicteric Oropharynx clear No cervical or supraclavicular adenopathy Lungs clear to auscultation with no rales or rhonchi Heart regular rate and rhythm Abdomen soft, nontender, positive bowel sounds MSK no focal spinal tenderness, no peripheral edema;  Neuro: nonfocal; well oriented with positive affect Breasts: The right breast is unremarkable. There is some tenderness to palpation in the upper portion of the right axilla, with some fullness/thickening noted, but no distinct adenopathy. The left breast is status post lumpectomy and radiation. There is no evidence of local recurrence. The left axilla is benign.  LAB RESULTS: Lab Results  Component Value Date   WBC 5.1 09/09/2012   NEUTROABS 3.3 09/09/2012   HGB 13.7 09/09/2012   HCT 41.1 09/09/2012   MCV 86.7 09/09/2012   PLT 238 09/09/2012      Chemistry      Component Value  Date/Time   NA 143 09/09/2012 1025   NA 138 03/05/2012 1005   K 4.1 09/09/2012 1025   K 4.2 03/05/2012 1005   CL 105 09/09/2012 1025   CL 102 03/05/2012 1005   CO2 31* 09/09/2012 1025   CO2 30 03/05/2012 1005   BUN 14.6 09/09/2012 1025   BUN 16 03/05/2012 1005   CREATININE 0.7 09/09/2012 1025   CREATININE 0.66 03/05/2012 1005      Component Value Date/Time   CALCIUM 9.7 09/09/2012 1025   CALCIUM 9.3 03/05/2012 1005   ALKPHOS 75 09/09/2012 1025   ALKPHOS 68 03/05/2012 1005   AST 17 09/09/2012 1025   AST 17 03/05/2012 1005   ALT 15 09/09/2012 1025   ALT 12 03/05/2012 1005   BILITOT 0.30 09/09/2012 1025   BILITOT 0.3 03/05/2012 1005       Lab Results  Component Value Date   LABCA2 22 11/28/2011     STUDIES:  Mammography at [facility] 02/26/2012 was unremarkable   ASSESSMENT: 47 year old BRCA 1-2 negative [place] woman   (1) Status post left lumpectomy and sentinel lymph node biopsy October of 2012 for a 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   (2) Treated initially with docetaxel, doxorubicin, and cyclophosphamide for one cycle, with very poor tolerance   (3) status post cyclophosphamide/docetaxel, for an additional three cycles completed January 2013  (4)  Status post radiation, completed mid-April 2013  (5)  on tamoxifen as of the completion of radiation  PLAN: Erin Castillo continues to tolerate the tamoxifen quite well and will continue at 20 mg daily. We spent quite a bit of time today discussing the venlafaxine, and she would still like to consider stopping that medicationto determine if in fact it is helping the hot flashes. I gave her a slow schedule to taper over the next 6 weeks and she was given this information in writing. If at any point she decides to go back on the medication, she will simply go back to 2 tablets daily and we will be glad to refill that for her as  necessary.  I am referring Erin Castillo back to [facility] for an ultrasound of the right axilla. I think this is going to be benign, but given the fact that the pain has  not improved at all, it makes sense to evaluate further.  Finally, Erin Castillo will have her next mammogram in August, after which she will be seen by Dr. Darnelle Catalan for labs and followup visit. We will see her every 6 months, alternating her visits with [name]. She voices understanding and agreement with this plan and will call with any changes or problems.   Erin Castillo    09/09/2012

## 2012-09-10 ENCOUNTER — Other Ambulatory Visit: Payer: Self-pay | Admitting: *Deleted

## 2012-09-10 DIAGNOSIS — C50419 Malignant neoplasm of upper-outer quadrant of unspecified female breast: Secondary | ICD-10-CM

## 2012-09-10 MED ORDER — VENLAFAXINE HCL ER 37.5 MG PO CP24: ORAL_CAPSULE | ORAL | Status: DC

## 2012-11-10 ENCOUNTER — Other Ambulatory Visit: Payer: Self-pay | Admitting: Physician Assistant

## 2012-11-10 DIAGNOSIS — C50419 Malignant neoplasm of upper-outer quadrant of unspecified female breast: Secondary | ICD-10-CM

## 2012-11-10 DIAGNOSIS — C50919 Malignant neoplasm of unspecified site of unspecified female breast: Secondary | ICD-10-CM

## 2013-03-03 ENCOUNTER — Other Ambulatory Visit (HOSPITAL_BASED_OUTPATIENT_CLINIC_OR_DEPARTMENT_OTHER): Payer: BC Managed Care – PPO

## 2013-03-03 DIAGNOSIS — C50419 Malignant neoplasm of upper-outer quadrant of unspecified female breast: Secondary | ICD-10-CM

## 2013-03-03 LAB — CBC WITH DIFFERENTIAL/PLATELET
BASO%: 0.4 % (ref 0.0–2.0)
Basophils Absolute: 0 10*3/uL (ref 0.0–0.1)
EOS%: 2.1 % (ref 0.0–7.0)
HCT: 42.6 % (ref 34.8–46.6)
HGB: 14.1 g/dL (ref 11.6–15.9)
MCH: 29.1 pg (ref 25.1–34.0)
MONO#: 0.4 10*3/uL (ref 0.1–0.9)
RDW: 14 % (ref 11.2–14.5)
WBC: 5.7 10*3/uL (ref 3.9–10.3)
lymph#: 1 10*3/uL (ref 0.9–3.3)

## 2013-03-03 LAB — COMPREHENSIVE METABOLIC PANEL (CC13)
AST: 17 U/L (ref 5–34)
Albumin: 3.8 g/dL (ref 3.5–5.0)
Alkaline Phosphatase: 72 U/L (ref 40–150)
Calcium: 9.5 mg/dL (ref 8.4–10.4)
Chloride: 106 mEq/L (ref 98–109)
Glucose: 77 mg/dl (ref 70–140)
Potassium: 3.9 mEq/L (ref 3.5–5.1)
Sodium: 143 mEq/L (ref 136–145)
Total Protein: 7.4 g/dL (ref 6.4–8.3)

## 2013-03-10 ENCOUNTER — Other Ambulatory Visit: Payer: Self-pay | Admitting: Emergency Medicine

## 2013-03-10 ENCOUNTER — Ambulatory Visit (HOSPITAL_BASED_OUTPATIENT_CLINIC_OR_DEPARTMENT_OTHER): Payer: BC Managed Care – PPO | Admitting: Oncology

## 2013-03-10 ENCOUNTER — Telehealth: Payer: Self-pay | Admitting: *Deleted

## 2013-03-10 VITALS — BP 115/80 | HR 81 | Temp 98.4°F | Resp 20 | Ht 65.0 in | Wt 151.9 lb

## 2013-03-10 DIAGNOSIS — C50419 Malignant neoplasm of upper-outer quadrant of unspecified female breast: Secondary | ICD-10-CM

## 2013-03-10 DIAGNOSIS — Z170 Estrogen receptor positive status [ER+]: Secondary | ICD-10-CM

## 2013-03-10 MED ORDER — TAMOXIFEN CITRATE 20 MG PO TABS: 20.0000 mg | ORAL_TABLET | Freq: Every day | ORAL | Status: DC

## 2013-03-10 NOTE — Telephone Encounter (Signed)
appts made and printed...td 

## 2013-03-10 NOTE — Progress Notes (Signed)
ID: Erin Castillo   DOB: September 21, 1965  MR#: [id number]  WUJ#:[id number]  PCP: Jan Willard [id number], EVELYN, MD SU:  OTHER MD: [name]   HISTORY OF PRESENT ILLNESS: The patient noticed a dimple in the lateral left breast and was able to palpate a mass. She waited a couple of months since shewas still premenopausal, but the mass did not resolve. She brought it to the attention of [name] and underwent bilateral diagnostic mammography and left ultrasonography at [facility] on 04/20/2011. Compression views showed a spiculated mass in the upper outer quadrant of the left breast measuring 2.1 cm. By ultrasound, this was a lobulated, irregular, and hypoechoic mass measuring 1.8 cm. No abnormal appearing lymph nodes in the axilla.  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 The tumor was ER positive at 98%, PR positive at 100%, HER-2/neu negative with a CISH ratio of 1.42, and an MIB-1 of 9%.  The patient underwent bilateral breast MRI on 10-12, confirming a 2.3 cm irregularly marginated, enhancing mass, deep in the upper outer quadrant of the left breast. This was close to the underlying pectoralis major, but without definite extension into the muscle. No other suspicious areas in either breast, and no abnormal appearing lymph nodes were noted. An incidental 1.1 cm anterior liver cyst was noted.  The patient underwentt left lumpectomy and sentinel lymph node sampling on 05/15/2011 under [name]. 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 No evidence of lymphovascular invasion. One of one sentinel lymph nodes was involved.  An Oncotype DX had already been requested, since initially the patient appeared to be node negative (NCCN guidelines do not recommend this test in node-positive patients). This showed the patient to be on the low risk  side. She is known to be BRCA 1 and 2 negative. Her subsequent history is as detailed below.  INTERVAL HISTORY: Erin Castillo returns today for followup of her breast cancer. The interval history generally is unremarkable except she tells me she carries a new diagnosis of ulcerative colitis followed by Dr. Evette Cristal. She believes she will be scheduled for yearly colonoscopies indefinitely. She has no symptoms at present from this new problem  REVIEW OF SYSTEMS: Erin Castillo has pain in her right breast, that comes and goes. She did mention it to the mammographer last week when she had her routine studies and they added an ultrasound just to make sure. Everything looked fine. Aside from that she describes herself is moderately fatigued, partly due to insomnia issues. She still having hot flashes, which are moderate. Otherwise a detailed review of systems today was entirely stable  PAST MEDICAL HISTORY: Past Medical History  Diagnosis Date  . Migraines   . Anxiety   . Depression   . Breast cancer 04/20/11    Left, ER/PR+  . History of radiation therapy     09/18/11 to 11/03/11, left breast    PAST SURGICAL HISTORY: Past Surgical History  Procedure Laterality Date  . Nasal sinus surgery  1991  . Cesarean section  2000  . Breast lumpectomy  05/15/11    left breast lumpectomy   . Sinus exploration      remote  . Mastectomy partial / lumpectomy w/ axillary lymphadenectomy  10/12    snbx  . Portacath placement  06/12/2011    Procedure: INSERTION PORT-A-CATH;  Surgeon: Mariella Saa, MD;  Location: [facility];  Service: General;  Laterality: Right;  Right Subclavian Vein  . Port-a-cath removal  12/12/2011    Procedure: REMOVAL PORT-A-CATH;  Surgeon: Mariella Saa, MD;  Location: [facility];  Service: General;  Laterality: Right;    FAMILY HISTORY Family History  Problem Relation Age of Onset  . Cancer Father     bladder cancer  . Diabetes Father   The patient's father is alive at  age 32.  The patient's mother is alive at age 41.  The patient's father did have bladder cancer but has done well with that.  She has 3 sisters, no brothers.  There is no history of breast or ovarian cancer in the family.  GYNECOLOGIC HISTORY: She had menarche at age 9. The patient is GX, P3. She understands that the fact that she did not deliver a child before the age of 16 does double her risk of breast cancer developing. She stopped having periods after her first chemotherapy, November of 2012.    SOCIAL HISTORY: She works as an Print production planner for [employer]. Her friend, [name], who actually heads the board for [employer], often accompanies Erin Castillo to her appointments. The patient is divorced. Her former husband, Erin Castillo, works at [employer]. Children are Erin Castillo, age 71, and twins Erin Castillo, age 58 and Erin Castillo, age 34. The patient attends [employer].    ADVANCED DIRECTIVES: Not in place, but the patient tells me when she feels the mouth she intends to name her mother, Erin Castillo, as her healthcare power of attorney  HEALTH MAINTENANCE: History  Substance Use Topics  . Smoking status: Never Smoker   . Smokeless tobacco: Never Used  . Alcohol Use: 0.0 oz/week    2-3 Glasses of wine per week     Comment: occassionally     Colonoscopy:  Sept 2013, Dr. Evette Cristal  PAP:  Bone density: Never  Lipid panel:  No Known Allergies  Current Outpatient Prescriptions  Medication Sig Dispense Refill  . b complex vitamins tablet Take 1 tablet by mouth daily.       . citalopram (CELEXA) 20 MG tablet Take 20 mg by mouth daily with breakfast.       . mesalamine (LIALDA) 1.2 G EC tablet Take 1,200 mg by mouth daily with breakfast.      . Multiple Vitamin (MULITIVITAMIN WITH MINERALS) TABS Take 1 tablet by mouth daily.      . tamoxifen (NOLVADEX) 20 MG tablet Take 20 mg by mouth at bedtime.       Marland Kitchen venlafaxine XR (EFFEXOR-XR) 37.5 MG 24 hr capsule TAKE 2 CAPSULES BY  MOUTH EVERY DAY  60 capsule  4   No current facility-administered medications for this visit.    OBJECTIVE: Middle-aged white woman who appears healthy Filed Vitals:   03/10/13 0913  BP: 115/80  Pulse: 81  Temp: 98.4 F (36.9 C)  Resp: 20     Body mass index is 25.28 kg/(m^2).    ECOG FS: 0  Sclerae unicteric, pupils equal round and reactive to light Oropharynx clear No cervical or supraclavicular adenopathy Lungs clear to auscultation with no rales or rhonchi Heart regular rate and rhythm Abdomen soft, nontender, positive bowel sounds MSK no focal spinal tenderness, no peripheral edema;  Neuro: nonfocal; well oriented, pleasant affect Breasts: Careful examination of the right breast reveals no masses, tenderness, erythema, or swelling. The left breast is status post lumpectomy and radiation. There is no evidence of local recurrence. The left axilla  is benign.  LAB RESULTS: Lab Results  Component Value Date   WBC 5.7 03/03/2013   NEUTROABS 4.1 03/03/2013   HGB 14.1 03/03/2013   HCT 42.6 03/03/2013   MCV 87.8 03/03/2013   PLT 238 03/03/2013      Chemistry      Component Value Date/Time   NA 143 03/03/2013 0913   NA 138 03/05/2012 1005   K 3.9 03/03/2013 0913   K 4.2 03/05/2012 1005   CL 105 09/09/2012 1025   CL 102 03/05/2012 1005   CO2 28 03/03/2013 0913   CO2 30 03/05/2012 1005   BUN 13.9 03/03/2013 0913   BUN 16 03/05/2012 1005   CREATININE 0.7 03/03/2013 0913   CREATININE 0.66 03/05/2012 1005      Component Value Date/Time   CALCIUM 9.5 03/03/2013 0913   CALCIUM 9.3 03/05/2012 1005   ALKPHOS 72 03/03/2013 0913   ALKPHOS 68 03/05/2012 1005   AST 17 03/03/2013 0913   AST 17 03/05/2012 1005   ALT 14 03/03/2013 0913   ALT 12 03/05/2012 1005   BILITOT 0.27 03/03/2013 0913   BILITOT 0.3 03/05/2012 1005       Lab Results  Component Value Date   LABCA2 22 11/28/2011     STUDIES:  Mammography and right breast ultrasonography at [facility] a week ago were  unremarkable   ASSESSMENT: 47 y.o. BRCA 1-2 negative [place] woman   (1) Status post left lumpectomy and sentinel lymph node biopsy October of 2012 for a 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   (2) Treated initially with docetaxel, doxorubicin, and cyclophosphamide for one cycle, with very poor tolerance   (3) status post cyclophosphamide/docetaxel, for an additional three cycles completed January 2013  (4)  Status post radiation, completed mid-April 2013  (5)  on tamoxifen as of April 2013  PLAN: Jazlen is doing fine from a breast cancer point of view and there is no evidence of disease recurrence at this point. The detailed plan is to continue tamoxifen for 10 years. She sees her gynecologist in September on her routine basis, and we are going to start seeing her in March on a yearly basis beginning 2015.   I don't think the pain in the right breast needs further evaluation. Exam, mammography and ultrasonography are all very reassuring. She understands pain in the breast is called mastalgia and generally does not predict for breast cancer. Minka knows to call for any problems that may develop before her next visit here.   MAGRINAT,GUSTAV C    03/10/2013

## 2013-04-18 ENCOUNTER — Other Ambulatory Visit: Payer: Self-pay | Admitting: Physician Assistant

## 2013-05-29 ENCOUNTER — Other Ambulatory Visit: Payer: Self-pay

## 2013-06-26 ENCOUNTER — Telehealth: Payer: Self-pay | Admitting: *Deleted

## 2013-06-26 NOTE — Telephone Encounter (Signed)
Message left by pt stating she is calling post visit to GYN - " that is all ok but they wanted me to call your office to inform you of certain concerns "  Per message pt states concerns of weight gain, memory loss, " and I got lost on my way home from my son's practice last night", as well as eye doctor stated area of concern.  This RN returned call to pt and obtained identified VM- message left requesting call to this RN.

## 2013-06-27 ENCOUNTER — Telehealth: Payer: Self-pay | Admitting: *Deleted

## 2013-06-27 NOTE — Telephone Encounter (Signed)
This RN attempted to contact pt per her message yesterday for discussion of concerns.  Obtained identified VM- message left to return call to this RN.

## 2013-09-22 ENCOUNTER — Other Ambulatory Visit (HOSPITAL_BASED_OUTPATIENT_CLINIC_OR_DEPARTMENT_OTHER): Payer: BC Managed Care – PPO

## 2013-09-22 DIAGNOSIS — C50419 Malignant neoplasm of upper-outer quadrant of unspecified female breast: Secondary | ICD-10-CM

## 2013-09-22 LAB — CBC WITH DIFFERENTIAL/PLATELET
BASO%: 0.8 % (ref 0.0–2.0)
BASOS ABS: 0 10*3/uL (ref 0.0–0.1)
EOS ABS: 0.1 10*3/uL (ref 0.0–0.5)
EOS%: 2.5 % (ref 0.0–7.0)
HEMATOCRIT: 42.6 % (ref 34.8–46.6)
HEMOGLOBIN: 14.1 g/dL (ref 11.6–15.9)
LYMPH#: 0.9 10*3/uL (ref 0.9–3.3)
LYMPH%: 18.6 % (ref 14.0–49.7)
MCH: 28.8 pg (ref 25.1–34.0)
MCHC: 33.1 g/dL (ref 31.5–36.0)
MCV: 86.9 fL (ref 79.5–101.0)
MONO#: 0.5 10*3/uL (ref 0.1–0.9)
MONO%: 9.6 % (ref 0.0–14.0)
NEUT%: 68.5 % (ref 38.4–76.8)
NEUTROS ABS: 3.4 10*3/uL (ref 1.5–6.5)
PLATELETS: 218 10*3/uL (ref 145–400)
RBC: 4.9 10*6/uL (ref 3.70–5.45)
RDW: 13.7 % (ref 11.2–14.5)
WBC: 4.9 10*3/uL (ref 3.9–10.3)

## 2013-09-22 LAB — COMPREHENSIVE METABOLIC PANEL (CC13)
ALT: 13 U/L (ref 0–55)
ANION GAP: 9 meq/L (ref 3–11)
AST: 17 U/L (ref 5–34)
Albumin: 4.1 g/dL (ref 3.5–5.0)
Alkaline Phosphatase: 60 U/L (ref 40–150)
BILIRUBIN TOTAL: 0.37 mg/dL (ref 0.20–1.20)
BUN: 17.1 mg/dL (ref 7.0–26.0)
CALCIUM: 9.7 mg/dL (ref 8.4–10.4)
CHLORIDE: 104 meq/L (ref 98–109)
CO2: 31 meq/L — AB (ref 22–29)
CREATININE: 0.7 mg/dL (ref 0.6–1.1)
GLUCOSE: 89 mg/dL (ref 70–140)
Potassium: 3.9 mEq/L (ref 3.5–5.1)
Sodium: 143 mEq/L (ref 136–145)
Total Protein: 7 g/dL (ref 6.4–8.3)

## 2013-09-29 ENCOUNTER — Telehealth: Payer: Self-pay | Admitting: Oncology

## 2013-09-29 ENCOUNTER — Ambulatory Visit (HOSPITAL_BASED_OUTPATIENT_CLINIC_OR_DEPARTMENT_OTHER): Payer: BC Managed Care – PPO | Admitting: Oncology

## 2013-09-29 VITALS — BP 113/76 | HR 76 | Temp 98.3°F | Resp 20 | Ht 65.0 in | Wt 145.6 lb

## 2013-09-29 DIAGNOSIS — Z170 Estrogen receptor positive status [ER+]: Secondary | ICD-10-CM

## 2013-09-29 DIAGNOSIS — K5190 Ulcerative colitis, unspecified, without complications: Secondary | ICD-10-CM

## 2013-09-29 DIAGNOSIS — C50419 Malignant neoplasm of upper-outer quadrant of unspecified female breast: Secondary | ICD-10-CM

## 2013-09-29 NOTE — Telephone Encounter (Signed)
, °

## 2013-09-29 NOTE — Progress Notes (Signed)
ID: Erin Castillo   DOB: 19-Oct-1965  MR#: [id number]  UDJ#:[id number]  PCP: Jan Willard [id number], EVELYN, MD SU:  OTHER MD: Acquanetta Sit   BREAST CANCER HISTORY:  The patient noticed a dimple in the lateral left breast and was able to palpate a mass. She waited a couple of months since shewas still premenopausal, but the mass did not resolve. She brought it to the attention of Dr. Simona Huh and underwent bilateral diagnostic mammography and left ultrasonography at [facility] on 04/20/2011. Compression views showed a spiculated mass in the upper outer quadrant of the left breast measuring 2.1 cm. By ultrasound, this was a lobulated, irregular, and hypoechoic mass measuring 1.8 cm. No abnormal appearing lymph nodes in the axilla.  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 The tumor was ER positive at 98%, PR positive at 100%, HER-2/neu negative with a CISH ratio of 1.42, and an MIB-1 of 9%.  The patient underwent bilateral breast MRI on 10-12, confirming a 2.3 cm irregularly marginated, enhancing mass, deep in the upper outer quadrant of the left breast. This was close to the underlying pectoralis major, but without definite extension into the muscle. No other suspicious areas in either breast, and no abnormal appearing lymph nodes were noted. An incidental 1.1 cm anterior liver cyst was noted.  The patient underwentt left lumpectomy and sentinel lymph node sampling on 05/15/2011 under Dr. Excell Seltzer. 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 No evidence of lymphovascular invasion. One of one sentinel lymph nodes was involved.  An Oncotype DX had already been requested, since initially the patient appeared to be node negative (NCCN guidelines do not recommend this test in node-positive patients). This showed the patient to be on the low risk  side. She is known to be BRCA 1 and 2 negative. Her subsequent history is as detailed below.  INTERVAL HISTORY: Erin Castillo returns today for followup of her breast cancer. The interval history generally is unremarkable. She has been working hard at losing the weight she gained during her earlier treatments. She is walking everyday at least 30 minutes and she has changed her diet, cutting down on the carbohydrates and increasing the vegetables and protein. She's lost 15 pounds, feels and looks terrific.  REVIEW OF SYSTEMS: Zamoria is tolerating the tamoxifen without any significant side effects. The venlafaxine took care of the hot flashes. She has not had any further problems with mastalgia. She feels a little forgetful at times. A detailed review of systems was otherwise noncontributory  PAST MEDICAL HISTORY: Past Medical History  Diagnosis Date  . Migraines   . Anxiety   . Depression   . Breast cancer 04/20/11    Left, ER/PR+  . History of radiation therapy     09/18/11 to 11/03/11, left breast    PAST SURGICAL HISTORY: Past Surgical History  Procedure Laterality Date  . Nasal sinus surgery  1991  . Cesarean section  2000  . Breast lumpectomy  05/15/11    left breast lumpectomy   . Sinus exploration      remote  . Mastectomy partial / lumpectomy w/ axillary lymphadenectomy  10/12    snbx  . Portacath placement  06/12/2011    Procedure: INSERTION PORT-A-CATH;  Surgeon: Edward Jolly, MD;  Location: [facility];  Service: General;  Laterality: Right;  Right Subclavian Vein  . Port-a-cath removal  12/12/2011    Procedure: REMOVAL PORT-A-CATH;  Surgeon: Edward Jolly, MD;  Location: [facility];  Service: General;  Laterality: Right;    FAMILY HISTORY Family History  Problem Relation Age of Onset  . Cancer Father     bladder cancer  . Diabetes Father   The patient's father is alive at age 48.  The patient's mother is alive at age 43.  The patient's father did have  bladder cancer but has done well with that.  She has 3 sisters, no brothers.  There is no history of breast or ovarian cancer in the family.  GYNECOLOGIC HISTORY: She had menarche at age 68. The patient is GX, P3. She understands that the fact that she did not deliver a child before the age of 35 does double her risk of breast cancer developing. She stopped having periods after her first chemotherapy, November of 2012.    SOCIAL HISTORY: She works as an Glass blower/designer for [employer]. Her friend, Jac Canavan, who actually heads the board for [employer], often accompanies Erin Castillo to her appointments. The patient is divorced. Her former husband, Erin Castillo, works at [employer]. Children are Erin Castillo, age 41, and twins Erin Castillo, age 75 and Erin Castillo, age 23. The patient attends [employer].    ADVANCED DIRECTIVES: Not in place, but the patient tells me when she feels the mouth she intends to name her mother, Erin Castillo, as her healthcare power of attorney  HEALTH MAINTENANCE: History  Substance Use Topics  . Smoking status: Never Smoker   . Smokeless tobacco: Never Used  . Alcohol Use: 0.0 oz/week    2-3 Glasses of wine per week     Comment: occassionally     Colonoscopy:  Sept 2013, Dr. Penelope Coop  PAP:  Bone density: Never  Lipid panel:  No Known Allergies  Current Outpatient Prescriptions  Medication Sig Dispense Refill  . b complex vitamins tablet Take 1 tablet by mouth daily.       . citalopram (CELEXA) 20 MG tablet Take 20 mg by mouth daily with breakfast.       . mesalamine (LIALDA) 1.2 G EC tablet Take 1,200 mg by mouth daily with breakfast.      . Multiple Vitamin (MULITIVITAMIN WITH MINERALS) TABS Take 1 tablet by mouth daily.      . tamoxifen (NOLVADEX) 20 MG tablet Take 1 tablet (20 mg total) by mouth at bedtime.  90 tablet  4  . venlafaxine XR (EFFEXOR-XR) 37.5 MG 24 hr capsule TAKE 2 CAPSULES BY MOUTH EVERY DAY  60 capsule  4   No current  facility-administered medications for this visit.    OBJECTIVE: Middle-aged white woman in no acute distress Filed Vitals:   09/29/13 0902  BP: 113/76  Pulse: 76  Temp: 98.3 F (36.8 C)  Resp: 20     Body mass index is 24.23 kg/(m^2).    ECOG FS: 0  Sclerae unicteric, pupils round and equal Oropharynx clear and moist No cervical or supraclavicular adenopathy Lungs clear to auscultation with no rales or rhonchi Heart regular rate and rhythm Abdomen soft, nontender, positive bowel sounds MSK no focal spinal tenderness, no peripheral edema;  Neuro: nonfocal; well oriented, positive affect Breasts: The right breast is unremarkable The left breast is status post lumpectomy and radiation. There is no evidence of local recurrence. The left axilla is benign.  LAB RESULTS: Lab Results  Component Value Date   WBC 4.9 09/22/2013   NEUTROABS 3.4 09/22/2013  HGB 14.1 09/22/2013   HCT 42.6 09/22/2013   MCV 86.9 09/22/2013   PLT 218 09/22/2013      Chemistry      Component Value Date/Time   NA 143 09/22/2013 0820   NA 138 03/05/2012 1005   K 3.9 09/22/2013 0820   K 4.2 03/05/2012 1005   CL 105 09/09/2012 1025   CL 102 03/05/2012 1005   CO2 31* 09/22/2013 0820   CO2 30 03/05/2012 1005   BUN 17.1 09/22/2013 0820   BUN 16 03/05/2012 1005   CREATININE 0.7 09/22/2013 0820   CREATININE 0.66 03/05/2012 1005      Component Value Date/Time   CALCIUM 9.7 09/22/2013 0820   CALCIUM 9.3 03/05/2012 1005   ALKPHOS 60 09/22/2013 0820   ALKPHOS 68 03/05/2012 1005   AST 17 09/22/2013 0820   AST 17 03/05/2012 1005   ALT 13 09/22/2013 0820   ALT 12 03/05/2012 1005   BILITOT 0.37 09/22/2013 0820   BILITOT 0.3 03/05/2012 1005       Lab Results  Component Value Date   LABCA2 22 11/28/2011     STUDIES:  Mammography and right breast ultrasonography at [facility] August 2014 was benign   ASSESSMENT: 48 y.o. BRCA 1-2 negative [place] woman   (1) Status post left lumpectomy and sentinel lymph node biopsy October of 2012 for a  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   (2) Treated initially with docetaxel, doxorubicin, and cyclophosphamide for one cycle, with very poor tolerance   (3) status post cyclophosphamide/docetaxel, for an additional three cycles completed January 2013  (4)  Status post radiation, completed mid-April 2013  (5)  on tamoxifen as of April 2013  (6) ulcerative colitis-- followed By Dr Penelope Coop  PLAN: Jacie is doing "great" as far as her breast cancer is concerned. She is tolerating the tamoxifen well, and at this point the plan is to continue tamoxifen for 10 years. She is very interested in volunteering here and we will try to facilitate that. We will continue to see her on a every 6 month basis until she completes her first 5 years of followup.  She knows to call for any problems that may develop before the next visit here.  MAGRINAT,GUSTAV C    09/29/2013

## 2013-10-04 ENCOUNTER — Other Ambulatory Visit: Payer: Self-pay | Admitting: Oncology

## 2013-10-31 IMAGING — CR DG CHEST 1V PORT
1 series · 1 of 1 positions shown · non-contrast
Comparison: None.

CLINICAL DATA: Port-A-Cath placement.

PORTABLE CHEST - 1 VIEW

[view not recorded]
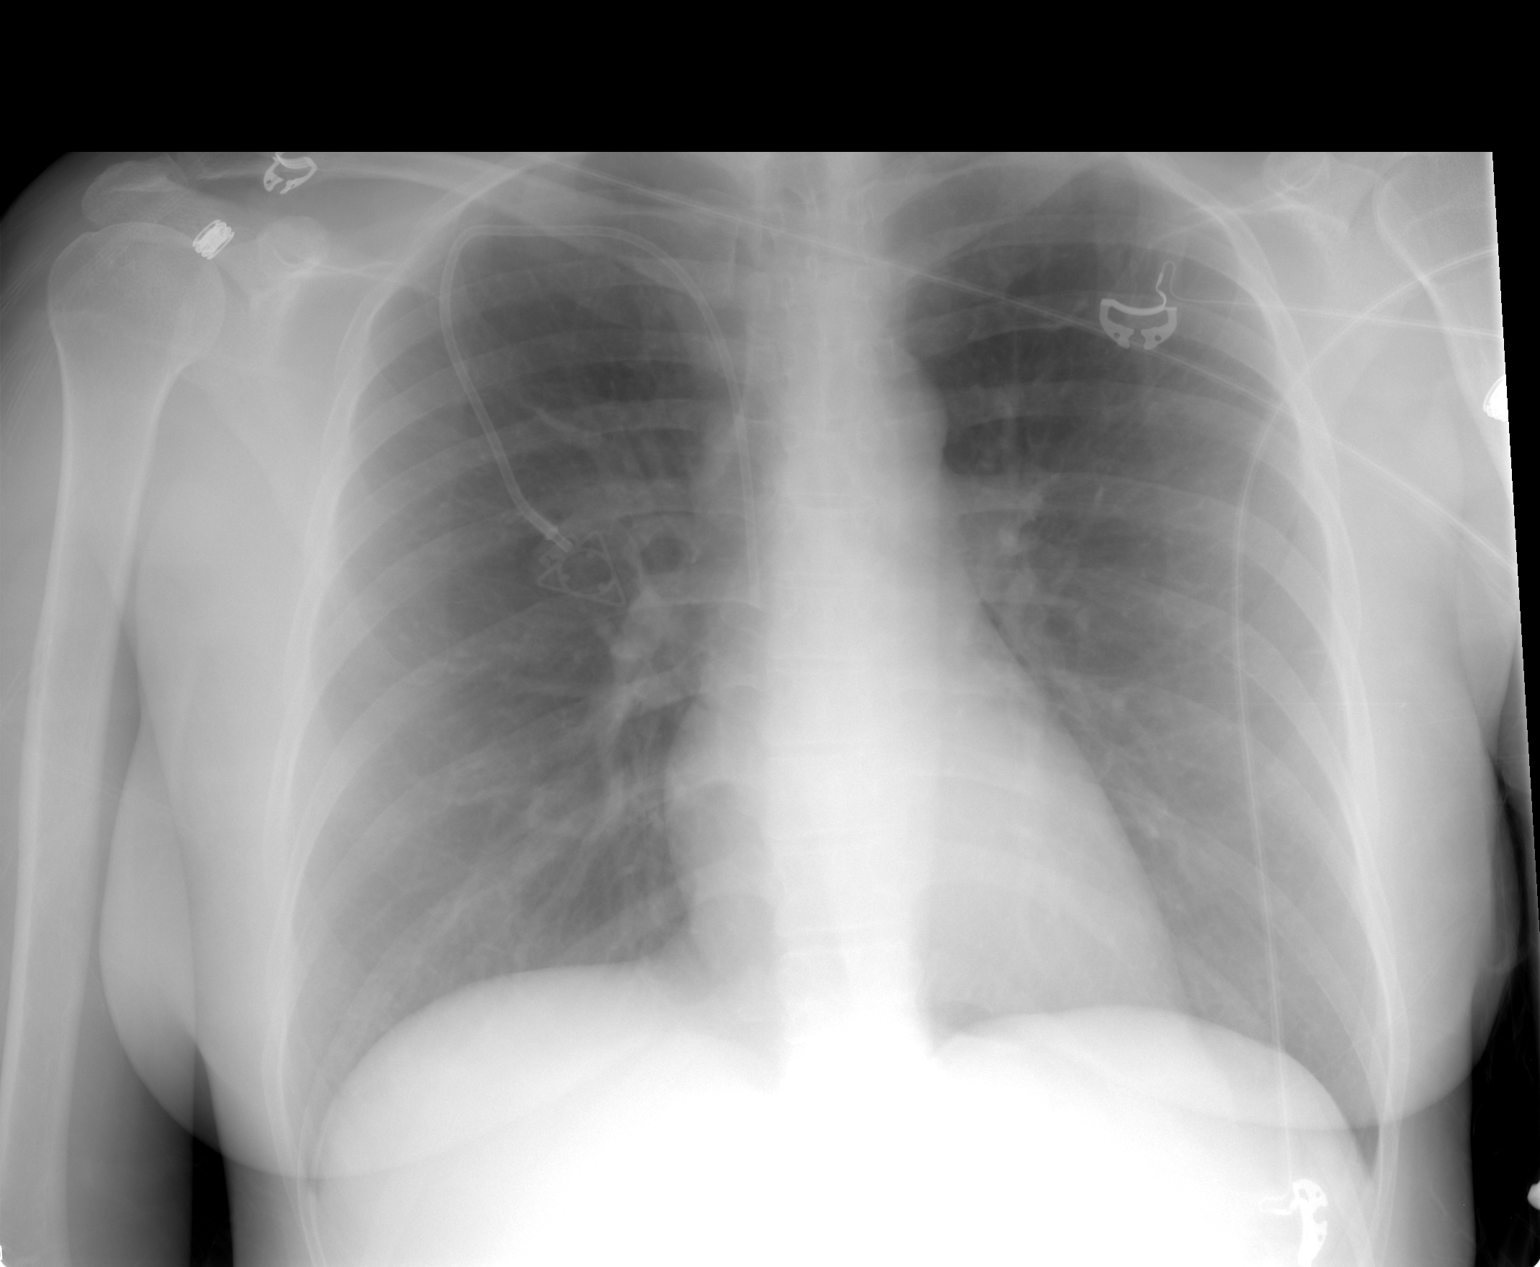

[1 of 1 positions shown; findings below may reference images not displayed]

FINDINGS: 6666 hours.  Right subclavian power port tip is near the
SVC right atrial junction.  There is no pneumothorax.  The lungs
are clear.  Heart size and mediastinal contours are normal.
IMPRESSION: Right subclavian power port placement without demonstrated
complication.

## 2014-02-11 ENCOUNTER — Other Ambulatory Visit: Payer: Self-pay | Admitting: Oncology

## 2014-04-07 ENCOUNTER — Other Ambulatory Visit (HOSPITAL_BASED_OUTPATIENT_CLINIC_OR_DEPARTMENT_OTHER): Payer: 59

## 2014-04-07 DIAGNOSIS — C50419 Malignant neoplasm of upper-outer quadrant of unspecified female breast: Secondary | ICD-10-CM

## 2014-04-07 LAB — COMPREHENSIVE METABOLIC PANEL (CC13)
ALT: 17 U/L (ref 0–55)
AST: 19 U/L (ref 5–34)
Albumin: 3.7 g/dL (ref 3.5–5.0)
Alkaline Phosphatase: 74 U/L (ref 40–150)
Anion Gap: 8 mEq/L (ref 3–11)
BUN: 22.1 mg/dL (ref 7.0–26.0)
CALCIUM: 9 mg/dL (ref 8.4–10.4)
CHLORIDE: 105 meq/L (ref 98–109)
CO2: 30 meq/L — AB (ref 22–29)
Creatinine: 0.7 mg/dL (ref 0.6–1.1)
Glucose: 78 mg/dl (ref 70–140)
Potassium: 4.2 mEq/L (ref 3.5–5.1)
Sodium: 143 mEq/L (ref 136–145)
Total Protein: 6.8 g/dL (ref 6.4–8.3)

## 2014-04-07 LAB — CBC WITH DIFFERENTIAL/PLATELET
BASO%: 0.7 % (ref 0.0–2.0)
Basophils Absolute: 0 10*3/uL (ref 0.0–0.1)
EOS%: 2.1 % (ref 0.0–7.0)
Eosinophils Absolute: 0.1 10*3/uL (ref 0.0–0.5)
HEMATOCRIT: 43.8 % (ref 34.8–46.6)
HGB: 14 g/dL (ref 11.6–15.9)
LYMPH#: 0.9 10*3/uL (ref 0.9–3.3)
LYMPH%: 18.1 % (ref 14.0–49.7)
MCH: 28.1 pg (ref 25.1–34.0)
MCHC: 32 g/dL (ref 31.5–36.0)
MCV: 87.9 fL (ref 79.5–101.0)
MONO#: 0.5 10*3/uL (ref 0.1–0.9)
MONO%: 9.7 % (ref 0.0–14.0)
NEUT#: 3.4 10*3/uL (ref 1.5–6.5)
NEUT%: 69.4 % (ref 38.4–76.8)
PLATELETS: 233 10*3/uL (ref 145–400)
RBC: 4.99 10*6/uL (ref 3.70–5.45)
RDW: 13.6 % (ref 11.2–14.5)
WBC: 4.9 10*3/uL (ref 3.9–10.3)

## 2014-04-14 ENCOUNTER — Ambulatory Visit (HOSPITAL_BASED_OUTPATIENT_CLINIC_OR_DEPARTMENT_OTHER): Payer: 59 | Admitting: Nurse Practitioner

## 2014-04-14 ENCOUNTER — Encounter: Payer: Self-pay | Admitting: Nurse Practitioner

## 2014-04-14 ENCOUNTER — Other Ambulatory Visit: Payer: Self-pay | Admitting: *Deleted

## 2014-04-14 ENCOUNTER — Telehealth: Payer: Self-pay | Admitting: Oncology

## 2014-04-14 VITALS — BP 110/74 | HR 79 | Temp 98.8°F | Resp 18 | Ht 65.0 in | Wt 147.0 lb

## 2014-04-14 DIAGNOSIS — R202 Paresthesia of skin: Secondary | ICD-10-CM

## 2014-04-14 DIAGNOSIS — C50412 Malignant neoplasm of upper-outer quadrant of left female breast: Secondary | ICD-10-CM

## 2014-04-14 DIAGNOSIS — R200 Anesthesia of skin: Secondary | ICD-10-CM | POA: Insufficient documentation

## 2014-04-14 DIAGNOSIS — C50419 Malignant neoplasm of upper-outer quadrant of unspecified female breast: Secondary | ICD-10-CM

## 2014-04-14 DIAGNOSIS — R209 Unspecified disturbances of skin sensation: Secondary | ICD-10-CM

## 2014-04-14 DIAGNOSIS — Z170 Estrogen receptor positive status [ER+]: Secondary | ICD-10-CM

## 2014-04-14 MED ORDER — TAMOXIFEN CITRATE 20 MG PO TABS: 20.0000 mg | ORAL_TABLET | Freq: Every day | ORAL | Status: DC

## 2014-04-14 NOTE — Telephone Encounter (Signed)
per pof to sch appt-cld lymphdema clinic per [facility] they will call pt for PT-gave pt copy of sch

## 2014-04-14 NOTE — Progress Notes (Signed)
ID: Erin Castillo   DOB: [birth year]  MR#: [id number]  ULA#:[id number]  PCP: Jan Willard OZY:YQMGNOI, EVELYN, MD SU:  OTHER MD: Acquanetta Sit  CHIEF COMPLAINT: left breast cancer CURRENT TREATMENT: tamoxifen 20mg  daily  BREAST CANCER HISTORY: The patient noticed a dimple in the lateral left breast and was able to palpate a mass. She waited a couple of months since shewas still premenopausal, but the mass did not resolve. She brought it to the attention of Dr. Simona Huh and underwent bilateral diagnostic mammography and left ultrasonography at [facility] on 04/20/2011. Compression views showed a spiculated mass in the upper outer quadrant of the left breast measuring 2.1 cm. By ultrasound, this was a lobulated, irregular, and hypoechoic mass measuring 1.8 cm. No abnormal appearing lymph nodes in the axilla.  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 The tumor was ER positive at 98%, PR positive at 100%, HER-2/neu negative with a CISH ratio of 1.42, and an MIB-1 of 9%.  The patient underwent bilateral breast MRI on 10-12, confirming a 2.3 cm irregularly marginated, enhancing mass, deep in the upper outer quadrant of the left breast. This was close to the underlying pectoralis major, but without definite extension into the muscle. No other suspicious areas in either breast, and no abnormal appearing lymph nodes were noted. An incidental 1.1 cm anterior liver cyst was noted.  The patient underwentt left lumpectomy and sentinel lymph node sampling on 05/15/2011 under Dr. Excell Seltzer. 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 No evidence of lymphovascular invasion. One of one sentinel lymph nodes was involved.  An Oncotype DX had already been requested, since initially the patient appeared to be node negative (NCCN guidelines do not recommend this test in  node-positive patients). This showed the patient to be on the low risk side. She is known to be BRCA 1 and 2 negative. Her subsequent history is as detailed below.  INTERVAL HISTORY: [name] returns today for follow up of her breast cancer. She has been on tamoxifen since April 2013 and is tolerating it well. Her only side effect was hot flashes, but this is managed well with venlafaxine. She does have a new complaint of left arm tingling, which she had felt early on in treatment, but has only reoccurred in the last 2 months or so.  REVIEW OF SYSTEMS: Ambri denies fevers, chills, nausea, vomiting, or changes in bowel or bladder habits. She has no shortness of breath, chest pain, palpitations, cough, or fatigue. A detailed review of systems was otherwise negative.   PAST MEDICAL HISTORY: Past Medical History  Diagnosis Date  . Migraines   . Anxiety   . Depression   . Breast cancer 04/20/11    Left, ER/PR+  . History of radiation therapy     09/18/11 to 11/03/11, left breast    PAST SURGICAL HISTORY: Past Surgical History  Procedure Laterality Date  . Nasal sinus surgery  1991  . Cesarean section  2000  . Breast lumpectomy  05/15/11    left breast lumpectomy   . Sinus exploration      remote  . Mastectomy partial / lumpectomy w/ axillary lymphadenectomy  10/12    snbx  . Portacath placement  06/12/2011    Procedure: INSERTION PORT-A-CATH;  Surgeon: Edward Jolly, MD;  Location: [place];  Service: General;  Laterality: Right;  Right Subclavian Vein  .  Port-a-cath removal  12/12/2011    Procedure: REMOVAL PORT-A-CATH;  Surgeon: Edward Jolly, MD;  Location: [facility];  Service: General;  Laterality: Right;    FAMILY HISTORY Family History  Problem Relation Age of Onset  . Cancer Father     bladder cancer  . Diabetes Father   The patient's father is alive at age 62.  The patient's mother is alive at age 46.  The patient's father did have bladder cancer but  has done well with that.  She has 3 sisters, no brothers.  There is no history of breast or ovarian cancer in the family.  GYNECOLOGIC HISTORY: She had menarche at age 21. The patient is GX, P3. She understands that the fact that she did not deliver a child before the age of 17 does double her risk of breast cancer developing. She stopped having periods after her first chemotherapy, November of 2012.    SOCIAL HISTORY: She works as an Glass blower/designer for [employer]. Her friend, Jac Canavan, who actually heads the board for [employer], often accompanies Shareena to her appointments. The patient is divorced. Her former husband, Seth Bake, works at [employer]. Children are Rodman Key, age 91, and twins Chrys Racer, age 98 and Remo Lipps, age 69. The patient attends [employer].    ADVANCED DIRECTIVES: Not in place, but the patient tells me when she feels the mouth she intends to name her mother, Silvestre Moment, as her healthcare power of attorney  HEALTH MAINTENANCE: History  Substance Use Topics  . Smoking status: Never Smoker   . Smokeless tobacco: Never Used  . Alcohol Use: 0.0 oz/week    2-3 Glasses of wine per week     Comment: occassionally     Colonoscopy:  Sept 2013, Dr. Penelope Coop  PAP:  Bone density: Never  Lipid panel:  No Known Allergies  Current Outpatient Prescriptions  Medication Sig Dispense Refill  . b complex vitamins tablet Take 1 tablet by mouth daily.       . citalopram (CELEXA) 20 MG tablet Take 20 mg by mouth daily with breakfast.       . mesalamine (LIALDA) 1.2 G EC tablet Take 1,200 mg by mouth daily with breakfast.      . Multiple Vitamin (MULITIVITAMIN WITH MINERALS) TABS Take 1 tablet by mouth daily.      Marland Kitchen venlafaxine XR (EFFEXOR-XR) 37.5 MG 24 hr capsule TAKE 2 CAPSULES BY MOUTH ONCE DAILY  60 capsule  4  . tamoxifen (NOLVADEX) 20 MG tablet Take 1 tablet (20 mg total) by mouth at bedtime.  90 tablet  4   No current facility-administered  medications for this visit.    OBJECTIVE: Middle-aged white woman in no acute distress Filed Vitals:   04/14/14 0853  BP: 110/74  Pulse: 79  Temp: 98.8 F (37.1 C)  Resp: 18     Body mass index is 24.46 kg/(m^2).    ECOG FS: 0  Skin: warm, dry  HEENT: sclerae anicteric, conjunctivae pink, oropharynx clear. No thrush or mucositis.  Lymph Nodes: No cervical or supraclavicular lymphadenopathy  Lungs: clear to auscultation bilaterally, no rales, wheezes, or rhonci  Heart: regular rate and rhythm  Abdomen: round, soft, non tender, positive bowel sounds  Musculoskeletal: No focal spinal tenderness, no peripheral edema  Neuro: non focal, well oriented, positive affect  Breasts: left breast status post lumpectomy and radiation. No evidence of local recurrence. Right breast unremarkable.    LAB RESULTS: Lab Results  Component Value  Date   WBC 4.9 04/07/2014   NEUTROABS 3.4 04/07/2014   HGB 14.0 04/07/2014   HCT 43.8 04/07/2014   MCV 87.9 04/07/2014   PLT 233 04/07/2014       Chemistry      Component Value Date/Time   NA 143 04/07/2014 0825   NA 138 03/05/2012 1005   K 4.2 04/07/2014 0825   K 4.2 03/05/2012 1005   CL 105 09/09/2012 1025   CL 102 03/05/2012 1005   CO2 30* 04/07/2014 0825   CO2 30 03/05/2012 1005   BUN 22.1 04/07/2014 0825   BUN 16 03/05/2012 1005   CREATININE 0.7 04/07/2014 0825   CREATININE 0.66 03/05/2012 1005      Component Value Date/Time   CALCIUM 9.0 04/07/2014 0825   CALCIUM 9.3 03/05/2012 1005   ALKPHOS 74 04/07/2014 0825   ALKPHOS 68 03/05/2012 1005   AST 19 04/07/2014 0825   AST 17 03/05/2012 1005   ALT 17 04/07/2014 0825   ALT 12 03/05/2012 1005   BILITOT <0.20 04/07/2014 0825   BILITOT 0.3 03/05/2012 1005       Lab Results  Component Value Date   LABCA2 22 11/28/2011     STUDIES: Most recent mammogram on 03/24/14 was unremarkable.  ASSESSMENT: 48 y.o. BRCA 1-2 negative [place] woman   (1) Status post left lumpectomy and sentinel lymph node biopsy  October of 2012 for a 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   (2) Treated initially with docetaxel, doxorubicin, and cyclophosphamide for one cycle, with very poor tolerance   (3) status post cyclophosphamide/docetaxel, for an additional three cycles completed January 2013  (4)  Status post radiation, completed mid-April 2013  (5)  on tamoxifen as of April 2013  (6) ulcerative colitis-- followed By Dr Penelope Coop  PLAN: Addeline is doing well as far as her breast cancer is concerned. There is no evidence of recurrent disease. The labs were reviewed in detail with her and were stable. She it tolerating the tamoxifen well with few complaints. The plan is to continue this drug for 10 years.   As for her left arm complaints, I am referring her to physical therapy for evaluation as it is on the same side that her lumpectomy and sentinel lymph node biopsy was performed. She had been referred in the past but did not attend her appointment because of work conflicts.   Kristian will return in 6 months for labs and evaluation. She understands and agrees with this plan. She has been encouraged to call with any issues that might arise before her next visit here.    Marcelino Duster    04/14/2014

## 2014-05-08 ENCOUNTER — Other Ambulatory Visit: Payer: Self-pay

## 2014-05-16 ENCOUNTER — Other Ambulatory Visit: Payer: Self-pay | Admitting: Nurse Practitioner

## 2014-05-27 ENCOUNTER — Other Ambulatory Visit: Payer: Self-pay | Admitting: *Deleted

## 2014-05-27 ENCOUNTER — Telehealth: Payer: Self-pay | Admitting: Physical Therapy

## 2014-05-27 NOTE — Telephone Encounter (Signed)
Called pt and left message to call us back to schedule an appt, Dr Magrinat's office

## 2014-06-09 ENCOUNTER — Encounter: Payer: 59 | Admitting: Physical Therapy

## 2014-06-10 ENCOUNTER — Ambulatory Visit: Payer: 59 | Attending: Physical Therapy | Admitting: Physical Therapy

## 2014-08-09 ENCOUNTER — Other Ambulatory Visit: Payer: Self-pay | Admitting: Oncology

## 2014-08-21 ENCOUNTER — Encounter (HOSPITAL_BASED_OUTPATIENT_CLINIC_OR_DEPARTMENT_OTHER): Payer: Self-pay

## 2014-08-21 ENCOUNTER — Emergency Department (HOSPITAL_BASED_OUTPATIENT_CLINIC_OR_DEPARTMENT_OTHER)
Admission: EM | Admit: 2014-08-21 | Discharge: 2014-08-21 | Disposition: A | Discharge status: Home / Self Care | Payer: 59 | Attending: Emergency Medicine | Admitting: Emergency Medicine

## 2014-08-21 DIAGNOSIS — R202 Paresthesia of skin: Secondary | ICD-10-CM

## 2014-08-21 DIAGNOSIS — R200 Anesthesia of skin: Secondary | ICD-10-CM | POA: Diagnosis not present

## 2014-08-21 DIAGNOSIS — Z853 Personal history of malignant neoplasm of breast: Secondary | ICD-10-CM | POA: Insufficient documentation

## 2014-08-21 DIAGNOSIS — Z79899 Other long term (current) drug therapy: Secondary | ICD-10-CM | POA: Diagnosis not present

## 2014-08-21 DIAGNOSIS — F419 Anxiety disorder, unspecified: Secondary | ICD-10-CM | POA: Insufficient documentation

## 2014-08-21 DIAGNOSIS — G43909 Migraine, unspecified, not intractable, without status migrainosus: Secondary | ICD-10-CM | POA: Diagnosis not present

## 2014-08-21 DIAGNOSIS — F329 Major depressive disorder, single episode, unspecified: Secondary | ICD-10-CM | POA: Insufficient documentation

## 2014-08-21 DIAGNOSIS — Z923 Personal history of irradiation: Secondary | ICD-10-CM | POA: Insufficient documentation

## 2014-08-21 DIAGNOSIS — M79601 Pain in right arm: Secondary | ICD-10-CM | POA: Diagnosis present

## 2014-08-21 NOTE — ED Notes (Signed)
Right arm tremor and feels "numb".  States she was walking across the gym and a basketball struck her in the chest.  States the arm pain started right after the injury.

## 2014-08-21 NOTE — Discharge Instructions (Signed)
You may take ibuprofen or tylenol for your numbness/tingling sensation. Return if symptoms worsen.  Paresthesia Paresthesia is an abnormal burning or prickling sensation. This sensation is generally felt in the hands, arms, legs, or feet. However, it may occur in any part of the body. It is usually not painful. The feeling may be described as:  Tingling or numbness.  "Pins and needles."  Skin crawling.  Buzzing.  Limbs "falling asleep."  Itching. Most people experience temporary (transient) paresthesia at some time in their lives. CAUSES  Paresthesia may occur when you breathe too quickly (hyperventilation). It can also occur without any apparent cause. Commonly, paresthesia occurs when pressure is placed on a nerve. The feeling quickly goes away once the pressure is removed. For some people, however, paresthesia is a long-lasting (chronic) condition caused by an underlying disorder. The underlying disorder may be:  A traumatic, direct injury to nerves. Examples include a:  Broken (fractured) neck.  Fractured skull.  A disorder affecting the brain and spinal cord (central nervous system). Examples include:  Transverse myelitis.  Encephalitis.  Transient ischemic attack.  Multiple sclerosis.  Stroke.  Tumor or blood vessel problems, such as an arteriovenous malformation pressing against the brain or spinal cord.  A condition that damages the peripheral nerves (peripheral neuropathy). Peripheral nerves are not part of the brain and spinal cord. These conditions include:  Diabetes.  Peripheral vascular disease.  Nerve entrapment syndromes, such as carpal tunnel syndrome.  Shingles.  Hypothyroidism.  Vitamin B12 deficiencies.  Alcoholism.  Heavy metal poisoning (lead, arsenic).  Rheumatoid arthritis.  Systemic lupus erythematosus. DIAGNOSIS  Your caregiver will attempt to find the underlying cause of your paresthesia. Your caregiver may:  Take your medical  history.  Perform a physical exam.  Order various lab tests.  Order imaging tests. TREATMENT  Treatment for paresthesia depends on the underlying cause. HOME CARE INSTRUCTIONS  Avoid drinking alcohol.  You may consider massage or acupuncture to help relieve your symptoms.  Keep all follow-up appointments as directed by your caregiver. SEEK IMMEDIATE MEDICAL CARE IF:   You feel weak.  You have trouble walking or moving.  You have problems with speech or vision.  You feel confused.  You cannot control your bladder or bowel movements.  You feel numbness after an injury.  You faint.  Your burning or prickling feeling gets worse when walking.  You have pain, cramps, or dizziness.  You develop a rash. MAKE SURE YOU:  Understand these instructions.  Will watch your condition.  Will get help right away if you are not doing well or get worse. Document Released: 06/30/2002 Document Revised: 10/02/2011 Document Reviewed: 03/31/2011 [facility] Patient Information 2015 [place], [place]. This information is not intended to replace advice given to you by your health care provider. Make sure you discuss any questions you have with your health care provider.

## 2014-08-21 NOTE — ED Provider Notes (Signed)
CSN: [id number]     Arrival date & time 08/21/14  1330 History   First MD Initiated Contact with Patient 08/21/14 1347     Chief Complaint  Patient presents with  . Arm Problem     (Consider location/radiation/quality/duration/timing/severity/associated sxs/prior Treatment) HPI Comments: 49 year old female presenting to the ED complaining of right arm numbness and tingling 1-1/2 hours. 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 She then "got the wind knocked out of her" and fell to the ground. No loss of consciousness. Initially she was feeling dizzy, however is starting to feel better. States her entire right arm was numb and shaking, however the shaking has started to subside and some feeling is coming back. Currently denies any chest pain, shortness of breath, nausea, vomiting, lightheadedness or dizziness. The principal that is present with her states that she appeared pale but is starting to get some color back into her.  The history is provided by the patient.    Past Medical History  Diagnosis Date  . Migraines   . Anxiety   . Depression   . Breast cancer 04/20/11    Left, ER/PR+  . History of radiation therapy     09/18/11 to 11/03/11, left breast   Past Surgical History  Procedure Laterality Date  . Nasal sinus surgery  1991  . Cesarean section  2000  . Breast lumpectomy  05/15/11    left breast lumpectomy   . Sinus exploration      remote  . Mastectomy partial / lumpectomy w/ axillary lymphadenectomy  10/12    snbx  . Portacath placement  06/12/2011    Procedure: INSERTION PORT-A-CATH;  Surgeon: Edward Jolly, MD;  Location: [place];  Service: General;  Laterality: Right;  Right Subclavian Vein  . Port-a-cath removal  12/12/2011    Procedure: REMOVAL PORT-A-CATH;  Surgeon: Edward Jolly, MD;  Location: [facility];  Service: General;  Laterality:  Right;   Family History  Problem Relation Age of Onset  . Cancer Father     bladder cancer  . Diabetes Father    History  Substance Use Topics  . Smoking status: Never Smoker   . Smokeless tobacco: Never Used  . Alcohol Use: 0.0 oz/week    2-3 Glasses of wine per week     Comment: occassionally   OB History    No data available     Review of Systems  10 Systems reviewed and are negative for acute change except as noted in the HPI.  Allergies  Review of patient's allergies indicates no known allergies.  Home Medications   Prior to Admission medications   Medication Sig Start Date End Date Taking? Authorizing Provider  b complex vitamins tablet Take 1 tablet by mouth daily.     Historical Provider, MD  citalopram (CELEXA) 20 MG tablet Take 20 mg by mouth daily with breakfast.     Historical Provider, MD  mesalamine (LIALDA) 1.2 G EC tablet Take 1,200 mg by mouth daily with breakfast.    Historical Provider, MD  Multiple Vitamin (MULITIVITAMIN WITH MINERALS) TABS Take 1 tablet by mouth daily.    Historical Provider, MD  tamoxifen (NOLVADEX) 20 MG tablet Take 1 tablet (20 mg total) by mouth at bedtime. 04/14/14   Marcelino Duster, NP  venlafaxine XR (EFFEXOR-XR) 37.5 MG 24 hr capsule TAKE 2 CAPSULES BY  MOUTH ONCE DAILY 08/09/14   Chauncey Cruel, MD   BP 111/80 mmHg  Pulse 78  Temp(Src) 98.8 F (37.1 C) (Oral)  Resp 16  Ht 5\' 5"  (1.651 m)  Wt 152 lb (68.947 kg)  BMI 25.29 kg/m2  SpO2 99% Physical Exam  Constitutional: She is oriented to person, place, and time. She appears well-developed and well-nourished. No distress.  HENT:  Head: Normocephalic and atraumatic.  Mouth/Throat: Oropharynx is clear and moist.  Eyes: Conjunctivae and EOM are normal. Pupils are equal, round, and reactive to light.  Neck: Normal range of motion. Neck supple. No JVD present.  Cardiovascular: Normal rate, regular rhythm, normal heart sounds and intact distal pulses.   Pulmonary/Chest:  Effort normal and breath sounds normal. No respiratory distress. She exhibits no tenderness.  Abdominal: Soft. Bowel sounds are normal. There is no tenderness.  Musculoskeletal: Normal range of motion. She exhibits no edema.  Neurological: She is alert and oriented to person, place, and time. She has normal strength. No sensory deficit.  Speech fluent, goal oriented. Moves limbs without ataxia. Equal grip strength bilateral.  Skin: Skin is warm and dry. She is not diaphoretic.  Psychiatric: She has a normal mood and affect. Her behavior is normal.  Nursing note and vitals reviewed.   ED Course  Procedures (including critical care time) Labs Review Labs Reviewed - No data to display  Imaging Review No results found.   EKG Interpretation   Date/Time:  Friday August 21 2014 13:57:47 EST Ventricular Rate:  77 PR Interval:  156 QRS Duration: 84 QT Interval:  390 QTC Calculation: 441 R Axis:   64 Text Interpretation:  Normal sinus rhythm Normal ECG No old tracing to  compare Confirmed by Debby Freiberg [phone]) on 08/21/2014 2:07:12 PM      MDM   Final diagnoses:  Numbness and tingling of right arm   Pt in NAD. AFVSS. No chest wall tenderness. No chest pain or shortness of breath. Neurovascularly intact. Symptoms beginning after being hit by a basketball. Doubt that this is cardiac or pulmonary in nature. Most likely inflammation from the injury. Advised her to take NSAIDs. Stable for discharge. Return precautions given. Patient states understanding of treatment care plan and is agreeable.  Carman Ching, PA-C 08/21/14 1425  Debby Freiberg, MD 08/25/14 [phone]

## 2014-08-21 NOTE — ED Notes (Signed)
Pt was hit in the chest with a basketball. Now having tingling down right arm.

## 2014-10-13 ENCOUNTER — Telehealth: Payer: Self-pay | Admitting: Oncology

## 2014-10-13 NOTE — Telephone Encounter (Signed)
pt cld to r/s appt-gave pt updated time & date °

## 2014-10-15 ENCOUNTER — Other Ambulatory Visit: Payer: 59

## 2014-10-15 ENCOUNTER — Ambulatory Visit: Payer: 59 | Admitting: Oncology

## 2014-12-22 ENCOUNTER — Other Ambulatory Visit: Payer: Self-pay | Admitting: *Deleted

## 2014-12-22 DIAGNOSIS — C50419 Malignant neoplasm of upper-outer quadrant of unspecified female breast: Secondary | ICD-10-CM

## 2014-12-23 ENCOUNTER — Other Ambulatory Visit (HOSPITAL_BASED_OUTPATIENT_CLINIC_OR_DEPARTMENT_OTHER): Payer: 59

## 2014-12-23 ENCOUNTER — Telehealth: Payer: Self-pay | Admitting: Oncology

## 2014-12-23 ENCOUNTER — Ambulatory Visit (HOSPITAL_BASED_OUTPATIENT_CLINIC_OR_DEPARTMENT_OTHER): Payer: 59 | Admitting: Oncology

## 2014-12-23 DIAGNOSIS — R51 Headache: Secondary | ICD-10-CM | POA: Diagnosis not present

## 2014-12-23 DIAGNOSIS — C50412 Malignant neoplasm of upper-outer quadrant of left female breast: Secondary | ICD-10-CM | POA: Diagnosis not present

## 2014-12-23 DIAGNOSIS — Z79811 Long term (current) use of aromatase inhibitors: Secondary | ICD-10-CM | POA: Diagnosis not present

## 2014-12-23 DIAGNOSIS — C50419 Malignant neoplasm of upper-outer quadrant of unspecified female breast: Secondary | ICD-10-CM

## 2014-12-23 DIAGNOSIS — Z170 Estrogen receptor positive status [ER+]: Secondary | ICD-10-CM | POA: Diagnosis not present

## 2014-12-23 DIAGNOSIS — C50919 Malignant neoplasm of unspecified site of unspecified female breast: Secondary | ICD-10-CM

## 2014-12-23 LAB — COMPREHENSIVE METABOLIC PANEL (CC13)
ALK PHOS: 66 U/L (ref 40–150)
ALT: 16 U/L (ref 0–55)
AST: 21 U/L (ref 5–34)
Albumin: 3.8 g/dL (ref 3.5–5.0)
Anion Gap: 7 mEq/L (ref 3–11)
BILIRUBIN TOTAL: 0.28 mg/dL (ref 0.20–1.20)
BUN: 13.3 mg/dL (ref 7.0–26.0)
CALCIUM: 8.8 mg/dL (ref 8.4–10.4)
CO2: 29 meq/L (ref 22–29)
Chloride: 106 mEq/L (ref 98–109)
Creatinine: 0.7 mg/dL (ref 0.6–1.1)
EGFR: >90 mL/min/{1.73_m2} (ref >90)
Glucose: 112 mg/dl (ref 70–140)
Potassium: 3.9 mEq/L (ref 3.5–5.1)
Sodium: 142 mEq/L (ref 136–145)
Total Protein: 6.5 g/dL (ref 6.4–8.3)

## 2014-12-23 LAB — CBC WITH DIFFERENTIAL/PLATELET
BASO%: 0.4 % (ref 0.0–2.0)
BASOS ABS: 0 10*3/uL (ref 0.0–0.1)
EOS%: 1.8 % (ref 0.0–7.0)
Eosinophils Absolute: 0.1 10*3/uL (ref 0.0–0.5)
HCT: 40.3 % (ref 34.8–46.6)
HGB: 13.3 g/dL (ref 11.6–15.9)
LYMPH%: 23.3 % (ref 14.0–49.7)
MCH: 28.7 pg (ref 25.1–34.0)
MCHC: 33 g/dL (ref 31.5–36.0)
MCV: 87 fL (ref 79.5–101.0)
MONO#: 0.5 10*3/uL (ref 0.1–0.9)
MONO%: 9.6 % (ref 0.0–14.0)
NEUT%: 64.9 % (ref 38.4–76.8)
NEUTROS ABS: 3.6 10*3/uL (ref 1.5–6.5)
Platelets: 237 10*3/uL (ref 145–400)
RBC: 4.63 10*6/uL (ref 3.70–5.45)
RDW: 13.9 % (ref 11.2–14.5)
WBC: 5.5 10*3/uL (ref 3.9–10.3)
lymph#: 1.3 10*3/uL (ref 0.9–3.3)

## 2014-12-23 MED ORDER — TAMOXIFEN CITRATE 20 MG PO TABS: 20.0000 mg | ORAL_TABLET | Freq: Every day | ORAL | Status: DC

## 2014-12-23 NOTE — Progress Notes (Signed)
ID: Erin Castillo   DOB: January 01, 1966  MR#: [id number]  VPX#:[id number]  PCP: Erin Fiedler MD IOE:[id number], EVELYN, MD SU:  OTHER MD: Erin Sit MD  CHIEF COMPLAINT: left breast cancer  CURRENT TREATMENT: tamoxifen 20mg  daily  BREAST CANCER HISTORY:  from the original intake note:  The patient noticed a dimple in the lateral left breast and was able to palpate a mass. She waited a couple of months since shewas still premenopausal, but the mass did not resolve. She brought it to the attention of Dr. Simona Castillo and underwent bilateral diagnostic mammography and left ultrasonography at [facility] on 04/20/2011. Compression views showed a spiculated mass in the upper outer quadrant of the left breast measuring 2.1 cm. By ultrasound, this was a lobulated, irregular, and hypoechoic mass measuring 1.8 cm. No abnormal appearing lymph nodes in the axilla.  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 The tumor was ER positive at 98%, PR positive at 100%, HER-2/neu negative with a CISH ratio of 1.42, and an MIB-1 of 9%.  The patient underwent bilateral breast MRI on 10-12, confirming a 2.3 cm irregularly marginated, enhancing mass, deep in the upper outer quadrant of the left breast. This was close to the underlying pectoralis major, but without definite extension into the muscle. No other suspicious areas in either breast, and no abnormal appearing lymph nodes were noted. An incidental 1.1 cm anterior liver cyst was noted.  The patient underwentt left lumpectomy and sentinel lymph node sampling on 05/15/2011 under Erin Castillo. 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 No evidence of lymphovascular invasion. One of one sentinel lymph nodes was involved.  An Oncotype DX had already been requested, since initially the patient appeared to be node negative  (NCCN guidelines do not recommend this test in node-positive patients). This showed the patient to be on the low risk side. She is known to be BRCA 1 and 2 negative. Her subsequent history is as detailed below.  INTERVAL HISTORY: Erin Castillo returns today for follow up of her breast cancer accompanied by her daughter Erin Castillo.  Erin Castillo continues on tamoxifen, which she tolerates well. In particular hot flashes and vaginal wetness are not a major concern. She is obtaining the drug for approximately $30 month  REVIEW OF SYSTEMS: Erin Castillo  Complains of severe headaches localizing to the left temporal area. These have been present for about 2 months. They're very frequent. They wake her up at night. There are not accompanied by dizziness, nausea, vomiting, visual changes, or or. She does have a history of migraines, and sometimes takes Imitrex for these headaches with variable results. These headaches do not feel to her like her usual migraine headaches. Aside from this problem, a detailed review of systems today was otherwise stable  PAST MEDICAL HISTORY: Past Medical History  Diagnosis Date  . Migraines   . Anxiety   . Depression   . Breast cancer 04/20/11    Left, ER/PR+  . History of radiation therapy     09/18/11 to 11/03/11, left breast    PAST SURGICAL HISTORY: Past Surgical History  Procedure Laterality Date  . Nasal sinus surgery  1991  . Cesarean section  2000  . Breast lumpectomy  05/15/11    left breast lumpectomy   . Sinus exploration      remote  . Mastectomy partial / lumpectomy w/ axillary lymphadenectomy  10/12  snbx  . Portacath placement  06/12/2011    Procedure: INSERTION PORT-A-CATH;  Surgeon: Edward Jolly, MD;  Location: [place];  Service: General;  Laterality: Right;  Right Subclavian Vein  . Port-a-cath removal  12/12/2011    Procedure: REMOVAL PORT-A-CATH;  Surgeon: Edward Jolly, MD;  Location: [facility];  Service: General;  Laterality: Right;     FAMILY HISTORY Family History  Problem Relation Age of Onset  . Cancer Father     bladder cancer  . Diabetes Father   The patient's father is alive at age 74.  The patient's mother is alive at age 61.  The patient's father did have bladder cancer but has done well with that.  She has 3 sisters, no brothers.  There is no history of breast or ovarian cancer in the family.  GYNECOLOGIC HISTORY: She had menarche at age 58. The patient is GX, P3. She understands that the fact that she did not deliver a child before the age of 49 does double her risk of breast cancer developing. She stopped having periods after her first chemotherapy, November of 2012.    SOCIAL HISTORY: She works as an Glass blower/designer for [employer]. Her friend, Erin Castillo, who actually heads the board for [employer], often accompanies Erin Castillo to her appointments. The patient is divorced. Her former husband, Erin Castillo, works at [employer]. Children are Erin Castillo, age 31, and twins Erin Castillo, age 24 and Erin Castillo, age 86. The patient attends [employer].   ADVANCED DIRECTIVES: Not in place, but the patient tells me when she feels the mouth she intends to name her mother, Erin Castillo, as her healthcare power of attorney  HEALTH MAINTENANCE: History  Substance Use Topics  . Smoking status: Never Smoker   . Smokeless tobacco: Never Used  . Alcohol Use: 0.0 oz/week    2-3 Glasses of wine per week     Comment: occassionally     Colonoscopy:  Sept 2013, Dr. Penelope Castillo  PAP:  Bone density: Never  Lipid panel:  No Known Allergies  Current Outpatient Prescriptions  Medication Sig Dispense Refill  . b complex vitamins tablet Take 1 tablet by mouth daily.     . citalopram (CELEXA) 20 MG tablet Take 20 mg by mouth daily with breakfast.     . mesalamine (LIALDA) 1.2 G EC tablet Take 1,200 mg by mouth daily with breakfast.    . Multiple Vitamin (MULITIVITAMIN WITH MINERALS) TABS Take 1 tablet by mouth  daily.    . predniSONE (DELTASONE) 20 MG tablet Take 20 mg by mouth daily.  0  . tamoxifen (NOLVADEX) 20 MG tablet Take 1 tablet (20 mg total) by mouth at bedtime. 90 tablet 4  . venlafaxine XR (EFFEXOR-XR) 37.5 MG 24 hr capsule TAKE 2 CAPSULES BY MOUTH ONCE DAILY 60 capsule 3   No current facility-administered medications for this visit.    OBJECTIVE: Middle-aged white woman  Who appears stated age There were no vitals filed for this visit.   There is no weight on file to calculate BMI.    ECOG FS: 1  Sclerae unicteric, EOMs intact;  Inspection on palpation of the left frontoparietal area is unremarkable Oropharynx clear, dentition in good repair No cervical or supraclavicular adenopathy Lungs no rales or rhonchi Heart regular rate and rhythm Abd soft, nontender, positive bowel sounds MSK no focal spinal tenderness, no upper extremity lymphedema Neuro: nonfocal, well oriented, appropriate affect Breasts:  The right breast is unremarkable per  the left breast is status post lumpectomy and radiation. There is no evidence of local recurrence.   LAB RESULTS: Lab Results  Component Value Date   WBC 5.5 12/23/2014   NEUTROABS 3.6 12/23/2014   HGB 13.3 12/23/2014   HCT 40.3 12/23/2014   MCV 87.0 12/23/2014   PLT 237 12/23/2014       Chemistry      Component Value Date/Time   NA 142 12/23/2014 1411   NA 138 03/05/2012 1005   K 3.9 12/23/2014 1411   K 4.2 03/05/2012 1005   CL 105 09/09/2012 1025   CL 102 03/05/2012 1005   CO2 29 12/23/2014 1411   CO2 30 03/05/2012 1005   BUN 13.3 12/23/2014 1411   BUN 16 03/05/2012 1005   CREATININE 0.7 12/23/2014 1411   CREATININE 0.66 03/05/2012 1005      Component Value Date/Time   CALCIUM 8.8 12/23/2014 1411   CALCIUM 9.3 03/05/2012 1005   ALKPHOS 66 12/23/2014 1411   ALKPHOS 68 03/05/2012 1005   AST 21 12/23/2014 1411   AST 17 03/05/2012 1005   ALT 16 12/23/2014 1411   ALT 12 03/05/2012 1005   BILITOT 0.28 12/23/2014 1411    BILITOT 0.3 03/05/2012 1005       Lab Results  Component Value Date   LABCA2 22 11/28/2011     STUDIES: Most recent mammogram on 03/24/14 was  benign  ASSESSMENT: 49 y.o. BRCA negative [place] woman   (1) Status post left lumpectomy and sentinel lymph node biopsy October of 2012 for a T2 N1, stage IIB invasive ductal carcinoma, grade 2,  strongly estrogen and progesterone receptor positive, with an MIB-1 of 9% and no HER-2/neu amplification.   (2) Treated initially with docetaxel, doxorubicin, and cyclophosphamide for one cycle, with very poor tolerance   (3) status post cyclophosphamide/docetaxel, for an additional three cycles completed January 2013  (4)  Status post radiation, completed mid-April 2013  (5)  on tamoxifen as of April 2013--`plan is to continue for 10 years  (6) ulcerative colitis-- followed By Dr Erin Castillo  (7) left temporal headaches-- MRI pending  PLAN: Erin Castillo's headaches are most likely a variant migraine , but it is troubling that they wake her up at night and that they have been going on now for 2 months. They also do not have the usual symptoms accompanying classic migraines. I think the best thing to do is to obtain a brain MRI to start with and if that is clear consider referral to a headache clinic or simply start Elavil clinically and then if that does not work refer her to a headache clinic. She does have Imitrex in hand and that does seem to work to some extent   she is pale little more for tamoxifen than I am used to and I wrote her a hand written prescription so she can shop at period since she is a member of [employer] that's probably receiving get the best Price  From a breast cancer point of view otherwise she is doing great and she is now 3 years into the 10 year tamoxifen plan. She is going to see me again in February of 2017 and then October  2017, at which point we will start seeing her on a once a year basis.   Dwight Adamczak C    12/23/2014

## 2014-12-23 NOTE — Telephone Encounter (Signed)
Patient aware that central scheduling will call for scans and will check my chart for follow up appointments

## 2015-01-05 ENCOUNTER — Ambulatory Visit (HOSPITAL_COMMUNITY)
Admission: RE | Admit: 2015-01-05 | Discharge: 2015-01-05 | Disposition: A | Discharge status: Home / Self Care | Payer: 59 | Source: Ambulatory Visit | Attending: Oncology | Admitting: Oncology

## 2015-01-05 DIAGNOSIS — R110 Nausea: Secondary | ICD-10-CM | POA: Diagnosis not present

## 2015-01-05 DIAGNOSIS — R930 Abnormal findings on diagnostic imaging of skull and head, not elsewhere classified: Secondary | ICD-10-CM | POA: Insufficient documentation

## 2015-01-05 DIAGNOSIS — C50919 Malignant neoplasm of unspecified site of unspecified female breast: Secondary | ICD-10-CM | POA: Diagnosis not present

## 2015-01-05 DIAGNOSIS — R51 Headache: Secondary | ICD-10-CM | POA: Diagnosis not present

## 2015-01-05 MED ORDER — GADOBENATE DIMEGLUMINE 529 MG/ML IV SOLN
15.0000 mL | Freq: Once | INTRAVENOUS | Status: AC | PRN
  Administered 2015-01-05: 13 mL via INTRAVENOUS

## 2015-02-17 ENCOUNTER — Telehealth: Payer: Self-pay | Admitting: Oncology

## 2015-02-17 NOTE — Telephone Encounter (Signed)
Left message confirming change in MD schedule moved appointment to earlier.

## 2015-04-05 ENCOUNTER — Other Ambulatory Visit: Payer: Self-pay | Admitting: Obstetrics and Gynecology

## 2015-04-05 ENCOUNTER — Other Ambulatory Visit (HOSPITAL_COMMUNITY)
Admission: RE | Admit: 2015-04-05 | Discharge: 2015-04-05 | Disposition: A | Discharge status: Home / Self Care | Payer: 59 | Source: Ambulatory Visit | Attending: Obstetrics and Gynecology | Admitting: Obstetrics and Gynecology

## 2015-04-05 DIAGNOSIS — Z113 Encounter for screening for infections with a predominantly sexual mode of transmission: Secondary | ICD-10-CM | POA: Diagnosis present

## 2015-04-05 DIAGNOSIS — Z01419 Encounter for gynecological examination (general) (routine) without abnormal findings: Secondary | ICD-10-CM | POA: Diagnosis present

## 2015-04-05 DIAGNOSIS — Z1151 Encounter for screening for human papillomavirus (HPV): Secondary | ICD-10-CM | POA: Diagnosis not present

## 2015-04-07 LAB — CYTOLOGY - PAP

## 2015-08-24 ENCOUNTER — Telehealth: Payer: Self-pay | Admitting: Oncology

## 2015-08-24 NOTE — Telephone Encounter (Signed)
Patient called in to reschedule her lab

## 2015-08-25 ENCOUNTER — Other Ambulatory Visit: Payer: Self-pay

## 2015-08-25 DIAGNOSIS — C50412 Malignant neoplasm of upper-outer quadrant of left female breast: Secondary | ICD-10-CM

## 2015-08-26 ENCOUNTER — Other Ambulatory Visit: Payer: Self-pay

## 2015-08-26 ENCOUNTER — Telehealth: Payer: Self-pay | Admitting: Oncology

## 2015-08-26 NOTE — Telephone Encounter (Signed)
Patient called as she missed her lab appointment today,i have r/s it and left her a message   Erin Castillo

## 2015-09-02 ENCOUNTER — Ambulatory Visit: Payer: Self-pay | Admitting: Oncology

## 2015-09-02 ENCOUNTER — Telehealth: Payer: Self-pay | Admitting: Oncology

## 2015-09-02 ENCOUNTER — Other Ambulatory Visit (HOSPITAL_BASED_OUTPATIENT_CLINIC_OR_DEPARTMENT_OTHER): Payer: Commercial Managed Care - HMO

## 2015-09-02 ENCOUNTER — Ambulatory Visit (HOSPITAL_BASED_OUTPATIENT_CLINIC_OR_DEPARTMENT_OTHER): Payer: Commercial Managed Care - HMO | Admitting: Oncology

## 2015-09-02 VITALS — BP 111/83 | HR 91 | Temp 98.2°F | Resp 18 | Ht 65.0 in | Wt 162.0 lb

## 2015-09-02 DIAGNOSIS — Z79811 Long term (current) use of aromatase inhibitors: Secondary | ICD-10-CM

## 2015-09-02 DIAGNOSIS — Z170 Estrogen receptor positive status [ER+]: Secondary | ICD-10-CM

## 2015-09-02 DIAGNOSIS — C50412 Malignant neoplasm of upper-outer quadrant of left female breast: Secondary | ICD-10-CM | POA: Diagnosis not present

## 2015-09-02 LAB — CBC WITH DIFFERENTIAL/PLATELET
BASO%: 0.3 % (ref 0.0–2.0)
Basophils Absolute: 0 10*3/uL (ref 0.0–0.1)
EOS%: 1.3 % (ref 0.0–7.0)
Eosinophils Absolute: 0.1 10*3/uL (ref 0.0–0.5)
HCT: 41.9 % (ref 34.8–46.6)
HGB: 13.9 g/dL (ref 11.6–15.9)
LYMPH%: 22.2 % (ref 14.0–49.7)
MCH: 28.8 pg (ref 25.1–34.0)
MCHC: 33.2 g/dL (ref 31.5–36.0)
MCV: 86.7 fL (ref 79.5–101.0)
MONO#: 0.6 10*3/uL (ref 0.1–0.9)
MONO%: 8.9 % (ref 0.0–14.0)
NEUT#: 4.1 10*3/uL (ref 1.5–6.5)
NEUT%: 67.3 % (ref 38.4–76.8)
Platelets: 250 10*3/uL (ref 145–400)
RBC: 4.83 10*6/uL (ref 3.70–5.45)
RDW: 13.8 % (ref 11.2–14.5)
WBC: 6.2 10*3/uL (ref 3.9–10.3)
lymph#: 1.4 10*3/uL (ref 0.9–3.3)

## 2015-09-02 LAB — COMPREHENSIVE METABOLIC PANEL
ALT: 17 U/L (ref 0–55)
ANION GAP: 9 meq/L (ref 3–11)
AST: 21 U/L (ref 5–34)
Albumin: 4.2 g/dL (ref 3.5–5.0)
Alkaline Phosphatase: 58 U/L (ref 40–150)
BUN: 10.9 mg/dL (ref 7.0–26.0)
CHLORIDE: 106 meq/L (ref 98–109)
CO2: 28 meq/L (ref 22–29)
CREATININE: 0.8 mg/dL (ref 0.6–1.1)
Calcium: 9.2 mg/dL (ref 8.4–10.4)
EGFR: >90 mL/min/{1.73_m2} (ref >90)
Glucose: 96 mg/dl (ref 70–140)
POTASSIUM: 4.1 meq/L (ref 3.5–5.1)
Sodium: 143 mEq/L (ref 136–145)
Total Bilirubin: 0.32 mg/dL (ref 0.20–1.20)
Total Protein: 7.1 g/dL (ref 6.4–8.3)

## 2015-09-02 MED ORDER — TAMOXIFEN CITRATE 20 MG PO TABS: 20.0000 mg | ORAL_TABLET | Freq: Every day | ORAL | Status: DC

## 2015-09-02 NOTE — Telephone Encounter (Signed)
Scheduled patient appt per pof, avs report printed.  °

## 2015-09-02 NOTE — Progress Notes (Signed)
ID: [name]   DOB: 1965/09/11  MR#: [id number]  MWN#:[id number]  PCP: Anastasia Fiedler MD QIH:[id number], EVELYN, MD SU:  OTHER MD: Acquanetta Sit MD  CHIEF COMPLAINT: left breast cancer  CURRENT TREATMENT: tamoxifen '20mg'$  daily  BREAST CANCER HISTORY:  from the original intake note:  The patient noticed a dimple in the lateral left breast and was able to palpate a mass. She waited a couple of months since shewas still premenopausal, but the mass did not resolve. She brought it to the attention of Dr. Simona Huh and underwent bilateral diagnostic mammography and left ultrasonography at [facility] on 04/20/2011. Compression views showed a spiculated mass in the upper outer quadrant of the left breast measuring 2.1 cm. By ultrasound, this was a lobulated, irregular, and hypoechoic mass measuring 1.8 cm. No abnormal appearing lymph nodes in the axilla.  Biopsy was performed on 04/20/2011 [id number]) showing an invasive ductal carcinoma which on the pathology report was described as grade 2. The tumor was ER positive at 98%, PR positive at 100%, HER-2/neu negative with a CISH ratio of 1.42, and an MIB-1 of 9%.  The patient underwent bilateral breast MRI on 10-12, confirming a 2.3 cm irregularly marginated, enhancing mass, deep in the upper outer quadrant of the left breast. This was close to the underlying pectoralis major, but without definite extension into the muscle. No other suspicious areas in either breast, and no abnormal appearing lymph nodes were noted. An incidental 1.1 cm anterior liver cyst was noted.  The patient underwentt left lumpectomy and sentinel lymph node sampling on 05/15/2011 under Dr. Excell Seltzer. 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 No evidence of lymphovascular invasion. One of one sentinel lymph nodes was involved.  An Oncotype DX had already been requested, since initially the patient appeared to be node negative  (NCCN guidelines do not recommend this test in node-positive patients). This showed the patient to be on the low risk side. She is known to be BRCA 1 and 2 negative. Her subsequent history is as detailed below.  INTERVAL HISTORY: Taisia returns today for follow up of her estrogen receptor positivebreast cancer. The interval history is generally unremarkable. She continues on tamoxifen, with excellent tolerance. Hot flashes are occasional and they seldom wake her up. She doesn't have any vaginal wetness issues. She pays $10 a month for this.  REVIEW OF SYSTEMS: Haven's headaches have gotten much better. She cannot tell me exactly why. He has been no dizziness, visual changes, nausea, or vomiting. She denies cough, phlegm production, or pleurisy. There's been no change in bowel or bladder habits. The only problem is she just doesn't have any time to exercise. A detailed review of systems was otherwise negative.  PAST MEDICAL HISTORY: Past Medical History  Diagnosis Date  . Migraines   . Anxiety   . Depression   . Breast cancer 04/20/11    Left, ER/PR+  . History of radiation therapy     09/18/11 to 11/03/11, left breast    PAST SURGICAL HISTORY: Past Surgical History  Procedure Laterality Date  . Nasal sinus surgery  1991  . Cesarean section  2000  . Breast lumpectomy  05/15/11    left breast lumpectomy   . Sinus exploration      remote  . Mastectomy partial / lumpectomy w/ axillary lymphadenectomy  10/12    snbx  . Portacath placement  06/12/2011    Procedure: INSERTION PORT-A-CATH;  Surgeon: Edward Jolly, MD;  Location: [place];  Service: General;  Laterality: Right;  Right Subclavian Vein  . Port-a-cath removal  12/12/2011    Procedure: REMOVAL PORT-A-CATH;  Surgeon: Edward Jolly, MD;  Location: [facility];  Service: General;  Laterality: Right;    FAMILY HISTORY Family History  Problem Relation Age of Onset  . Cancer Father     bladder cancer  .  Diabetes Father   The patient's father is alive at age 56.  The patient's mother is alive at age 39.  The patient's father did have bladder cancer but has done well with that.  She has 3 sisters, no brothers.  There is no history of breast or ovarian cancer in the family.  GYNECOLOGIC HISTORY: She had menarche at age 68. The patient is GX, P3. She understands that the fact that she did not deliver a child before the age of 21 does double her risk of breast cancer developing. She stopped having periods after her first chemotherapy, November of 2012.    SOCIAL HISTORY: She works as an Glass blower/designer for [employer]. Her friend, Jac Canavan, who actually heads the board for [employer], often accompanies Troyce to her appointments. The patient is divorced. Her former husband, Seth Bake, works at [employer]. Children are Rodman Key, age 106, and twins Chrys Racer, age 49 and Remo Lipps, age 64. The patient attends [employer].   ADVANCED DIRECTIVES: Not in place, but the patient tells me (09/02/2015) she intends to [facility] of her sisters as her healthcare power of attorney; the appropriate forms were giving her to complete and notarize at her discretion.  HEALTH MAINTENANCE: Social History  Substance Use Topics  . Smoking status: Never Smoker   . Smokeless tobacco: Never Used  . Alcohol Use: 0.0 oz/week    2-3 Glasses of wine per week     Comment: occassionally     Colonoscopy:  Sept 2013, Dr. Penelope Coop  PAP:  Bone density: Never  Lipid panel:  No Known Allergies  Current Outpatient Prescriptions  Medication Sig Dispense Refill  . b complex vitamins tablet Take 1 tablet by mouth daily.     . citalopram (CELEXA) 20 MG tablet Take 20 mg by mouth daily with breakfast.     . mesalamine (LIALDA) 1.2 G EC tablet Take 1,200 mg by mouth daily with breakfast.    . Multiple Vitamin (MULITIVITAMIN WITH MINERALS) TABS Take 1 tablet by mouth daily.    . predniSONE (DELTASONE) 20 MG tablet  Take 20 mg by mouth daily.  0  . tamoxifen (NOLVADEX) 20 MG tablet Take 1 tablet (20 mg total) by mouth at bedtime. 90 tablet 4  . venlafaxine XR (EFFEXOR-XR) 37.5 MG 24 hr capsule TAKE 2 CAPSULES BY MOUTH ONCE DAILY 60 capsule 3   No current facility-administered medications for this visit.    OBJECTIVE: Middle-aged white woman  whoappears well Filed Vitals:   09/02/15 1347  BP: 111/83  Pulse: 91  Temp: 98.2 F (36.8 C)  Resp: 18     Body mass index is 26.96 kg/(m^2).    ECOG FS: 0  Sclerae unicteric, pupils round and equal Oropharynx clear and moist-- no thrush or other lesions No cervical or supraclavicular adenopathy Lungs no rales or rhonchi Heart regular rate and rhythm Abd soft, nontender, positive bowel sounds MSK no focal spinal tenderness, no upper extremity lymphedema Neuro: nonfocal, well oriented, appropriate affect Breasts: the right breast is unremarkable. The left breast is status post lumpectomy and radiation. There  is no evidence of local recurrence. Left axilla is benign.     LAB RESULTS: Lab Results  Component Value Date   WBC 6.2 09/02/2015   NEUTROABS 4.1 09/02/2015   HGB 13.9 09/02/2015   HCT 41.9 09/02/2015   MCV 86.7 09/02/2015   PLT 250 09/02/2015       Chemistry      Component Value Date/Time   NA 142 12/23/2014 1411   NA 138 03/05/2012 1005   K 3.9 12/23/2014 1411   K 4.2 03/05/2012 1005   CL 105 09/09/2012 1025   CL 102 03/05/2012 1005   CO2 29 12/23/2014 1411   CO2 30 03/05/2012 1005   BUN 13.3 12/23/2014 1411   BUN 16 03/05/2012 1005   CREATININE 0.7 12/23/2014 1411   CREATININE 0.66 03/05/2012 1005      Component Value Date/Time   CALCIUM 8.8 12/23/2014 1411   CALCIUM 9.3 03/05/2012 1005   ALKPHOS 66 12/23/2014 1411   ALKPHOS 68 03/05/2012 1005   AST 21 12/23/2014 1411   AST 17 03/05/2012 1005   ALT 16 12/23/2014 1411   ALT 12 03/05/2012 1005   BILITOT 0.28 12/23/2014 1411   BILITOT 0.3 03/05/2012 1005       Lab  Results  Component Value Date   LABCA2 22 11/28/2011     STUDIES: No results found.  ASSESSMENT: 50 y.o. BRCA negative [place] woman   (1) Status post left lumpectomy and sentinel lymph node biopsy October of 2012 for a T2 N1, stage IIB invasive ductal carcinoma, grade 2,  strongly estrogen and progesterone receptor positive, with an MIB-1 of 9% and no HER-2/neu amplification.   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 This is very favorable.  She is tolerating tamoxifen well. The plan is to continue tamoxifen for a minimum of 5 years and then consider whether or not to continue another 5 years, switched to on aromatase inhibitors for 2 or 3 years, or "get out of the cancer business".  I urged her to get her healthcare power of attorney form completed.  She does have a treadmill at home. If she places is a father TV it might be possible for her to get 20-40 minutes most days while watching the news. In the long run that would be an important thing for her to do.  Otherwise she knows to call me for any problems that may develop before the next visit here, which will be in one year. MAGRINAT,GUSTAV C    09/02/2015

## 2016-02-11 ENCOUNTER — Telehealth: Payer: Self-pay | Admitting: Oncology

## 2016-02-11 NOTE — Telephone Encounter (Signed)
Called patient to confirm appointment. Left voice message. Appointment letter and schedule mailed. Erin F. °

## 2016-08-31 ENCOUNTER — Ambulatory Visit: Payer: Self-pay | Admitting: Oncology

## 2016-08-31 ENCOUNTER — Other Ambulatory Visit: Payer: Self-pay

## 2016-09-06 ENCOUNTER — Other Ambulatory Visit: Payer: Self-pay | Admitting: *Deleted

## 2016-09-06 DIAGNOSIS — C50412 Malignant neoplasm of upper-outer quadrant of left female breast: Secondary | ICD-10-CM

## 2016-09-07 ENCOUNTER — Other Ambulatory Visit (HOSPITAL_BASED_OUTPATIENT_CLINIC_OR_DEPARTMENT_OTHER): Payer: Commercial Managed Care - HMO

## 2016-09-07 ENCOUNTER — Ambulatory Visit (HOSPITAL_BASED_OUTPATIENT_CLINIC_OR_DEPARTMENT_OTHER): Payer: Commercial Managed Care - HMO | Admitting: Oncology

## 2016-09-07 VITALS — BP 109/72 | HR 83 | Temp 97.8°F | Resp 18 | Ht 65.0 in | Wt 155.7 lb

## 2016-09-07 DIAGNOSIS — C50412 Malignant neoplasm of upper-outer quadrant of left female breast: Secondary | ICD-10-CM

## 2016-09-07 DIAGNOSIS — Z79811 Long term (current) use of aromatase inhibitors: Secondary | ICD-10-CM

## 2016-09-07 DIAGNOSIS — Z170 Estrogen receptor positive status [ER+]: Secondary | ICD-10-CM

## 2016-09-07 LAB — CBC WITH DIFFERENTIAL/PLATELET
BASO%: 0.4 % (ref 0.0–2.0)
Basophils Absolute: 0 10*3/uL (ref 0.0–0.1)
EOS%: 1.1 % (ref 0.0–7.0)
Eosinophils Absolute: 0.1 10*3/uL (ref 0.0–0.5)
HCT: 44.6 % (ref 34.8–46.6)
HGB: 14.8 g/dL (ref 11.6–15.9)
LYMPH%: 27 % (ref 14.0–49.7)
MCH: 28.6 pg (ref 25.1–34.0)
MCHC: 33.2 g/dL (ref 31.5–36.0)
MCV: 86.1 fL (ref 79.5–101.0)
MONO#: 0.6 10*3/uL (ref 0.1–0.9)
MONO%: 10 % (ref 0.0–14.0)
NEUT#: 3.4 10*3/uL (ref 1.5–6.5)
NEUT%: 61.5 % (ref 38.4–76.8)
PLATELETS: 233 10*3/uL (ref 145–400)
RBC: 5.18 10*6/uL (ref 3.70–5.45)
RDW: 13.7 % (ref 11.2–14.5)
WBC: 5.5 10*3/uL (ref 3.9–10.3)
lymph#: 1.5 10*3/uL (ref 0.9–3.3)

## 2016-09-07 LAB — COMPREHENSIVE METABOLIC PANEL
ALT: 15 U/L (ref 0–55)
ANION GAP: 9 meq/L (ref 3–11)
AST: 18 U/L (ref 5–34)
Albumin: 4.4 g/dL (ref 3.5–5.0)
Alkaline Phosphatase: 60 U/L (ref 40–150)
BUN: 14.2 mg/dL (ref 7.0–26.0)
CHLORIDE: 103 meq/L (ref 98–109)
CO2: 29 mEq/L (ref 22–29)
Calcium: 9.6 mg/dL (ref 8.4–10.4)
Creatinine: 0.7 mg/dL (ref 0.6–1.1)
Glucose: 84 mg/dl (ref 70–140)
Potassium: 3.6 mEq/L (ref 3.5–5.1)
Sodium: 141 mEq/L (ref 136–145)
Total Bilirubin: 0.46 mg/dL (ref 0.20–1.20)
Total Protein: 7.3 g/dL (ref 6.4–8.3)

## 2016-09-07 MED ORDER — ANASTROZOLE 1 MG PO TABS: 1.0000 mg | ORAL_TABLET | Freq: Every day | ORAL | 4 refills | Status: DC

## 2016-09-07 NOTE — Progress Notes (Signed)
ID: [name]   DOB: 01-09-1966  MR#: [id number]  GUR#:[id number]  PCP: Erin Fiedler MD EGB:TDVVOHY, EVELYN, MD SU:  OTHER MD: Erin Sit MD  CHIEF COMPLAINT: left breast cancer  CURRENT TREATMENT: Anastrozole BREAST CANCER HISTORY:  from the original intake note:  The patient noticed a dimple in the lateral left breast and was able to palpate a mass. She waited a couple of months since shewas still premenopausal, but the mass did not resolve. She brought it to the attention of Dr. Simona Castillo and underwent bilateral diagnostic mammography and left ultrasonography at [facility] on 04/20/2011. Compression views showed a spiculated mass in the upper outer quadrant of the left breast measuring 2.1 cm. By ultrasound, this was a lobulated, irregular, and hypoechoic mass measuring 1.8 cm. No abnormal appearing lymph nodes in the axilla.  Biopsy was performed on 04/20/2011 [id number]) showing an invasive ductal carcinoma which on the pathology report was described as grade 2. The tumor was ER positive at 98%, PR positive at 100%, HER-2/neu negative with a CISH ratio of 1.42, and an MIB-1 of 9%.  The patient underwent bilateral breast MRI on 10-12, confirming a 2.3 cm irregularly marginated, enhancing mass, deep in the upper outer quadrant of the left breast. This was close to the underlying pectoralis major, but without definite extension into the muscle. No other suspicious areas in either breast, and no abnormal appearing lymph nodes were noted. An incidental 1.1 cm anterior liver cyst was noted.  The patient underwentt left lumpectomy and sentinel lymph node sampling on 05/15/2011 under Dr. Excell Seltzer. 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 No evidence of lymphovascular invasion. One of one sentinel lymph nodes was involved.  An Oncotype DX had already been requested, since initially the patient appeared to be node negative (NCCN  guidelines do not recommend this test in node-positive patients). This showed the patient to be on the low risk side. She is known to be BRCA 1 and 2 negative. Her subsequent history is as detailed below.  INTERVAL HISTORY: Erin Castillo today for follow-up of her node-positive estrogen receptor positive breast cancer. The interval history is generally benign. She tolerates tamoxifen well. She has mild hot flashes no significant problems with vaginal discharge. She is spending approximately $20 a month for this drug.   REVIEW OF SYSTEMS: Erin Castillo's ulcerative colitis is well-controlled on her mesalamine. She exercises mostly by walking. Most days she gets about 5000 steps. Her goal is 10,000. A detailed review of systems today was otherwise stable  PAST MEDICAL HISTORY: Past Medical History:  Diagnosis Date  . Anxiety   . Breast cancer 04/20/11   Left, ER/PR+  . Depression   . History of radiation therapy    09/18/11 to 11/03/11, left breast  . Migraines     PAST SURGICAL HISTORY: Past Surgical History:  Procedure Laterality Date  . BREAST LUMPECTOMY  05/15/11   left breast lumpectomy   . CESAREAN SECTION  2000  . MASTECTOMY PARTIAL / LUMPECTOMY W/ AXILLARY LYMPHADENECTOMY  10/12   snbx  . NASAL SINUS SURGERY  1991  . PORT-A-CATH REMOVAL  12/12/2011   Procedure: REMOVAL PORT-A-CATH;  Surgeon: Erin Jolly, MD;  Location: [facility];  Service: General;  Laterality: Right;  . PORTACATH PLACEMENT  06/12/2011   Procedure: INSERTION PORT-A-CATH;  Surgeon: Erin Jolly, MD;  Location: [facility];  Service: General;  Laterality: Right;  Right Subclavian Vein  . SINUS EXPLORATION  remote    FAMILY HISTORY Family History  Problem Relation Age of Onset  . Cancer Father     bladder cancer  . Diabetes Father   The patient's father is alive at age 5.  The patient's mother is alive at age 26.  The patient's father did have bladder cancer but has done well with  that.  She has 3 sisters, no brothers.  There is no history of breast or ovarian cancer in the family.  GYNECOLOGIC HISTORY: She had menarche at age 30. The patient is GX, P3. She understands that the fact that she did not deliver a child before the age of 72 does double her risk of breast cancer developing. She stopped having periods after her first chemotherapy, November of 2012.    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 The patient is divorced. Her former husband, Erin Castillo, works at [employer]. Children are Erin Castillo, completing high school May 2018; and twins Erin Castillo and Erin Castillo, currently Juniorsin high school.The patient attends [employer].   ADVANCED DIRECTIVES: Not in place, but the patient tells me (09/02/2015) she intends to [facility] of her sisters as her healthcare power of attorney; the appropriate forms were giving her to complete and notarize at her discretion.  HEALTH MAINTENANCE: Social History  Substance Use Topics  . Smoking status: Never Smoker  . Smokeless tobacco: Never Used  . Alcohol use 0.0 oz/week    2 - 3 Glasses of wine per week     Comment: occassionally     Colonoscopy:  Sept 2013, Erin Castillo  PAP:  Bone density: Due September 2018  Lipid panel:  No Known Allergies  Current Outpatient Prescriptions  Medication Sig Dispense Refill  . anastrozole (ARIMIDEX) 1 MG tablet Take 1 tablet (1 mg total) by mouth daily. 90 tablet 4  . b complex vitamins tablet Take 1 tablet by mouth daily.     . mesalamine (LIALDA) 1.2 G EC tablet Take 1,200 mg by mouth daily with breakfast.    . Multiple Vitamin (MULITIVITAMIN WITH MINERALS) TABS Take 1 tablet by mouth daily.    Marland Kitchen venlafaxine XR (EFFEXOR-XR) 37.5 MG 24 hr capsule TAKE 2 CAPSULES BY MOUTH ONCE DAILY 60 capsule 3   No current facility-administered medications for this visit.     OBJECTIVE: Middle-aged white woman In no acute  distress Vitals:   09/07/16 1408  BP: 109/72  Pulse: 83  Resp: 18  Temp: 97.8 F (36.6 C)     Body mass index is 25.91 kg/m.    ECOG FS: 0  Sclerae unicteric, EOMs intact Oropharynx clear and moist No cervical or supraclavicular adenopathy Lungs no rales or rhonchi Heart regular rate and rhythm Abd soft, nontender, positive bowel sounds MSK no focal spinal tenderness, no upper extremity lymphedema Neuro: nonfocal, well oriented, appropriate affect Breasts: The right breast is benign. The left breast is status post lumpectomy and radiation with no evidence of local recurrence. Both axillae are benign.  LAB RESULTS: Lab Results  Component Value Date   WBC 5.5 09/07/2016   NEUTROABS 3.4 09/07/2016   HGB 14.8 09/07/2016   HCT 44.6 09/07/2016   MCV 86.1 09/07/2016   PLT 233 09/07/2016       Chemistry      Component Value Date/Time   NA 141 09/07/2016 1346   K 3.6 09/07/2016 1346   CL 105 09/09/2012 1025   CO2 29 09/07/2016 1346   BUN 14.2  09/07/2016 1346   CREATININE 0.7 09/07/2016 1346      Component Value Date/Time   CALCIUM 9.6 09/07/2016 1346   ALKPHOS 60 09/07/2016 1346   AST 18 09/07/2016 1346   ALT 15 09/07/2016 1346   BILITOT 0.46 09/07/2016 1346       Lab Results  Component Value Date   LABCA2 22 11/28/2011     STUDIES: No results found.  ASSESSMENT: 51 y.o. BRCA negative [place] woman   (1) Status post left lumpectomy and sentinel lymph node biopsy October of 2012 for a T2 N1, stage IIB invasive ductal carcinoma, grade 2,  strongly estrogen and progesterone receptor positive, with an MIB-1 of 9% and no HER-2/neu amplification.   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 This is very favorable.  She tolerates tamoxifen well and she will complete 5 years on this drug next month. The question is were to go from here.  In many of my patients or stage I I recommend stopping anti-estrogens at 5 years since continuing beyond that likely would give them very little further risk reduction given their excellent prognosis at baseline.  HEENT Adriel's case I am concerned that she was lymph node positive and if she can tolerate anti-estrogens a little bit longer if she can get up to 3% further risk  We discussed continuing tamoxifen another 5 years or switching to an aromatase inhibitor and doing 2 years on that. We discussed the possible toxicities side effects and complications of anastrozole.  After all this what she would like to do is give anastrozole a try. If she tolerates it well she will take it for 2 years and then "get out of the cancer business".  I am adding a bone density to her mammogram at [facility] in September. I will see her again in October. If all goes well she will continue on anastrozole through the spring of 2020 and then "graduate".  She knows to call for any problems that may develop before her next visit. Erin Castillo C    09/07/2016

## 2017-04-27 ENCOUNTER — Other Ambulatory Visit: Payer: Self-pay | Admitting: *Deleted

## 2017-04-27 DIAGNOSIS — C50412 Malignant neoplasm of upper-outer quadrant of left female breast: Secondary | ICD-10-CM

## 2017-04-27 DIAGNOSIS — Z170 Estrogen receptor positive status [ER+]: Principal | ICD-10-CM

## 2017-04-30 ENCOUNTER — Other Ambulatory Visit: Payer: Self-pay

## 2017-04-30 ENCOUNTER — Encounter: Payer: Self-pay | Admitting: Oncology

## 2017-04-30 ENCOUNTER — Ambulatory Visit: Payer: Self-pay | Admitting: Oncology

## 2017-04-30 NOTE — Progress Notes (Signed)
No show

## 2017-05-06 DIAGNOSIS — M542 Cervicalgia: Secondary | ICD-10-CM | POA: Diagnosis not present

## 2017-06-07 DIAGNOSIS — F341 Dysthymic disorder: Secondary | ICD-10-CM | POA: Diagnosis not present

## 2017-07-06 DIAGNOSIS — E7800 Pure hypercholesterolemia, unspecified: Secondary | ICD-10-CM | POA: Diagnosis not present

## 2017-07-06 DIAGNOSIS — Z131 Encounter for screening for diabetes mellitus: Secondary | ICD-10-CM | POA: Diagnosis not present

## 2017-07-19 ENCOUNTER — Ambulatory Visit: Payer: Self-pay | Admitting: Oncology

## 2017-07-19 ENCOUNTER — Other Ambulatory Visit: Payer: Self-pay

## 2017-07-20 ENCOUNTER — Other Ambulatory Visit (HOSPITAL_BASED_OUTPATIENT_CLINIC_OR_DEPARTMENT_OTHER): Payer: BLUE CROSS/BLUE SHIELD

## 2017-07-20 ENCOUNTER — Telehealth: Payer: Self-pay | Admitting: Oncology

## 2017-07-20 ENCOUNTER — Ambulatory Visit (HOSPITAL_BASED_OUTPATIENT_CLINIC_OR_DEPARTMENT_OTHER): Payer: BLUE CROSS/BLUE SHIELD | Admitting: Oncology

## 2017-07-20 VITALS — BP 111/74 | HR 88 | Temp 98.0°F | Resp 16 | Ht 65.0 in | Wt 168.0 lb

## 2017-07-20 DIAGNOSIS — Z79811 Long term (current) use of aromatase inhibitors: Secondary | ICD-10-CM | POA: Diagnosis not present

## 2017-07-20 DIAGNOSIS — Z170 Estrogen receptor positive status [ER+]: Secondary | ICD-10-CM

## 2017-07-20 DIAGNOSIS — C50412 Malignant neoplasm of upper-outer quadrant of left female breast: Secondary | ICD-10-CM

## 2017-07-20 LAB — CBC WITH DIFFERENTIAL/PLATELET
BASO%: 0.8 % (ref 0.0–2.0)
Basophils Absolute: 0 10*3/uL (ref 0.0–0.1)
EOS ABS: 0.1 10*3/uL (ref 0.0–0.5)
EOS%: 2.6 % (ref 0.0–7.0)
HCT: 45 % (ref 34.8–46.6)
HGB: 14.7 g/dL (ref 11.6–15.9)
LYMPH%: 21.4 % (ref 14.0–49.7)
MCH: 28.1 pg (ref 25.1–34.0)
MCHC: 32.7 g/dL (ref 31.5–36.0)
MCV: 85.7 fL (ref 79.5–101.0)
MONO#: 0.4 10*3/uL (ref 0.1–0.9)
MONO%: 8.9 % (ref 0.0–14.0)
NEUT%: 66.3 % (ref 38.4–76.8)
NEUTROS ABS: 3.2 10*3/uL (ref 1.5–6.5)
Platelets: 256 10*3/uL (ref 145–400)
RBC: 5.25 10*6/uL (ref 3.70–5.45)
RDW: 13.9 % (ref 11.2–14.5)
WBC: 4.7 10*3/uL (ref 3.9–10.3)
lymph#: 1 10*3/uL (ref 0.9–3.3)

## 2017-07-20 LAB — COMPREHENSIVE METABOLIC PANEL
ALT: 16 U/L (ref 0–55)
AST: 17 U/L (ref 5–34)
Albumin: 3.9 g/dL (ref 3.5–5.0)
Alkaline Phosphatase: 66 U/L (ref 40–150)
Anion Gap: 8 mEq/L (ref 3–11)
BUN: 13.4 mg/dL (ref 7.0–26.0)
CALCIUM: 9 mg/dL (ref 8.4–10.4)
CHLORIDE: 105 meq/L (ref 98–109)
CO2: 28 meq/L (ref 22–29)
Creatinine: 0.8 mg/dL (ref 0.6–1.1)
EGFR: >60 mL/min/{1.73_m2} (ref >60)
Glucose: 79 mg/dl (ref 70–140)
POTASSIUM: 3.7 meq/L (ref 3.5–5.1)
SODIUM: 142 meq/L (ref 136–145)
Total Bilirubin: 0.36 mg/dL (ref 0.20–1.20)
Total Protein: 7 g/dL (ref 6.4–8.3)

## 2017-07-20 NOTE — Progress Notes (Signed)
ID: Erin Castillo   DOB: 1966-03-29  MR#: [id number]  YIR#:[id number]  PCP: Anastasia Fiedler MD JKK:[id number], EVELYN, MD SU:  OTHER MD: Acquanetta Sit MD  CHIEF COMPLAINT: Estrogen receptor positive breast cancer  CURRENT TREATMENT: Anastrozole  BREAST CANCER HISTORY:  from the original intake note:  The patient noticed a dimple in the lateral left breast and was able to palpate a mass. She waited a couple of months since shewas still premenopausal, but the mass did not resolve. She brought it to the attention of Dr. Simona Huh and underwent bilateral diagnostic mammography and left ultrasonography at [facility] on 04/20/2011. Compression views showed a spiculated mass in the upper outer quadrant of the left breast measuring 2.1 cm. By ultrasound, this was a lobulated, irregular, and hypoechoic mass measuring 1.8 cm. No abnormal appearing lymph nodes in the axilla.  Biopsy was performed on 04/20/2011 [id number]) showing an invasive ductal carcinoma which on the pathology report was described as grade 2. The tumor was ER positive at 98%, PR positive at 100%, HER-2/neu negative with a CISH ratio of 1.42, and an MIB-1 of 9%.  The patient underwent bilateral breast MRI on 10-12, confirming a 2.3 cm irregularly marginated, enhancing mass, deep in the upper outer quadrant of the left breast. This was close to the underlying pectoralis major, but without definite extension into the muscle. No other suspicious areas in either breast, and no abnormal appearing lymph nodes were noted. An incidental 1.1 cm anterior liver cyst was noted.  The patient underwentt left lumpectomy and sentinel lymph node sampling on 05/15/2011 under Dr. Excell Seltzer. 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 No evidence of lymphovascular invasion. One of one sentinel lymph nodes was involved.  An Oncotype DX had already been requested, since initially the patient appeared to be node  negative (NCCN guidelines do not recommend this test in node-positive patients). This showed the patient to be on the low risk side. She is known to be BRCA 1 and 2 negative. Her subsequent history is as detailed below.  INTERVAL HISTORY: Mekhia returns today for follow-up of her node-positive estrogen receptor positive breast cancer. She continues on anastrozole, with good tolerance. She notes that she has occasional hot flashes that aren't bad. She denies issues with vaginal dryness. She notes that she doesn't pay much for the medication.   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 She notes that even though she misses the students at the school, she is glad that the stress level is decreased. She notes that her family is doing well. Her son is now in college, and her twins are now applying for college. She reports that she walks occasionally, but other than that she notes that she hasn't exercised very much lately. She denies unusual headaches, visual changes, nausea, vomiting, or dizziness. There has been no unusual cough, phlegm production, or pleurisy. This been no change in bowel or bladder habits. She denies unexplained fatigue or unexplained weight loss, bleeding, rash, or fever. A detailed review of systems was otherwise stable.    PAST MEDICAL HISTORY: Past Medical History:  Diagnosis Date  . Anxiety   . Breast cancer 04/20/11   Left, ER/PR+  . Depression   . History of radiation therapy    09/18/11 to 11/03/11, left breast  . Migraines  PAST SURGICAL HISTORY: Past Surgical History:  Procedure Laterality Date  . BREAST LUMPECTOMY  05/15/11   left breast lumpectomy   . CESAREAN SECTION  2000  . MASTECTOMY PARTIAL / LUMPECTOMY W/ AXILLARY LYMPHADENECTOMY  10/12   snbx  . NASAL SINUS SURGERY  1991  . PORT-A-CATH REMOVAL  12/12/2011    Procedure: REMOVAL PORT-A-CATH;  Surgeon: Edward Jolly, MD;  [facility]: [facility];  Service: General;  Laterality: Right;  . PORTACATH PLACEMENT  06/12/2011   Procedure: INSERTION PORT-A-CATH;  Surgeon: Edward Jolly, MD;  Location: [facility];  Service: General;  Laterality: Right;  Right Subclavian Vein  . SINUS EXPLORATION     remote    FAMILY HISTORY Family History  Problem Relation Age of Onset  . Cancer Father        bladder cancer  . Diabetes Father   The patient's father is alive at age 98.  The patient's mother is alive at age 36.  The patient's father did have bladder cancer but has done well with that.  She has 3 sisters, no brothers.  There is no history of breast or ovarian cancer in the family.  GYNECOLOGIC HISTORY: She had menarche at age 50. The patient is GX, P3. She understands that the fact that she did not deliver a child before the age of 5 does double her risk of breast cancer developing. She stopped having periods after her first chemotherapy, November of 2012.    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 The patient is divorced. Her former husband, Seth Bake, works at [employer]. Children are Rodman Key, now a Museum/gallery exhibitions officer at the [employer]; and twins Chrys Racer and Remo Lipps, currently Seniors in high school. Remo Lipps is interested in [facility] and Chrys Racer possibly in medicine.The patient attends [employer].   ADVANCED DIRECTIVES: Not in place, but the patient tells me (09/02/2015) she intends to [facility] of her sisters as her healthcare power of attorney; the appropriate forms were giving her to complete and notarize at her discretion.  HEALTH MAINTENANCE: Social History   Tobacco Use  . Smoking status: Never Smoker  . Smokeless tobacco: Never Used  Substance Use Topics  . Alcohol use: Yes    Alcohol/week: 0.0 oz    Types: 2 - 3 Glasses of wine per week     Comment: occassionally  . Drug use: No     Colonoscopy:  Sept 2013, Dr. Penelope Coop  PAP:  Bone density: Due September 2018  Lipid panel:  No Known Allergies  Current Outpatient Medications  Medication Sig Dispense Refill  . anastrozole (ARIMIDEX) 1 MG tablet Take 1 tablet (1 mg total) by mouth daily. 90 tablet 4  . b complex vitamins tablet Take 1 tablet by mouth daily.     . mesalamine (LIALDA) 1.2 G EC tablet Take 1,200 mg by mouth daily with breakfast.    . Multiple Vitamin (MULITIVITAMIN WITH MINERALS) TABS Take 1 tablet by mouth daily.    Marland Kitchen venlafaxine XR (EFFEXOR-XR) 37.5 MG 24 hr capsule TAKE 2 CAPSULES BY MOUTH ONCE DAILY 60 capsule 3   No current facility-administered medications for this visit.     OBJECTIVE: Middle-aged white woman who appears well  Vitals:   07/20/17 0944  BP: 111/74  Pulse: 88  Resp: 16  Temp: 98 F (36.7 C)  SpO2: 100%     Body mass index is 27.96 kg/m.    ECOG  FS: 0  Sclerae unicteric, pupils round and equal Oropharynx clear and moist No cervical or supraclavicular adenopathy Lungs no rales or rhonchi Heart regular rate and rhythm Abd soft, nontender, positive bowel sounds MSK no focal spinal tenderness, no upper extremity lymphedema Neuro: nonfocal, well oriented, appropriate affect Breasts: The right breast is unremarkable.  The left breast is status post lumpectomy and radiation.  There is no evidence of local recurrence.  Both axillae are benign.  LAB RESULTS: Lab Results  Component Value Date   WBC 4.7 07/20/2017   NEUTROABS 3.2 07/20/2017   HGB 14.7 07/20/2017   HCT 45.0 07/20/2017   MCV 85.7 07/20/2017   PLT 256 07/20/2017       Chemistry      Component Value Date/Time   NA 141 09/07/2016 1346   K 3.6 09/07/2016 1346   CL 105 09/09/2012 1025   CO2 29 09/07/2016 1346   BUN 14.2 09/07/2016 1346   CREATININE 0.7 09/07/2016 1346      Component Value Date/Time   CALCIUM 9.6 09/07/2016 1346   ALKPHOS 60 09/07/2016 1346    AST 18 09/07/2016 1346   ALT 15 09/07/2016 1346   BILITOT 0.46 09/07/2016 1346       Lab Results  Component Value Date   LABCA2 22 11/28/2011     STUDIES: She was supposed to have had her mammography and bone density at [facility] in September but because of the change in jobs she was not able to get that done.  ASSESSMENT: 52 y.o. BRCA negative [place] woman   (1) Status post left lumpectomy and sentinel lymph node biopsy October of 2012 for a T2 N1, stage IIB invasive ductal carcinoma, grade 2,  strongly estrogen and progesterone receptor positive, with an MIB-1 of 9% and no HER-2/neu amplification.   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  The plan will be to continue that for a total of 2 years after which she will "graduate".  She is behind on her mammography.  She also needs a bone density.  I have scheduled both those at [facility] in April.  She will then see me again in May.  If all goes well she will see me then again in May 2020" graduate" at that time.  She tells me her daughter is interested in a career in the health field and could she possibly do a little shadowing here.  That will be possible if she goes through the volunteer program and I gave her information on how to get back.  Otherwise Remo Lipps will call for any problems that may develop before the next visit.  Magrinat, Virgie Dad, MD  07/20/17 10:04 AM Medical Oncology and  Hematology [facility] [address] [place], [place] [address] Tel. [phone]    Fax. [phone]  This document serves as a record of services personally performed by Lurline Del, MD. It was created on his behalf by Sheron Nightingale, a trained medical scribe. The creation of this record is based on  the scribe's personal observations and the provider's statements to them.   I have reviewed the above documentation for accuracy and completeness, and I agree with the above.

## 2017-07-20 NOTE — Telephone Encounter (Signed)
Patient declined AVs and calendar of upcoming May 2019 appointments. Patient scheduled per 12/28 los.

## 2017-08-14 ENCOUNTER — Encounter: Payer: Self-pay | Admitting: Oncology

## 2017-08-14 DIAGNOSIS — Z853 Personal history of malignant neoplasm of breast: Secondary | ICD-10-CM | POA: Diagnosis not present

## 2017-08-14 DIAGNOSIS — Z780 Asymptomatic menopausal state: Secondary | ICD-10-CM | POA: Diagnosis not present

## 2017-08-14 DIAGNOSIS — Z1231 Encounter for screening mammogram for malignant neoplasm of breast: Secondary | ICD-10-CM | POA: Diagnosis not present

## 2017-08-14 DIAGNOSIS — M8589 Other specified disorders of bone density and structure, multiple sites: Secondary | ICD-10-CM | POA: Diagnosis not present

## 2017-08-15 DIAGNOSIS — J189 Pneumonia, unspecified organism: Secondary | ICD-10-CM | POA: Diagnosis not present

## 2017-10-29 ENCOUNTER — Other Ambulatory Visit: Payer: Self-pay | Admitting: Obstetrics and Gynecology

## 2017-10-29 ENCOUNTER — Other Ambulatory Visit (HOSPITAL_COMMUNITY)
Admission: RE | Admit: 2017-10-29 | Discharge: 2017-10-29 | Disposition: A | Discharge status: Home / Self Care | Payer: BLUE CROSS/BLUE SHIELD | Source: Ambulatory Visit | Attending: Obstetrics and Gynecology | Admitting: Obstetrics and Gynecology

## 2017-10-29 DIAGNOSIS — Z01411 Encounter for gynecological examination (general) (routine) with abnormal findings: Secondary | ICD-10-CM | POA: Insufficient documentation

## 2017-10-29 DIAGNOSIS — M85861 Other specified disorders of bone density and structure, right lower leg: Secondary | ICD-10-CM | POA: Diagnosis not present

## 2017-10-29 DIAGNOSIS — M85862 Other specified disorders of bone density and structure, left lower leg: Secondary | ICD-10-CM | POA: Diagnosis not present

## 2017-10-31 LAB — CYTOLOGY - PAP
Diagnosis: NEGATIVE
HPV: NOT DETECTED

## 2017-12-13 ENCOUNTER — Other Ambulatory Visit: Payer: Self-pay

## 2017-12-13 ENCOUNTER — Ambulatory Visit: Payer: Self-pay | Admitting: Oncology

## 2018-03-28 ENCOUNTER — Telehealth: Payer: Self-pay

## 2018-03-28 NOTE — Telephone Encounter (Signed)
Spoke with patient concerning her upcoming appointment per 9/5 phone msg return. Sent patient a letter with a calender enclosed.

## 2018-05-29 ENCOUNTER — Other Ambulatory Visit: Payer: Self-pay | Admitting: *Deleted

## 2018-05-29 DIAGNOSIS — Z170 Estrogen receptor positive status [ER+]: Principal | ICD-10-CM

## 2018-05-29 DIAGNOSIS — C50412 Malignant neoplasm of upper-outer quadrant of left female breast: Secondary | ICD-10-CM

## 2018-05-29 NOTE — Progress Notes (Signed)
ID: Erin Castillo   DOB: Aug 25, 1965  MR#: [id number]  EXH#:[id number]  PCP: Anastasia Fiedler MD FYB:[id number], EVELYN, MD SU:  OTHER MD: Acquanetta Sit MD  CHIEF COMPLAINT: Estrogen receptor positive breast cancer  CURRENT TREATMENT: Completing 7 years of antiestrogens  BREAST CANCER HISTORY:  from the original intake note:  The patient noticed a dimple in the lateral left breast and was able to palpate a mass. She waited a couple of months since shewas still premenopausal, but the mass did not resolve. She brought it to the attention of Dr. Simona Huh and underwent bilateral diagnostic mammography and left ultrasonography at [facility] on 04/20/2011. Compression views showed a spiculated mass in the upper outer quadrant of the left breast measuring 2.1 cm. By ultrasound, this was a lobulated, irregular, and hypoechoic mass measuring 1.8 cm. No abnormal appearing lymph nodes in the axilla.  Biopsy was performed on 04/20/2011 [phone]) showing an invasive ductal carcinoma which on the pathology report was described as grade 2. The tumor was ER positive at 98%, PR positive at 100%, HER-2/neu negative with a CISH ratio of 1.42, and an MIB-1 of 9%.  The patient underwent bilateral breast MRI on 10-12, confirming a 2.3 cm irregularly marginated, enhancing mass, deep in the upper outer quadrant of the left breast. This was close to the underlying pectoralis major, but without definite extension into the muscle. No other suspicious areas in either breast, and no abnormal appearing lymph nodes were noted. An incidental 1.1 cm anterior liver cyst was noted.  The patient underwentt left lumpectomy and sentinel lymph node sampling on 05/15/2011 under Dr. Excell Seltzer. 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 No evidence of lymphovascular invasion. One of one sentinel lymph nodes was involved.  An Oncotype DX had already been requested, since initially the  patient appeared to be node negative (NCCN guidelines do not recommend this test in node-positive patients). This showed the patient to be on the low risk side. She is known to be BRCA 1 and 2 negative. Her subsequent history is as detailed below.  INTERVAL HISTORY: Erin Castillo returns today for follow-up of her node-positive estrogen receptor positive breast cancer. She is accompanied by her significant other, Dominica Severin. She continues on anastrozole, with good tolerance. She has hot flashes but denies vaginal dryness. She continues on venlafaxine with mild relief of her hot flashes.  Since her last visit to the office, she underwent a DEXA scan on 08/14/2017 that showed: T-score of -1.7.  Mammography on the same day showed no evidence of disease, per report   REVIEW OF SYSTEMS: Erin Castillo reports that for exercise, she walks, however, she would like to walk more. She left her job September, 2019 and she is currently looking for a part time position. She denies unusual headaches, visual changes, nausea, vomiting, or dizziness. There has been no unusual cough, phlegm production, or pleurisy. This been no change in bowel or bladder habits. She denies unexplained fatigue or unexplained weight loss, bleeding, rash, or fever. A detailed review of systems was otherwise stable.      PAST MEDICAL HISTORY: Past Medical History:  Diagnosis Date  . Anxiety   . Breast cancer 04/20/11   Left, ER/PR+  . Depression   . History of radiation therapy    09/18/11 to 11/03/11, left breast  . Migraines     PAST SURGICAL HISTORY: Past Surgical History:  Procedure Laterality Date  . BREAST LUMPECTOMY  05/15/11   left breast  lumpectomy   . CESAREAN SECTION  2000  . MASTECTOMY PARTIAL / LUMPECTOMY W/ AXILLARY LYMPHADENECTOMY  10/12   snbx  . NASAL SINUS SURGERY  1991  . PORT-A-CATH REMOVAL  12/12/2011   Procedure: REMOVAL PORT-A-CATH;  Surgeon: Edward Jolly, MD;  Location: [facility];  Service: General;  Laterality: Right;   . PORTACATH PLACEMENT  06/12/2011   Procedure: INSERTION PORT-A-CATH;  Surgeon: Edward Jolly, MD;  Location: [place];  Service: General;  Laterality: Right;  Right Subclavian Vein  . SINUS EXPLORATION     remote    FAMILY HISTORY Family History  Problem Relation Age of Onset  . Cancer Father        bladder cancer  . Diabetes Father   The patient's father is alive at age 34.  The patient's mother is alive at age 52.  The patient's father did have bladder cancer but has done well with that.  She has 3 sisters, no brothers.  There is no history of breast or ovarian cancer in the family.  GYNECOLOGIC HISTORY: She had menarche at age 55. The patient is GX, P3. She understands that the fact that she did not deliver a child before the age of 38 does double her risk of breast cancer developing. She stopped having periods after her first chemotherapy, November of 2012.    SOCIAL HISTORY: (Updated November 2019) She resigned from her job September 2019 because of stress.. The patient is divorced. Her former husband, Seth Bake, works at [employer]. Children are Rodman Key, now a near at the [place]; and twins Chrys Racer who is in [employer] and [place], currently at [employer] with a career in the [place] had of him.  The patient attends [employer].   ADVANCED DIRECTIVES: Not in place, but the patient tells me (09/02/2015) she intends to [facility] of her sisters as her healthcare power of attorney; she has the appropriate forms to complete and notarize at her discretion.  HEALTH MAINTENANCE: Social History   Tobacco Use  . Smoking status: Never Smoker  . Smokeless tobacco: Never Used  Substance Use Topics  . Alcohol use: Yes    Alcohol/week: 2.0 - 3.0 standard drinks    Types: 2 - 3 Glasses of wine per week    Comment: occassionally  . Drug use: No     Colonoscopy:  Sept 2013, Dr. Penelope Coop  PAP:  Bone density: Due  September 2018  Lipid panel:  No Known Allergies  Current Outpatient Medications  Medication Sig Dispense Refill  . anastrozole (ARIMIDEX) 1 MG tablet Take 1 tablet (1 mg total) by mouth daily. 90 tablet 4  . b complex vitamins tablet Take 1 tablet by mouth daily.     . cholecalciferol (VITAMIN D3) 25 MCG (1000 UT) tablet Take 1 tablet (1,000 Units total) by mouth daily. 90 tablet 4  . mesalamine (LIALDA) 1.2 G EC tablet Take 1,200 mg by mouth daily with breakfast.    . Multiple Vitamin (MULITIVITAMIN WITH MINERALS) TABS Take 1 tablet by mouth daily.    Marland Kitchen venlafaxine XR (EFFEXOR-XR) 37.5 MG 24 hr capsule Take 2 capsules (75 mg total) by mouth daily. 60 capsule 3   No current facility-administered medications for this visit.     OBJECTIVE: Middle-aged white woman no acute distress  Vitals:   05/30/18 1400  BP: 115/87  Pulse: 97  Resp: 18  Temp: 98.3 F (36.8 C)  SpO2: 100%  Body mass index is 28.56 kg/m.    ECOG FS: 0  Sclerae unicteric, EOMs intact No cervical or supraclavicular adenopathy Lungs no rales or rhonchi Heart regular rate and rhythm Abd soft, nontender, positive bowel sounds MSK no focal spinal tenderness, no upper extremity lymphedema Neuro: nonfocal, well oriented, appropriate affect Breasts: Right breast is benign.  The left breast status post lumpectomy followed by radiation.  There is no evidence of local recurrence.  Both axillae are benign.  LAB RESULTS: Lab Results  Component Value Date   WBC 7.5 05/30/2018   NEUTROABS 5.0 05/30/2018   HGB 14.2 05/30/2018   HCT 44.6 05/30/2018   MCV 86.9 05/30/2018   PLT 354 05/30/2018       Chemistry      Component Value Date/Time   NA 140 05/30/2018 1349   NA 142 07/20/2017 0930   K 4.1 05/30/2018 1349   K 3.7 07/20/2017 0930   CL 105 05/30/2018 1349   CL 105 09/09/2012 1025   CO2 28 05/30/2018 1349   CO2 28 07/20/2017 0930   BUN 15 05/30/2018 1349   BUN 13.4 07/20/2017 0930   CREATININE 0.74  05/30/2018 1349   CREATININE 0.8 07/20/2017 0930      Component Value Date/Time   CALCIUM 9.5 05/30/2018 1349   CALCIUM 9.0 07/20/2017 0930   ALKPHOS 88 05/30/2018 1349   ALKPHOS 66 07/20/2017 0930   AST 21 05/30/2018 1349   AST 17 07/20/2017 0930   ALT 25 05/30/2018 1349   ALT 16 07/20/2017 0930   BILITOT <0.2 (L) 05/30/2018 1349   BILITOT 0.36 07/20/2017 0930       Lab Results  Component Value Date   LABCA2 22 11/28/2011     STUDIES: No results found.   ASSESSMENT: 52 y.o. BRCA negative [place] woman   (1) Status post left lumpectomy and sentinel lymph node biopsy October of 2012 for a T2 N1, stage IIB invasive ductal carcinoma, grade 2,  strongly estrogen and progesterone receptor positive, with an MIB-1 of 9% and no HER-2/neu amplification.   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  She is tolerating anastrozole remarkably well.  She will continue it through February 2020 and then stop.  We discussed her bone density which shows osteopenia.  I recommended she walk 45 minutes daily and that she start vitamin D supplementation at 1000 units a day.  For her hot flashes she is going to try to double up on the venlafaxine to 75 mg daily.  If she finds that this works for her she will let us know and we will call the larger dose  for her.  When she goes off the anastrozole the hot flashes may improve sufficiently that she may not be the venlafaxine.  If she wishes to get off that medication she cannot simply stop but will have to taper it off.  She  will call us and we will give her instructions on how to do that  Otherwise I am comfortable releasing her to her primary care physician at this point.  All she will need in terms of breast cancer follow-up is a yearly physician breast exam and yearly mammography.  I will be glad to see Erin Castillo at any point in the future if and when the need arises but as of now are making no further routine appointment for her here.   Erin Castillo, Virgie Dad, MD  05/30/18 2:35 PM Medical Oncology and Hematology [facility] [address] [place], [place] [address] Tel. [phone]    Fax. [phone]    I, Soijett Blue am acting as scribe for Dr. Sarajane Jews C. Zebediah Beezley. I, Lurline Del MD, have reviewed the above documentation for accuracy and completeness, and I agree with the above.

## 2018-05-30 ENCOUNTER — Inpatient Hospital Stay: Payer: BLUE CROSS/BLUE SHIELD | Attending: Oncology

## 2018-05-30 ENCOUNTER — Inpatient Hospital Stay (HOSPITAL_BASED_OUTPATIENT_CLINIC_OR_DEPARTMENT_OTHER): Payer: BLUE CROSS/BLUE SHIELD | Admitting: Oncology

## 2018-05-30 VITALS — BP 115/87 | HR 97 | Temp 98.3°F | Resp 18 | Ht 65.0 in | Wt 171.6 lb

## 2018-05-30 DIAGNOSIS — Z923 Personal history of irradiation: Secondary | ICD-10-CM

## 2018-05-30 DIAGNOSIS — C50412 Malignant neoplasm of upper-outer quadrant of left female breast: Secondary | ICD-10-CM | POA: Insufficient documentation

## 2018-05-30 DIAGNOSIS — Z170 Estrogen receptor positive status [ER+]: Secondary | ICD-10-CM | POA: Diagnosis not present

## 2018-05-30 DIAGNOSIS — Z79810 Long term (current) use of selective estrogen receptor modulators (SERMs): Secondary | ICD-10-CM

## 2018-05-30 DIAGNOSIS — Z79899 Other long term (current) drug therapy: Secondary | ICD-10-CM

## 2018-05-30 LAB — CMP (CANCER CENTER ONLY)
ALT: 25 U/L (ref 0–44)
AST: 21 U/L (ref 15–41)
Albumin: 4 g/dL (ref 3.5–5.0)
Alkaline Phosphatase: 88 U/L (ref 38–126)
Anion gap: 7 (ref 5–15)
BUN: 15 mg/dL (ref 6–20)
CHLORIDE: 105 mmol/L (ref 98–111)
CO2: 28 mmol/L (ref 22–32)
CREATININE: 0.74 mg/dL (ref 0.44–1.00)
Calcium: 9.5 mg/dL (ref 8.9–10.3)
Glucose, Bld: 97 mg/dL (ref 70–99)
POTASSIUM: 4.1 mmol/L (ref 3.5–5.1)
Sodium: 140 mmol/L (ref 135–145)
Total Bilirubin: <0.2 mg/dL — ABNORMAL LOW (ref 0.3–1.2)
Total Protein: 7.4 g/dL (ref 6.5–8.1)

## 2018-05-30 LAB — CBC WITH DIFFERENTIAL (CANCER CENTER ONLY)
Abs Immature Granulocytes: 0.02 10*3/uL (ref 0.00–0.07)
BASOS ABS: 0 10*3/uL (ref 0.0–0.1)
Basophils Relative: 0 %
Eosinophils Absolute: 0.1 10*3/uL (ref 0.0–0.5)
Eosinophils Relative: 1 %
HCT: 44.6 % (ref 36.0–46.0)
HEMOGLOBIN: 14.2 g/dL (ref 12.0–15.0)
IMMATURE GRANULOCYTES: 0 %
LYMPHS PCT: 23 %
Lymphs Abs: 1.7 10*3/uL (ref 0.7–4.0)
MCH: 27.7 pg (ref 26.0–34.0)
MCHC: 31.8 g/dL (ref 30.0–36.0)
MCV: 86.9 fL (ref 80.0–100.0)
Monocytes Absolute: 0.7 10*3/uL (ref 0.1–1.0)
Monocytes Relative: 9 %
NEUTROS ABS: 5 10*3/uL (ref 1.7–7.7)
NRBC: 0 % (ref 0.0–0.2)
Neutrophils Relative %: 67 %
Platelet Count: 354 10*3/uL (ref 150–400)
RBC: 5.13 MIL/uL — AB (ref 3.87–5.11)
RDW: 13.8 % (ref 11.5–15.5)
WBC: 7.5 10*3/uL (ref 4.0–10.5)

## 2018-05-30 MED ORDER — VITAMIN D 25 MCG (1000 UNIT) PO TABS: 1000.0000 [IU] | ORAL_TABLET | Freq: Every day | ORAL | 4 refills | Status: DC

## 2018-05-30 MED ORDER — ANASTROZOLE 1 MG PO TABS: 1.0000 mg | ORAL_TABLET | Freq: Every day | ORAL | 4 refills | Status: DC

## 2018-05-30 MED ORDER — VENLAFAXINE HCL ER 37.5 MG PO CP24: 75.0000 mg | ORAL_CAPSULE | Freq: Every day | ORAL | 3 refills | Status: DC

## 2018-06-12 DIAGNOSIS — K5190 Ulcerative colitis, unspecified, without complications: Secondary | ICD-10-CM | POA: Diagnosis not present

## 2018-07-01 ENCOUNTER — Other Ambulatory Visit: Payer: Self-pay | Admitting: Oncology

## 2018-08-15 ENCOUNTER — Encounter: Payer: Self-pay | Admitting: Oncology

## 2018-08-15 DIAGNOSIS — Z1231 Encounter for screening mammogram for malignant neoplasm of breast: Secondary | ICD-10-CM | POA: Diagnosis not present

## 2018-08-15 DIAGNOSIS — Z853 Personal history of malignant neoplasm of breast: Secondary | ICD-10-CM | POA: Diagnosis not present

## 2018-09-17 DIAGNOSIS — F411 Generalized anxiety disorder: Secondary | ICD-10-CM | POA: Diagnosis not present

## 2019-02-16 DIAGNOSIS — Z20828 Contact with and (suspected) exposure to other viral communicable diseases: Secondary | ICD-10-CM | POA: Diagnosis not present

## 2019-03-19 DIAGNOSIS — Z01419 Encounter for gynecological examination (general) (routine) without abnormal findings: Secondary | ICD-10-CM | POA: Diagnosis not present

## 2019-03-19 DIAGNOSIS — Z113 Encounter for screening for infections with a predominantly sexual mode of transmission: Secondary | ICD-10-CM | POA: Diagnosis not present

## 2019-03-22 ENCOUNTER — Other Ambulatory Visit: Payer: Self-pay | Admitting: Oncology

## 2019-03-27 DIAGNOSIS — N941 Unspecified dyspareunia: Secondary | ICD-10-CM | POA: Diagnosis not present

## 2020-04-07 ENCOUNTER — Other Ambulatory Visit: Payer: Self-pay | Admitting: Oncology

## 2020-05-17 ENCOUNTER — Other Ambulatory Visit: Payer: Self-pay

## 2020-05-17 ENCOUNTER — Telehealth: Payer: Self-pay

## 2020-05-17 DIAGNOSIS — C50412 Malignant neoplasm of upper-outer quadrant of left female breast: Secondary | ICD-10-CM

## 2020-05-17 DIAGNOSIS — Z170 Estrogen receptor positive status [ER+]: Secondary | ICD-10-CM

## 2020-05-17 NOTE — Telephone Encounter (Signed)
Patient palpated new lump/nodule to left breast.  Pt with history of breast cancer, left.   Pt denies any redness, drainage, or open are to left breast.  Nodule felt to left breast.    Per MD recommendations obtain left breast diagnostic mammogram.    Pt notified and verbalized understanding and agreement.  [facility] order faxed successfully.

## 2020-05-25 ENCOUNTER — Encounter: Payer: Self-pay | Admitting: Oncology

## 2020-07-26 ENCOUNTER — Encounter: Payer: Self-pay | Admitting: Oncology

## 2020-09-06 ENCOUNTER — Other Ambulatory Visit: Payer: BLUE CROSS/BLUE SHIELD

## 2022-05-12 NOTE — Progress Notes (Unsigned)
Cardiology Office Note:    Date:  05/12/2022   ID:  Erin Castillo, DOB 1965-08-01, MRN [id number]  PCP:  Thurnell Lose, MD   [place] Providers Cardiologist:  None { Click to update primary MD,subspecialty MD or APP then REFRESH:1}    Referring MD: Thurnell Lose, MD   No chief complaint on file. ***  History of Present Illness:    Erin Castillo is a 56 y.o. female with a hx of ***  Past Medical History:  Diagnosis Date   Anxiety    Breast cancer ([place]) 04/20/11   Left, ER/PR+   Depression    History of radiation therapy    09/18/11 to 11/03/11, left breast   Migraines     Past Surgical History:  Procedure Laterality Date   BREAST LUMPECTOMY  05/15/11   left breast lumpectomy    CESAREAN SECTION  2000   MASTECTOMY PARTIAL / LUMPECTOMY W/ AXILLARY LYMPHADENECTOMY  10/12   snbx   NASAL SINUS SURGERY  1991   PORT-A-CATH REMOVAL  12/12/2011   Procedure: REMOVAL PORT-A-CATH;  Surgeon: Edward Jolly, MD;  [facility];  Service: General;  Laterality: Right;   PORTACATH PLACEMENT  06/12/2011   Procedure: INSERTION PORT-A-CATH;  Surgeon: Edward Jolly, MD;  Location: [place];  Service: General;  Laterality: Right;  Right Subclavian Vein   SINUS EXPLORATION     remote    Current Medications: No outpatient medications have been marked as taking for the 05/15/22 encounter (Appointment) with Freada Bergeron, MD.     Allergies:   Patient has no known allergies.   Social History   Socioeconomic History   Marital status: Married    Spouse name: Not on file   Number of children: Not on file   Years of education: Not on file   Highest education level: Not on file  Occupational History   Occupation: Glass blower/designer  Tobacco Use   Smoking status: Never   Smokeless tobacco: Never  Substance and Sexual Activity   Alcohol use: Yes    Alcohol/week: 2.0 - 3.0 standard drinks of alcohol    Types: 2 - 3 Glasses of wine per week     Comment: occassionally   Drug use: No   Sexual activity: Not on file  Other Topics Concern   Not on file  Social History Narrative   3 children   Social Determinants of Health   Financial Resource Strain: Not on file  Food Insecurity: Not on file  Transportation Needs: Not on file  Physical Activity: Not on file  Stress: Not on file  Social Connections: Not on file     Family History: The patient's ***family history includes Cancer in her father; Diabetes in her father.  ROS:   Please see the history of present illness.    *** All other systems reviewed and are negative.  EKGs/Labs/Other Studies Reviewed:    The following studies were reviewed today: ***  EKG:  EKG is *** ordered today.  The ekg ordered today demonstrates ***  Recent Labs: No results found for requested labs within last 365 days.  Recent Lipid Panel No results found for: "CHOL", "TRIG", "HDL", "CHOLHDL", "VLDL", "LDLCALC", "LDLDIRECT"   Risk Assessment/Calculations:   {Does this patient have ATRIAL FIBRILLATION?:[id number]}  No BP recorded.  {Refresh Note OR Click here to enter BP  :1}***         Physical Exam:    VS:  There were no vitals taken for  this visit.    Wt Readings from Last 3 Encounters:  05/30/18 171 lb 9.6 oz (77.8 kg)  07/20/17 168 lb (76.2 kg)  09/07/16 155 lb 11.2 oz (70.6 kg)     GEN: *** Well nourished, well developed in no acute distress HEENT: Normal NECK: No JVD; No carotid bruits LYMPHATICS: No lymphadenopathy CARDIAC: ***RRR, no murmurs, rubs, gallops RESPIRATORY:  Clear to auscultation without rales, wheezing or rhonchi  ABDOMEN: Soft, non-tender, non-distended MUSCULOSKELETAL:  No edema; No deformity  SKIN: Warm and dry NEUROLOGIC:  Alert and oriented x 3 PSYCHIATRIC:  Normal affect   ASSESSMENT:    No diagnosis found. PLAN:    In order of problems listed above:  ***      {Are you ordering a CV Procedure (e.g. stress test, cath, DCCV, TEE,  etc)?   Press F2        :[id number]}    Medication Adjustments/Labs and Tests Ordered: Current medicines are reviewed at length with the patient today.  Concerns regarding medicines are outlined above.  No orders of the defined types were placed in this encounter.  No orders of the defined types were placed in this encounter.   There are no Patient Instructions on file for this visit.   Signed, Freada Bergeron, MD  05/12/2022 8:43 AM    [place]

## 2022-05-15 ENCOUNTER — Encounter: Payer: Self-pay | Admitting: Cardiology

## 2022-05-15 ENCOUNTER — Ambulatory Visit: Payer: BLUE CROSS/BLUE SHIELD | Attending: Cardiology | Admitting: Cardiology

## 2022-05-15 VITALS — BP 108/80 | HR 88 | Ht 65.0 in | Wt 156.4 lb

## 2022-05-15 DIAGNOSIS — Z9221 Personal history of antineoplastic chemotherapy: Secondary | ICD-10-CM

## 2022-05-15 DIAGNOSIS — R072 Precordial pain: Secondary | ICD-10-CM | POA: Diagnosis not present

## 2022-05-15 DIAGNOSIS — Z170 Estrogen receptor positive status [ER+]: Secondary | ICD-10-CM

## 2022-05-15 DIAGNOSIS — C50912 Malignant neoplasm of unspecified site of left female breast: Secondary | ICD-10-CM

## 2022-05-15 MED ORDER — METOPROLOL TARTRATE 100 MG PO TABS: 100.0000 mg | ORAL_TABLET | Freq: Once | ORAL | 0 refills | Status: DC

## 2022-05-15 MED ORDER — NITROGLYCERIN 0.4 MG SL SUBL: 0.4000 mg | SUBLINGUAL_TABLET | SUBLINGUAL | 3 refills | Status: AC | PRN

## 2022-05-15 NOTE — Patient Instructions (Signed)
Medication Instructions:    DR. Johney Frame HAS PRESCRIBED YOU 0.4 MG OF SUBLINGUAL (UNDER THE TONGUE) NITROGLYCERIN - PLEASE PLACE ONE TABLET UNDER THE TONGUE EVERY 5 MINS AS NEEDED FOR CHEST PAIN--IF YOU HAVE NO RELIEF AFTER THE 2ND TABLET ADMINISTERED, PLEASE SEEK IMMEDIATE MEDICAL ATTENTION LIKE 911 THEREAFTER. --TAKE NO MORE THAN 3 TABLETS  *If you need a refill on your cardiac medications before your next appointment, please call your pharmacy*    Testing/Procedures:   Your physician has requested that you have an echocardiogram. Echocardiography is a painless test that uses sound waves to create images of your heart. It provides your doctor with information about the size and shape of your heart and how well your heart's chambers and valves are working. This procedure takes approximately one hour. There are no restrictions for this procedure. DO ECHO WITH STRAIN PER DR. Johney Frame    Please do NOT wear cologne, perfume, aftershave, or lotions (deodorant is allowed). Please arrive 15 minutes prior to your appointment time.        Your cardiac CT will be scheduled at one of the below locations:   [facility] [address] [place], [place] [address] [phone]   If scheduled at [facility], please arrive at the [facility] and Children's Entrance (Entrance C2) of [facility] 30 minutes prior to test start time. You can use the FREE valet parking offered at entrance C (encouraged to control the heart rate for the test)  Proceed to the [facility] Radiology Department (first floor) to check-in and test prep.  All radiology patients and guests should use entrance C2 at [facility], accessed from [facility], even though the hospital's physical address listed is [address]     Please follow these instructions carefully (unless otherwise directed):   On the Night Before the Test: Be sure to Drink plenty of  water. Do not consume any caffeinated/decaffeinated beverages or chocolate 12 hours prior to your test. Do not take any antihistamines 12 hours prior to your test.   On the Day of the Test: Drink plenty of water until 1 hour prior to the test. Do not eat any food 1 hour prior to test. You may take your regular medications prior to the test.  Take metoprolol 100 mg by mouth (Lopressor) two hours prior to test. FEMALES- please wear underwire-free bra if available, avoid dresses & tight clothing       After the Test: Drink plenty of water. After receiving IV contrast, you may experience a mild flushed feeling. This is normal. On occasion, you may experience a mild rash up to 24 hours after the test. This is not dangerous. If this occurs, you can take Benadryl 25 mg and increase your fluid intake. If you experience trouble breathing, this can be serious. If it is severe call 911 IMMEDIATELY. If it is mild, please call our office.   We will call to schedule your test 2-4 weeks out understanding that some insurance companies will need an authorization prior to the service being performed.   For non-scheduling related questions, please contact the cardiac imaging nurse navigator should you have any questions/concerns: Marchia Bond, Cardiac Imaging Nurse Navigator Gordy Clement, Cardiac Imaging Nurse Navigator [facility] Direct Office Dial: [phone]   For scheduling needs, including cancellations and rescheduling, please call [place], [phone].    Follow-Up: At [facility], you and your health needs are our priority.  As  part of our continuing mission to provide you with exceptional heart care, we have created designated Provider Care Teams.  These Care Teams include your primary Cardiologist (physician) and Advanced Practice Providers (APPs -  Physician Assistants and Nurse Practitioners) who all work together to provide you with the care you  need, when you need it.  We recommend signing up for the patient portal called "MyChart".  Sign up information is provided on this After Visit Summary.  MyChart is used to connect with patients for Virtual Visits (Telemedicine).  Patients are able to view lab/test results, encounter notes, upcoming appointments, etc.  Non-urgent messages can be sent to your provider as well.   To learn more about what you can do with MyChart, go to [email or web address].    Your next appointment:   6 month(s)  The format for your next appointment:   In Person  Provider:   Robbie Lis, PA-C, Nicholes Rough, PA-C, Melina Copa, PA-C, Ambrose Pancoast, NP, Cecilie Kicks, NP, Ermalinda Barrios, PA-C, Christen Bame, NP, or Richardson Dopp, PA-C

## 2022-05-15 NOTE — Progress Notes (Signed)
Cardiology Office Note:    Date:  05/15/2022   ID:  Erin Castillo, DOB 21-Sep-1965, MRN [id number]  PCP:  Saint[facility] Halsted, [place] Providers Cardiologist:  None {  Referring MD: Thurnell Lose, MD    History of Present Illness:    Erin Castillo is a 56 y.o. female with a hx of anxiety, estrogen positive breast cancer, and migraines who was referred by Dr. Simona Huh for further evaluation of chest pain.  Per review of onc notes. Patient has history of estrogen positive breast cancer s/p lumpectomy. Treated initially with docetaxel, doxorubicin, and cyclophosphamide for one cycle, with very poor tolerance. Then received cyclophosphamide/ docetaxel, for an additional three cycles. The received XRT. Was on tamoxifen that was later changed to anastrozole.   Was seen by Dr. Simona Huh on 05/10/22. Note reviewed. Had episode of chest pain that lasted for 15-83mn while at a work conference. ECG in their office with NSR with no ischemic changes. Given symptoms, she was referred to Cardiology for further evaluation.  Today, she is accompanied by her husband. She states that over the past year she has had 4 total episodes of severe substernal chest pressure. All occurred while at rest or standing at work. Each episode lasted 3-469m before resolving. Symptoms were not triggered by exertion and she is overall fairly active without significant symptoms.   Otherwise, she denies any palpitations, shortness of breath, or peripheral edema. No lightheadedness, headaches, syncope, orthopnea, or PND.  Her father had a silent heart attack 25 years prior to his passing at [age]ears old.   Past Medical History:  Diagnosis Date   Achilles tendonitis    Anxiety    [place]ast [place] ([place]   Left, ER/PR+   Chest pain    Depression    Dyspareunia in female    Dysthymic disorder    GAD (generalized anxiety disorder)    Heel spur, left    History of radiation therapy    09/18/11 to  11/03/11, left breast   Hypercholesterolemia    Hypotension    Insomnia    Migraines    Osteopenia    Postmenopausal atrophic vaginitis    Ulcerative colitis ([facility]    Past Surgical History:  Procedure Laterality Date   BREAST LUMPECTOMY  05/15/11   left breast lumpectomy    CESAREAN SECTION  2000   MASTECTOMY PARTIAL / LUMPECTOMY W/ AXILLARY LYMPHADENECTOMY  10/12   snbx   NASAL SINUS SURGERY  1991   PORT-A-CATH REMOVAL  12/12/2011   Procedure: REMOVAL PORT-A-CATH;  Surgeon: BeEdward JollyMD;  [facility];  Service: General;  Laterality: Right;   PORTACATH PLACEMENT  06/12/2011   Procedure: INSERTION PORT-A-CATH;  Surgeon: BeEdward JollyMD;  [place]: [facility] Service: General;  Laterality: Right;  Right Subclavian Vein   SINUS EXPLORATION     remote    Current Medications: Current Meds  Medication Sig   anastrozole (ARIMIDEX) 1 MG tablet Take 1 tablet (1 mg total) by mouth daily.   b complex vitamins tablet Take 1 tablet by mouth daily.    cyclobenzaprine (FLEXERIL) 5 MG tablet Take 5-10 mg by mouth daily as needed.   mesalamine (LIALDA) 1.2 G EC tablet Take 1,200 mg by mouth daily with breakfast.   metoprolol tartrate (LOPRESSOR) 100 MG tablet Take 1 tablet (100 mg total) by mouth once for 1 dose. Take 90-120 minutes prior to scan.   Multiple Vitamin (MULITIVITAMIN WITH MINERALS)  TABS Take 1 tablet by mouth daily.   nitroGLYCERIN (NITROSTAT) 0.4 MG SL tablet Place 1 tablet (0.4 mg total) under the tongue every 5 (five) minutes as needed for chest pain.   SUMAtriptan (IMITREX) 50 MG tablet Take 50 mg by mouth every 2 (two) hours as needed for migraine. May repeat in 2 hours if headache persists or recurs.   venlafaxine XR (EFFEXOR-XR) 37.5 MG 24 hr capsule TAKE 2 CAPSULES (75 MG TOTAL) BY MOUTH DAILY.     Allergies:   Patient has no known allergies.   Social History   Socioeconomic History   Marital status: Married    Spouse name:  Not on file   Number of children: Not on file   Years of education: Not on file   Highest education level: Not on file  Occupational History   Occupation: Glass blower/designer  Tobacco Use   Smoking status: Never   Smokeless tobacco: Never  Substance and Sexual Activity   Alcohol use: Yes    Alcohol/week: 2.0 - 3.0 standard drinks of alcohol    Types: 2 - 3 Glasses of wine per week    Comment: occassionally   Drug use: No   Sexual activity: Not on file  Other Topics Concern   Not on file  Social History Narrative   3 children   Social Determinants of Health   Financial Resource Strain: Not on file  Food Insecurity: Not on file  Transportation Needs: Not on file  Physical Activity: Not on file  Stress: Not on file  Social Connections: Not on file     Family History: The patient's family history includes Cancer in her father; Diabetes in her father.  ROS:   Please see the history of present illness.    (+) Stress (+) Chest pain (+) Fatigue (+) Nausea  All other systems reviewed and are negative.  EKGs/Labs/Other Studies Reviewed:    The following studies were reviewed today:  TTE 05/2011: Study Conclusions   Left ventricle: The cavity size was normal. Wall thickness  was normal. Systolic function was normal. The estimated  ejection fraction was in the range of 60% to 65%. Left  ventricular diastolic function parameters were normal.         Transthoracic echocardiography.  M-mode, complete 2D,  spectral Doppler, and color Doppler.  Height:  Height:  165.1cm. Height: 65in.  Weight:  Weight: 65.8kg. Weight:  144.7lb.  Body mass index:  BMI: 24.1kg/m^2.  Body surface  area:    BSA: 1.45m2.  Blood pressure:     108/71.  Patient  status:  Outpatient.  Location:  Echo laboratory.    EKG:  EKG is personally reviewed. 05/09/22: Normal sinus rhythm. ([place] [facility] [place]  Recent Labs: No results found for requested labs within last 365 days.  Recent Lipid Panel No results  found for: "CHOL", "TRIG", "HDL", "CHOLHDL", "VLDL", "LDLCALC", "LDLDIRECT"   Risk Assessment/Calculations:                Physical Exam:    VS:  BP 108/80 (BP Location: Left Arm, Patient Position: Sitting, Cuff Size: Normal)   Pulse 88   Ht '5\' 5"'$  (1.651 m)   Wt 156 lb 6.4 oz (70.9 kg)   SpO2 100%   BMI 26.03 kg/m     Wt Readings from Last 3 Encounters:  05/15/22 156 lb 6.4 oz (70.9 kg)  05/30/18 171 lb 9.6 oz (77.8 kg)  07/20/17 168 lb (76.2 kg)     GEN:  Well  nourished, well developed in no acute distress HEENT: Normal NECK: No JVD; No carotid bruits CARDIAC: RRR, no murmurs, rubs, gallops RESPIRATORY:  Clear to auscultation without rales, wheezing or rhonchi  ABDOMEN: Soft, non-tender, non-distended MUSCULOSKELETAL:  No edema; No deformity  SKIN: Warm and dry NEUROLOGIC:  Alert and oriented x 3 PSYCHIATRIC:  Normal affect   ASSESSMENT:    1. Precordial pain   2. History of chemotherapy   3. Malignant neoplasm of left breast in female, estrogen receptor positive, unspecified site of breast ([place])     PLAN:    In order of problems listed above:  #Chest Pain: Patient with 4 episodes of nonexertional chest pressure that occurred over the past 4 years. Symptoms sound more like coronary vasospasm than CAD but given the severity of her symptoms and risk factors, will check coronary CTA and TTE for further evaluation. Will give SL NTG as well to see if helps with symptoms. -Check coronary CTA -Check TTE -Start SL NTG as needed for symptoms  #History of Estrogen Positive Breast Cancer: S/p chemo/R+XRT. TTE in 05/2011 with normal EF, no significant valve disease. -Check TTE with strain          Follow up: 6 months.  Medication Adjustments/Labs and Tests Ordered: Current medicines are reviewed at length with the patient today.  Concerns regarding medicines are outlined above.  Orders Placed This Encounter  Procedures   CT CORONARY MORPH W/CTA COR W/SCORE  W/CA W/CM &/OR WO/CM   ECHOCARDIOGRAM COMPLETE   Meds ordered this encounter  Medications   metoprolol tartrate (LOPRESSOR) 100 MG tablet    Sig: Take 1 tablet (100 mg total) by mouth once for 1 dose. Take 90-120 minutes prior to scan.    Dispense:  1 tablet    Refill:  0   nitroGLYCERIN (NITROSTAT) 0.4 MG SL tablet    Sig: Place 1 tablet (0.4 mg total) under the tongue every 5 (five) minutes as needed for chest pain.    Dispense:  25 tablet    Refill:  3    Patient Instructions  Medication Instructions:    DR. Johney Frame HAS PRESCRIBED YOU 0.4 MG OF SUBLINGUAL (UNDER THE TONGUE) NITROGLYCERIN - PLEASE PLACE ONE TABLET UNDER THE TONGUE EVERY 5 MINS AS NEEDED FOR CHEST PAIN--IF YOU HAVE NO RELIEF AFTER THE 2ND TABLET ADMINISTERED, PLEASE SEEK IMMEDIATE MEDICAL ATTENTION LIKE 911 THEREAFTER. --TAKE NO MORE THAN 3 TABLETS  *If you need a refill on your cardiac medications before your next appointment, please call your pharmacy*    Testing/Procedures:   Your physician has requested that you have an echocardiogram. Echocardiography is a painless test that uses sound waves to create images of your heart. It provides your doctor with information about the size and shape of your heart and how well your heart's chambers and valves are working. This procedure takes approximately one hour. There are no restrictions for this procedure. DO ECHO WITH STRAIN PER DR. Johney Frame    Please do NOT wear cologne, perfume, aftershave, or lotions (deodorant is allowed). Please arrive 15 minutes prior to your appointment time.        Your cardiac CT will be scheduled at one of the below locations:   [facility] [address] [place], [place] [address] [phone]   If scheduled at [facility], please arrive at the [facility] and Children's Entrance (Entrance C2) of [facility] 30 minutes prior to test start time. You can use the FREE valet parking offered  at  entrance C (encouraged to control the heart rate for the test)  Proceed to the [facility] Radiology Department (first floor) to check-in and test prep.  All radiology patients and guests should use entrance C2 at [facility], accessed from [facility], even though the hospital's physical address listed is [address].     Please follow these instructions carefully (unless otherwise directed):   On the Night Before the Test: Be sure to Drink plenty of water. Do not consume any caffeinated/decaffeinated beverages or chocolate 12 hours prior to your test. Do not take any antihistamines 12 hours prior to your test.   On the Day of the Test: Drink plenty of water until 1 hour prior to the test. Do not eat any food 1 hour prior to test. You may take your regular medications prior to the test.  Take metoprolol 100 mg by mouth (Lopressor) two hours prior to test. FEMALES- please wear underwire-free bra if available, avoid dresses & tight clothing       After the Test: Drink plenty of water. After receiving IV contrast, you may experience a mild flushed feeling. This is normal. On occasion, you may experience a mild rash up to 24 hours after the test. This is not dangerous. If this occurs, you can take Benadryl 25 mg and increase your fluid intake. If you experience trouble breathing, this can be serious. If it is severe call 911 IMMEDIATELY. If it is mild, please call our office.   We will call to schedule your test 2-4 weeks out understanding that some insurance companies will need an authorization prior to the service being performed.   For non-scheduling related questions, please contact the cardiac imaging nurse navigator should you have any questions/concerns: Marchia Bond, Cardiac Imaging Nurse Navigator Gordy Clement, Cardiac Imaging Nurse Navigator [facility] Direct Office Dial: [phone]   For scheduling needs,  including cancellations and rescheduling, please call [place], [phone].    Follow-Up: At [facility], you and your health needs are our priority.  As part of our continuing mission to provide you with exceptional heart care, we have created designated Provider Care Teams.  These Care Teams include your primary Cardiologist (physician) and Advanced Practice Providers (APPs -  Physician Assistants and Nurse Practitioners) who all work together to provide you with the care you need, when you need it.  We recommend signing up for the patient portal called "MyChart".  Sign up information is provided on this After Visit Summary.  MyChart is used to connect with patients for Virtual Visits (Telemedicine).  Patients are able to view lab/test results, encounter notes, upcoming appointments, etc.  Non-urgent messages can be sent to your provider as well.   To learn more about what you can do with MyChart, go to [email or web address].    Your next appointment:   6 month(s)  The format for your next appointment:   In Person  Provider:   Robbie Lis, PA-C, Nicholes Rough, PA-C, Melina Copa, PA-C, Ambrose Pancoast, NP, Cecilie Kicks, NP, Ermalinda Barrios, PA-C, Christen Bame, NP, or Richardson Dopp, PA-C                   I,Breanna Adamick,acting as a scribe for Freada Bergeron, MD.,have documented all relevant documentation on the behalf of Freada Bergeron, MD,as directed by  Freada Bergeron, MD while in the presence of Freada Bergeron, MD.  I, Freada Bergeron, MD, have  reviewed all documentation for this visit. The documentation on 05/15/22 for the exam, diagnosis, procedures, and orders are all accurate and complete.   Signed, Freada Bergeron, MD  05/15/2022 3:59 PM    [place]

## 2022-05-18 ENCOUNTER — Telehealth: Payer: Self-pay | Admitting: *Deleted

## 2022-05-18 NOTE — Telephone Encounter (Signed)
-----   Message from Melony Overly sent at 05/18/2022 12:49 PM EDT ----- Regarding: ct heart Scheduled 05/30/22 at 2:30

## 2022-05-29 ENCOUNTER — Other Ambulatory Visit (HOSPITAL_COMMUNITY): Payer: BLUE CROSS/BLUE SHIELD

## 2022-05-29 ENCOUNTER — Telehealth (HOSPITAL_COMMUNITY): Payer: Self-pay | Admitting: *Deleted

## 2022-05-29 ENCOUNTER — Telehealth (HOSPITAL_COMMUNITY): Payer: Self-pay | Admitting: Emergency Medicine

## 2022-05-29 NOTE — Telephone Encounter (Signed)
Attempted to call patient regarding upcoming cardiac CT appointment. °Left message on voicemail with name and callback number °Charina Fons RN Navigator Cardiac Imaging °[facility] °[phone] Office °[phone] Cell ° °

## 2022-05-29 NOTE — Telephone Encounter (Signed)
Reaching out to patient to offer assistance regarding upcoming cardiac imaging study; pt verbalizes understanding of appt date/time, parking situation and where to check in,  medications ordered, and verified current allergies; name and call back number provided for further questions should they arise  Gordy Clement RN Navigator Cardiac Imaging [facility] [phone] office [phone] cell  Patient to take '100mg'$  metoprolol tartrate two hours prior to her cardiac CT scan.  She is aware to arrive at 2pm.

## 2022-05-30 ENCOUNTER — Ambulatory Visit (HOSPITAL_COMMUNITY)
Admission: RE | Admit: 2022-05-30 | Discharge: 2022-05-30 | Disposition: A | Discharge status: Home / Self Care | Payer: Commercial Managed Care - PPO | Source: Ambulatory Visit | Attending: Cardiology | Admitting: Cardiology

## 2022-05-30 DIAGNOSIS — R072 Precordial pain: Secondary | ICD-10-CM | POA: Insufficient documentation

## 2022-05-30 MED ORDER — IOHEXOL 350 MG/ML SOLN
100.0000 mL | Freq: Once | INTRAVENOUS | Status: AC | PRN
  Administered 2022-05-30: 100 mL via INTRAVENOUS

## 2022-05-30 MED ORDER — METOPROLOL TARTRATE 5 MG/5ML IV SOLN
INTRAVENOUS | Status: AC
  Filled 2022-05-30: qty 10

## 2022-05-30 MED ORDER — DILTIAZEM HCL 25 MG/5ML IV SOLN: 5.0000 mg | INTRAVENOUS | Status: DC | PRN

## 2022-05-30 MED ORDER — NITROGLYCERIN 0.4 MG SL SUBL
SUBLINGUAL_TABLET | SUBLINGUAL | Status: AC
  Filled 2022-05-30: qty 2

## 2022-05-30 MED ORDER — DILTIAZEM HCL 25 MG/5ML IV SOLN
INTRAVENOUS | Status: AC
  Filled 2022-05-30: qty 5

## 2022-05-30 MED ORDER — NITROGLYCERIN 0.4 MG SL SUBL
0.8000 mg | SUBLINGUAL_TABLET | Freq: Once | SUBLINGUAL | Status: AC
  Administered 2022-05-30: 0.8 mg via SUBLINGUAL

## 2022-05-30 MED ORDER — METOPROLOL TARTRATE 5 MG/5ML IV SOLN
5.0000 mg | INTRAVENOUS | Status: DC | PRN
  Administered 2022-05-30: 10 mg via INTRAVENOUS

## 2022-06-06 ENCOUNTER — Ambulatory Visit (HOSPITAL_COMMUNITY): Payer: Commercial Managed Care - PPO | Attending: Cardiology

## 2022-06-06 DIAGNOSIS — Z170 Estrogen receptor positive status [ER+]: Secondary | ICD-10-CM | POA: Diagnosis present

## 2022-06-06 DIAGNOSIS — R072 Precordial pain: Secondary | ICD-10-CM

## 2022-06-06 DIAGNOSIS — Z9221 Personal history of antineoplastic chemotherapy: Secondary | ICD-10-CM

## 2022-06-06 DIAGNOSIS — I5030 Unspecified diastolic (congestive) heart failure: Secondary | ICD-10-CM

## 2022-06-06 DIAGNOSIS — C50912 Malignant neoplasm of unspecified site of left female breast: Secondary | ICD-10-CM

## 2022-06-06 LAB — ECHOCARDIOGRAM COMPLETE
Area-P 1/2: 4.08 cm2
S' Lateral: 2.5 cm

## 2022-08-22 ENCOUNTER — Other Ambulatory Visit: Payer: Self-pay | Admitting: Nurse Practitioner

## 2022-08-22 ENCOUNTER — Other Ambulatory Visit (HOSPITAL_COMMUNITY)
Admission: RE | Admit: 2022-08-22 | Discharge: 2022-08-22 | Disposition: A | Discharge status: Home / Self Care | Payer: Commercial Managed Care - PPO | Source: Ambulatory Visit | Attending: Nurse Practitioner | Admitting: Nurse Practitioner

## 2022-08-22 DIAGNOSIS — Z124 Encounter for screening for malignant neoplasm of cervix: Secondary | ICD-10-CM | POA: Insufficient documentation

## 2022-08-25 LAB — CYTOLOGY - PAP
Comment: NEGATIVE
Diagnosis: NEGATIVE
High risk HPV: NEGATIVE

## 2023-12-11 ENCOUNTER — Other Ambulatory Visit: Payer: Self-pay

## 2023-12-12 LAB — SURGICAL PATHOLOGY

## 2023-12-13 ENCOUNTER — Telehealth: Payer: Self-pay | Admitting: *Deleted

## 2023-12-13 NOTE — Telephone Encounter (Signed)
 LVM to patient in reference to upcoming [facility] appointment on 5/28 arrive at 830, left my contact to call back

## 2023-12-13 NOTE — Telephone Encounter (Signed)
 Spoke to patient to confirm upcoming morning [facility] clinic appointment on 5/28, paperwork will be sent via [employer].  Gave location and time, also informed patient that the surgeon's office would be calling as well to get information from them similar to the packet that they will be receiving so make sure to do both.  Reminded patient that all providers will be coming to the clinic to see them HERE and if they had any questions to not hesitate to reach back out to myself or their navigators.

## 2023-12-18 ENCOUNTER — Encounter: Payer: Self-pay | Admitting: Genetic Counselor

## 2023-12-18 ENCOUNTER — Encounter: Payer: Self-pay | Admitting: *Deleted

## 2023-12-18 ENCOUNTER — Telehealth: Payer: Self-pay | Admitting: Genetic Counselor

## 2023-12-18 DIAGNOSIS — Z170 Estrogen receptor positive status [ER+]: Secondary | ICD-10-CM

## 2023-12-18 NOTE — Progress Notes (Incomplete)
 Radiation Oncology         [phone] ________________________________  Name: Erin Castillo        MRN: [id number]  Date of Service: 12/19/2023 DOB: 1966-05-29  YI:RSWNI, Gailen Journey, FNP  [name], MD     REFERRING PHYSICIAN: [name], MD   DIAGNOSIS: There were no encounter diagnoses.   HISTORY OF PRESENT ILLNESS: Erin Castillo is a 58 y.o. female seen in the multidisciplinary breast clinic for a new diagnosis of ***   Of note the patient had a palpable mass in the left breast originally noted in the fall 2012, this measured 2.1 cm by mammogram and 1.8 cm by ultrasound with no evidence of axillary adenopathy, she underwent biopsy showing a grade 2 ER/PR positive invasive ductal carcinoma and underwent lumpectomy with sentinel lymph node biopsy on 05/15/2011 which showed a 2.4 cm invasive ductal carcinoma that was grade 2 with negative margins and 1 sentinel lymph node was positive. She had low risk Oncotype Dx score so she went on to receive radiation as well as antiestrogen therapy which she completed for a total of 7 years.  More recently, she presented with***   A biopsy of her left breast on 12/11/23 at the 11 o'clock position showed a grade 3 invasive ductal carcinoma that was ER/PR positive, HER2 equivocal with a Ki-67 of 30%.  FISH is pending.  She is seen today to discuss treatment recommendations.  PREVIOUS RADIATION THERAPY:   09/18/2011-11/03/2011                                                     Left breast 5040 cGy 28 sessions, left breast boost 1200 cGy 6 sessions treated with Dr. Ike Malady          PAST MEDICAL HISTORY:  Past Medical History:  Diagnosis Date   Achilles tendonitis    Anxiety    Breast cancer (HCC) 04/20/2011   Left, ER/PR+   Chest pain    Depression    Dyspareunia in female    Dysthymic disorder    GAD (generalized anxiety disorder)    Heel spur, left    History of radiation therapy    09/18/11 to 11/03/11, left breast    Hypercholesterolemia    Hypotension    Insomnia    Migraines    Osteopenia    Postmenopausal atrophic vaginitis    Ulcerative colitis (HCC)        PAST SURGICAL HISTORY: Past Surgical History:  Procedure Laterality Date   BREAST LUMPECTOMY  05/15/11   left breast lumpectomy    CESAREAN SECTION  2000   MASTECTOMY PARTIAL / LUMPECTOMY W/ AXILLARY LYMPHADENECTOMY  10/12   snbx   NASAL SINUS SURGERY  1991   PORT-A-CATH REMOVAL  12/12/2011   Procedure: REMOVAL PORT-A-CATH;  Surgeon: Quitman Bucy, MD;  Location: [facility];  Service: General;  Laterality: Right;   PORTACATH PLACEMENT  06/12/2011   Procedure: INSERTION PORT-A-CATH;  Surgeon: Quitman Bucy, MD;  Location: [facility];  Service: General;  Laterality: Right;  Right Subclavian Vein   SINUS EXPLORATION     remote     FAMILY HISTORY:  Family History  Problem Relation Age of Onset   Bladder Cancer Father    Diabetes Father      SOCIAL HISTORY:  reports that she has  never smoked. She has never used smokeless tobacco. She reports current alcohol use of about 2.0 - 3.0 standard drinks of alcohol per week. She reports that she does not use drugs. The patient is married and lives in [place]. She works for a company that [employer].   ALLERGIES: Patient has no known allergies.   MEDICATIONS:  Current Outpatient Medications  Medication Sig Dispense Refill   anastrozole  (ARIMIDEX ) 1 MG tablet Take 1 tablet (1 mg total) by mouth daily. 90 tablet 4   b complex vitamins tablet Take 1 tablet by mouth daily.      cyclobenzaprine (FLEXERIL) 5 MG tablet Take 5-10 mg by mouth daily as needed.     mesalamine (LIALDA) 1.2 G EC tablet Take 1,200 mg by mouth daily with breakfast.     metoprolol  tartrate (LOPRESSOR ) 100 MG tablet Take 1 tablet (100 mg total) by mouth once for 1 dose. Take 90-120 minutes prior to scan. 1 tablet 0   Multiple Vitamin (MULITIVITAMIN WITH MINERALS) TABS  Take 1 tablet by mouth daily.     nitroGLYCERIN  (NITROSTAT ) 0.4 MG SL tablet Place 1 tablet (0.4 mg total) under the tongue every 5 (five) minutes as needed for chest pain. 25 tablet 3   SUMAtriptan (IMITREX) 50 MG tablet Take 50 mg by mouth every 2 (two) hours as needed for migraine. May repeat in 2 hours if headache persists or recurs.     venlafaxine  XR (EFFEXOR -XR) 37.5 MG 24 hr capsule TAKE 2 CAPSULES (75 MG TOTAL) BY MOUTH DAILY. 180 capsule 2   No current facility-administered medications for this visit.     REVIEW OF SYSTEMS: On review of systems, the patient reports that she is doing ***     PHYSICAL EXAM:  Wt Readings from Last 3 Encounters:  05/15/22 156 lb 6.4 oz (70.9 kg)  05/30/18 171 lb 9.6 oz (77.8 kg)  07/20/17 168 lb (76.2 kg)   Temp Readings from Last 3 Encounters:  05/30/18 98.3 F (36.8 C) (Oral)  07/20/17 98 F (36.7 C) (Oral)  09/07/16 97.8 F (36.6 C) (Oral)   BP Readings from Last 3 Encounters:  05/30/22 (!) 121/92  05/15/22 108/80  05/30/18 115/87   Pulse Readings from Last 3 Encounters:  05/30/22 76  05/15/22 88  05/30/18 97    In general this is a well appearing *** female in no acute distress. She's alert and oriented x4 and appropriate throughout the examination. Cardiopulmonary assessment is negative for acute distress and she exhibits normal effort. Bilateral breast exam is deferred.    ECOG = ***  0 - Asymptomatic (Fully active, able to carry on all predisease activities without restriction)  1 - Symptomatic but completely ambulatory (Restricted in physically strenuous activity but ambulatory and able to carry out work of a light or sedentary nature. For example, light housework, office work)  2 - Symptomatic, <50% in bed during the day (Ambulatory and capable of all self care but unable to carry out any work activities. Up and about more than 50% of waking hours)  3 - Symptomatic, >50% in bed, but not bedbound (Capable of only  limited self-care, confined to bed or chair 50% or more of waking hours)  4 - Bedbound (Completely disabled. Cannot carry on any self-care. Totally confined to bed or chair)  5 - Death   Aurea Blossom MM, Creech RH, Tormey DC, et al. [phone]). "Toxicity and response criteria of the [facility] Group". Am. Hillard Lowes. Oncol. 5 (6):  161-09    LABORATORY DATA:  Lab Results  Component Value Date   WBC 7.5 05/30/2018   HGB 14.2 05/30/2018   HCT 44.6 05/30/2018   MCV 86.9 05/30/2018   PLT 354 05/30/2018   Lab Results  Component Value Date   NA 140 05/30/2018   K 4.1 05/30/2018   CL 105 05/30/2018   CO2 28 05/30/2018   Lab Results  Component Value Date   ALT 25 05/30/2018   AST 21 05/30/2018   ALKPHOS 88 05/30/2018   BILITOT <0.2 (L) 05/30/2018      RADIOGRAPHY: No results found.     IMPRESSION/PLAN: 1. 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 Dr. Jeryl Moris discusses the pathology findings and reviews the nature of ***  external radiotherapy to the breast  to reduce risks of local recurrence. Dr. Aaron Aas anticipates adjuvant antiestrogen therapy to follow. We discussed the risks, benefits, short, and long term effects of radiotherapy, as well as the curative intent, and the patient is interested in proceeding. Dr. Jeryl Moris discusses the delivery and logistics of radiotherapy and anticipates a course of *** weeks of radiotherapy. We will see her back a few weeks after surgery to discuss the simulation process and anticipate we starting radiotherapy about 4-6 weeks after surgery.   2. Possible genetic predisposition to malignancy. The patient has previously tested negative for BRCA and ***  She will meet with our geneticist today in clinic.   In a visit lasting *** minutes, greater than 50% of the time was spent face to face reviewing her case, as well as in preparation of, discussing, and coordinating the patient's  care.  The above documentation reflects my direct findings during this shared patient visit. Please see the separate note by Dr. Jeryl Moris on this date for the remainder of the patient's plan of care.    Shelvia Dick, [facility]    **Disclaimer: This note was dictated with voice recognition software. Similar sounding words can inadvertently be transcribed and this note may contain transcription errors which may not have been corrected upon publication of note.**

## 2023-12-18 NOTE — Telephone Encounter (Addendum)
 I contacted Erin Castillo because a physician wrote in a 2012 clinic note that she was having genetic testing. There is also a note stating "Patient is known to be BRCA 1 and 2 negative." I asked if she remembered her results/had a copy of them and she said she knows they were negative but she does not have easy access to them. She believes she was only tested for BRCA1/2. Therefore, we will discuss the option of more comprehensive testing during tomorrow's [facility].   Erin Topor, MS, [facility] Genetic Counselor [email or web address] (P) [phone]

## 2023-12-19 ENCOUNTER — Ambulatory Visit
Admission: RE | Admit: 2023-12-19 | Discharge: 2023-12-19 | Disposition: A | Discharge status: Home / Self Care | Source: Ambulatory Visit | Attending: Radiation Oncology | Admitting: Radiation Oncology

## 2023-12-19 ENCOUNTER — Ambulatory Visit: Attending: General Surgery | Admitting: Physical Therapy

## 2023-12-19 ENCOUNTER — Encounter: Payer: Self-pay | Admitting: Genetic Counselor

## 2023-12-19 ENCOUNTER — Other Ambulatory Visit: Payer: Self-pay

## 2023-12-19 ENCOUNTER — Encounter: Payer: Self-pay | Admitting: *Deleted

## 2023-12-19 ENCOUNTER — Inpatient Hospital Stay (HOSPITAL_BASED_OUTPATIENT_CLINIC_OR_DEPARTMENT_OTHER): Admitting: Hematology and Oncology

## 2023-12-19 ENCOUNTER — Inpatient Hospital Stay (HOSPITAL_BASED_OUTPATIENT_CLINIC_OR_DEPARTMENT_OTHER): Admitting: Genetic Counselor

## 2023-12-19 ENCOUNTER — Other Ambulatory Visit: Payer: Self-pay | Admitting: General Surgery

## 2023-12-19 ENCOUNTER — Encounter: Payer: Self-pay | Admitting: Physical Therapy

## 2023-12-19 ENCOUNTER — Other Ambulatory Visit: Payer: Self-pay | Admitting: *Deleted

## 2023-12-19 ENCOUNTER — Inpatient Hospital Stay: Attending: Hematology and Oncology

## 2023-12-19 VITALS — BP 129/77 | HR 84 | Temp 98.2°F | Resp 17 | Ht 64.0 in | Wt 131.2 lb

## 2023-12-19 DIAGNOSIS — Z79811 Long term (current) use of aromatase inhibitors: Secondary | ICD-10-CM | POA: Diagnosis not present

## 2023-12-19 DIAGNOSIS — C50412 Malignant neoplasm of upper-outer quadrant of left female breast: Secondary | ICD-10-CM | POA: Insufficient documentation

## 2023-12-19 DIAGNOSIS — K5190 Ulcerative colitis, unspecified, without complications: Secondary | ICD-10-CM | POA: Insufficient documentation

## 2023-12-19 DIAGNOSIS — Z170 Estrogen receptor positive status [ER+]: Secondary | ICD-10-CM

## 2023-12-19 DIAGNOSIS — Z833 Family history of diabetes mellitus: Secondary | ICD-10-CM | POA: Diagnosis not present

## 2023-12-19 DIAGNOSIS — Z1721 Progesterone receptor positive status: Secondary | ICD-10-CM | POA: Diagnosis not present

## 2023-12-19 DIAGNOSIS — R232 Flushing: Secondary | ICD-10-CM | POA: Diagnosis not present

## 2023-12-19 DIAGNOSIS — N6489 Other specified disorders of breast: Secondary | ICD-10-CM

## 2023-12-19 DIAGNOSIS — Z79899 Other long term (current) drug therapy: Secondary | ICD-10-CM | POA: Diagnosis not present

## 2023-12-19 DIAGNOSIS — Z8052 Family history of malignant neoplasm of bladder: Secondary | ICD-10-CM

## 2023-12-19 DIAGNOSIS — R293 Abnormal posture: Secondary | ICD-10-CM | POA: Diagnosis present

## 2023-12-19 DIAGNOSIS — Z9189 Other specified personal risk factors, not elsewhere classified: Secondary | ICD-10-CM | POA: Insufficient documentation

## 2023-12-19 DIAGNOSIS — Z1732 Human epidermal growth factor receptor 2 negative status: Secondary | ICD-10-CM

## 2023-12-19 DIAGNOSIS — G43909 Migraine, unspecified, not intractable, without status migrainosus: Secondary | ICD-10-CM | POA: Insufficient documentation

## 2023-12-19 DIAGNOSIS — Z8042 Family history of malignant neoplasm of prostate: Secondary | ICD-10-CM

## 2023-12-19 LAB — RESEARCH LABS

## 2023-12-19 NOTE — Progress Notes (Signed)
 REFERRING PROVIDER: [name], MD  PRIMARY PROVIDER:  [name], FNP  PRIMARY REASON FOR VISIT:  1. Malignant neoplasm of upper-outer quadrant of left breast in female, estrogen receptor positive (HCC)   2. Family history of prostate cancer   3. Family history of bladder cancer    HISTORY OF PRESENT ILLNESS:   Ms. Worthington, a 58 y.o. female, was seen for a [place] cancer genetics consultation during the breast multidisciplinary clinic at the request of Dr. Arno Bibles due to a personal and family history of cancer.  Ms. Cahn presents to clinic today to discuss the possibility of a hereditary predisposition to cancer, to discuss genetic testing, and to further clarify her future cancer risks, as well as potential cancer risks for family members.   In May 2025, at the age of 20, Ms. Mountz was diagnosed with invasive ductal carcinoma of the left breast (ER/PR positive, HER2 negative). She also has a history of left breast IDC diagnosed at age 53 (ER/PR positive, HER2 negative). Ms. Luz had negative BRCA1/2 testing in 2012.   CANCER HISTORY:  Oncology History  Malignant neoplasm of upper-outer quadrant of left breast in female, estrogen receptor positive (HCC)  04/26/2011 Initial Diagnosis   Malignant neoplasm of upper-outer quadrant of left breast in female, estrogen receptor positive (HCC)   11/30/2023 Mammogram   Focal asymmetry with calcifications in the left breast is indeterminate. Additional views with US  is recommended. US  in the breast   12/11/2023 Pathology Results   Left breast needle core biopsy showed invasive poorly differentiated adenocarcinoma grade 3 prognostic showed ER 100% positive strong staining PR 60% positive strong staining Ki-67 of 30% and group 5 HER2 negative     Past Medical History:  Diagnosis Date   Achilles tendonitis    Anxiety    Breast cancer (HCC) 04/20/2011   Left, ER/PR+   Chest pain    Depression    Dyspareunia in female    Dysthymic  disorder    GAD (generalized anxiety disorder)    Heel spur, left    History of radiation therapy    09/18/11 to 11/03/11, left breast   Hypercholesterolemia    Hypotension    Insomnia    Migraines    Osteopenia    Postmenopausal atrophic vaginitis    Ulcerative colitis (HCC)     Past Surgical History:  Procedure Laterality Date   BREAST LUMPECTOMY  05/15/11   left breast lumpectomy    CESAREAN SECTION  2000   MASTECTOMY PARTIAL / LUMPECTOMY W/ AXILLARY LYMPHADENECTOMY  10/12   snbx   NASAL SINUS SURGERY  1991   PORT-A-CATH REMOVAL  12/12/2011   Procedure: REMOVAL PORT-A-CATH;  Surgeon: Quitman Bucy, MD;  Location: [facility];  Service: General;  Laterality: Right;   PORTACATH PLACEMENT  06/12/2011   Procedure: INSERTION PORT-A-CATH;  Surgeon: Quitman Bucy, MD;  Location: [facility];  Service: General;  Laterality: Right;  Right Subclavian Vein   SINUS EXPLORATION     remote    Social History   Socioeconomic History   Marital status: Married    Spouse name: Not on file   Number of children: Not on file   Years of education: Not on file   Highest education level: Not on file  Occupational History   Occupation: office manager  Tobacco Use   Smoking status: Never   Smokeless tobacco: Never  Substance and Sexual Activity   Alcohol use: Yes    Alcohol/week: 2.0 -  3.0 standard drinks of alcohol    Types: 2 - 3 Glasses of wine per week    Comment: occassionally   Drug use: No   Sexual activity: Not on file  Other Topics Concern   Not on file  Social History Narrative   3 children   Social Drivers of Health   Financial Resource Strain: Not on file  Food Insecurity: Not on file  Transportation Needs: Not on file  Physical Activity: Not on file  Stress: Not on file  Social Connections: Not on file     FAMILY HISTORY:  We obtained a detailed, 4-generation family history.  Significant diagnoses are listed below: Family History  Problem  Relation Age of Onset   Bladder Cancer Father 60   Diabetes Father    Prostate cancer Paternal Grandfather      Ms. Kloehn is unaware of previous family history of genetic testing for hereditary cancer risks. There is no reported Ashkenazi Jewish ancestry.   GENETIC COUNSELING ASSESSMENT: Ms. Frede is a 58 y.o. female with a personal and family history of cancer which is somewhat suggestive of a hereditary cancer syndrome and predisposition to cancer. We, therefore, discussed and recommended the following at today's visit.   DISCUSSION: We discussed that 5 - 10% of cancer is hereditary, with most cases of hereditary breast cancer associated with mutations in BRCA1/2.  There are other genes that can be associated with hereditary breast cancer syndromes. Type of cancer risk and level of risk are gene-specific. We discussed that testing is beneficial for several reasons including knowing how to follow individuals after completing their treatment, identifying whether potential treatment options would be beneficial, and understanding if other family members could be at risk for cancer and allowing them to undergo genetic testing.   We reviewed the characteristics, features and inheritance patterns of hereditary cancer syndromes. We also discussed genetic testing, including the appropriate family members to test, the process of testing, insurance coverage and turn-around-time for results. We discussed the implications of a negative, positive and/or variant of uncertain significant result. In order to get genetic test results in a timely manner so that Ms. Fix can use these genetic test results for surgical decisions, we recommended Ms. Barocio pursue genetic testing for the BRCAplus panel. Once complete, we recommend Ms. Shaddock pursue reflex genetic testing to a more comprehensive gene panel.   Ms. Acre  was offered a common hereditary cancer panel (40 genes) and an expanded pan-cancer panel (77 genes). Ms.  Preslar was informed of the benefits and limitations of each panel, including that expanded pan-cancer panels contain genes that do not have clear management guidelines at this point in time.  We also discussed that as the number of genes included on a panel increases, the chances of variants of uncertain significance increases. After considering the benefits and limitations of each gene panel, Ms. Nulty elected to have [facility] CancerNext-Expanded Panel.  The CancerNext-Expanded gene panel offered by [facility] and includes sequencing, rearrangement, and RNA analysis for the following 77 genes: AIP, ALK, APC, ATM, AXIN2, BAP1, BARD1, BMPR1A, BRCA1, BRCA2, BRIP1, CDC73, CDH1, CDK4, CDKN1B, CDKN2A, CEBPA, CHEK2, CTNNA1, DDX41, DICER1, ETV6, FH, FLCN, GATA2, LZTR1, MAX, MBD4, MEN1, MET, MLH1, MSH2, MSH3, MSH6, MUTYH, NF1, NF2, NTHL1, PALB2, PHOX2B, PMS2, POT1, PRKAR1A, PTCH1, PTEN, RAD51C, RAD51D, RB1, RET, RPS20, RUNX1, SDHA, SDHAF2, SDHB, SDHC, SDHD, SMAD4, SMARCA4, SMARCB1, SMARCE1, STK11, SUFU, TMEM127, TP53, TSC1, TSC2, VHL, and WT1 (sequencing and deletion/duplication); EGFR, HOXB13, KIT, MITF, PDGFRA, POLD1, and  POLE (sequencing only); EPCAM and GREM1 (deletion/duplication only).   Based on Ms. Coger's personal and family history of cancer, she meets medical criteria for genetic testing. Despite that she meets criteria, she may still have an out of pocket cost. We discussed that if her out of pocket cost for testing is over $100, the laboratory should contact them to discuss self-pay prices, patient pay assistance programs, if applicable, and other billing options.   PLAN: After considering the risks, benefits, and limitations, Ms. Ismael provided informed consent to pursue genetic testing and the blood sample was sent to [facility] for analysis of the CancerNext-Expanded Panel. Results should be available within approximately 1-2 weeks' time, at which point they will be disclosed by telephone to  Ms. Yow, as will any additional recommendations warranted by these results. Ms. Orihuela will receive a summary of her genetic counseling visit and a copy of her results once available. This information will also be available in Epic.   Ms. Garretson questions were answered to her satisfaction today. Our contact information was provided should additional questions or concerns arise. Thank you for the referral and allowing us  to share in the care of your patient.   Sarim Rothman, MS, [facility] Genetic Counselor [email or web address] (P) [phone]  40 minutes were spent on the date of the encounter in service to the patient including preparation, face-to-face consultation, documentation and care coordination.  The patient brought her husband. Drs. Gudena and/or Maryalice Smaller were available to discuss this case as needed.  _______________________________________________________________________ For Office Staff:  Number of people involved in session: 2 Was an Intern/ student involved with case: no

## 2023-12-19 NOTE — Research (Signed)
 [employer] 2021-05 - Specimen Collection Study to Evaluate Biomarkers in Subjects with Cancer    This Nurse has reviewed this patient's inclusion and exclusion criteria as a second review and confirms Erin Castillo is eligible for study participation.  Patient may continue with enrollment.  Dagmar Drones, RN, BSN, [facility] 12/19/2023 12:24 PM

## 2023-12-19 NOTE — Assessment & Plan Note (Signed)
 Assessment and Plan Assessment & Plan Breast cancer with local recurrence vs second primary? Local recurrence 13 years post initial treatment, high grade, poorly differentiated, ER-positive, high Ki-67 vs second primary. It is nearly impossible to distinguish because this happened almost 13 yrs later. Mastectomy planned with lymph node assessment. Oncotype testing to guide treatment especially if we treat this as a second primary - If this is indeed local recurrence, re staging is SOC. We will aim for CT and bone scan to assess the extent of the disease. - Perform mastectomy with lymph node biopsy. - Order Oncotype DX test post-surgery. - Initiate letrozole if Oncotype score is low after surgery - Consider chemotherapy if Oncotype score is high. We will recommend adj TC every 21 days for 4 cycles.  Ulcerative colitis Chronic condition managed, no changes discussed.  Vasomotor symptoms Managed with Effexor .  Migraine Managed with sumatriptan, no changes discussed.  Follow-up - Schedule follow-up appointment approximately three weeks post-surgery to review Oncotype results and discuss further treatment options.

## 2023-12-19 NOTE — Therapy (Signed)
 OUTPATIENT PHYSICAL THERAPY BREAST CANCER BASELINE EVALUATION   Patient Name: Erin Castillo MRN: [id number] DOB:1966/07/16, 58 y.o., female Today's Date: 12/19/2023  END OF SESSION:  PT End of Session - 12/19/23 1244     Visit Number 1    Number of Visits 2    Date for PT Re-Evaluation 02/13/24    PT Start Time 1058    PT Stop Time 1126    PT Time Calculation (min) 28 min    Activity Tolerance Patient tolerated treatment well    Behavior During Therapy [facility] for tasks assessed/performed             Past Medical History:  Diagnosis Date   Achilles tendonitis    Anxiety    Breast cancer (HCC) 04/20/2011   Left, ER/PR+   Chest pain    Depression    Dyspareunia in female    Dysthymic disorder    GAD (generalized anxiety disorder)    Heel spur, left    History of radiation therapy    09/18/11 to 11/03/11, left breast   Hypercholesterolemia    Hypotension    Insomnia    Migraines    Osteopenia    Postmenopausal atrophic vaginitis    Ulcerative colitis (HCC)    Past Surgical History:  Procedure Laterality Date   BREAST LUMPECTOMY  05/15/11   left breast lumpectomy    CESAREAN SECTION  2000   MASTECTOMY PARTIAL / LUMPECTOMY W/ AXILLARY LYMPHADENECTOMY  10/12   snbx   NASAL SINUS SURGERY  1991   PORT-A-CATH REMOVAL  12/12/2011   Procedure: REMOVAL PORT-A-CATH;  Surgeon: Quitman Bucy, MD;  Location: [facility];  Service: General;  Laterality: Right;   PORTACATH PLACEMENT  06/12/2011   Procedure: INSERTION PORT-A-CATH;  Surgeon: Quitman Bucy, MD;  Location: [facility];  Service: General;  Laterality: Right;  Right Subclavian Vein   SINUS EXPLORATION     remote   Patient Active Problem List   Diagnosis Date Noted   Numbness and tingling in left arm 04/14/2014   Diarrhea 06/30/2011   Malignant neoplasm of upper-outer quadrant of left breast in female, estrogen receptor positive (HCC) 04/26/2011   REFERRING PROVIDER: Dr. [name]  REFERRING DIAG: Left breast cancer  THERAPY DIAG:  Malignant neoplasm of upper-outer quadrant of left breast in female, estrogen receptor positive (HCC)  Abnormal posture  At risk for lymphedema  Rationale for Evaluation and Treatment: Rehabilitation  ONSET DATE: 11/30/2023  SUBJECTIVE:  SUBJECTIVE STATEMENT: Patient reports she is here today to be seen by her medical team for her newly diagnosed left breast cancer.   PERTINENT HISTORY:  Patient was diagnosed on 11/30/2023 with left grade 3 invasive ductal carcinoma breast cancer. It measures 2 cm and is located in the upper outer quadrant. It is ER/PR positive and HER2 negative with a Ki67 of 30%. She has a history of left breast cancer in 2012 and was treated with a lumpectomy and sentinel node biopsy (1/1 positive node), chemotherapy and radiation.  PATIENT GOALS:   reduce lymphedema risk and learn post op HEP.   PAIN:  Are you having pain? Yes: NPRS scale: 2/10 currently but can be as high as 10/10 Pain location: Left upper arm Pain description: sharp Aggravating factors: Certain arm movements, specifically shoulder horizontal adduction Relieving factors: Unknown  PRECAUTIONS: Active CA Other: at risk for left arm lymphedema  RED FLAGS: None   HAND DOMINANCE: right  WEIGHT BEARING RESTRICTIONS: No  FALLS:  Has patient fallen in last 6 months? No  LIVING ENVIRONMENT: Patient lives with: her husband Lives in: House/apartment Has following equipment at home: None  OCCUPATION: Social research officer, government; [employer]  LEISURE: Walks a lot at work  PRIOR LEVEL OF FUNCTION: Independent   OBJECTIVE: Note: Objective measures were completed at Evaluation unless otherwise noted.  COGNITION: Overall cognitive status: Within functional  limits for tasks assessed    POSTURE:  Forward head and rounded shoulders posture  UPPER EXTREMITY AROM/PROM:  A/PROM RIGHT   eval   Shoulder extension 49  Shoulder flexion 152  Shoulder abduction 162  Shoulder internal rotation 73  Shoulder external rotation 86    (Blank rows = not tested)  A/PROM LEFT   eval  Shoulder extension 55  Shoulder flexion 142  Shoulder abduction 165  Shoulder internal rotation 69  Shoulder external rotation 81    (Blank rows = not tested)  CERVICAL AROM: All within normal limits  UPPER EXTREMITY STRENGTH: WNL  LYMPHEDEMA ASSESSMENTS (in cm):   LANDMARK RIGHT   eval  10 cm proximal to olecranon process 24.4  Olecranon process 22.9  10 cm proximal to ulnar styloid process 19.8  Just proximal to ulnar styloid process 14.8  Across hand at thumb web space 16.5  At base of 2nd digit 6  (Blank rows = not tested)  LANDMARK LEFT   eval  10 cm proximal to olecranon process 23.1  Olecranon process 23.1  10 cm proximal to ulnar styloid process 19.4  Just proximal to ulnar styloid process 14.6  Across hand at thumb web space 16.3  At base of 2nd digit 5.8  (Blank rows = not tested)  L-DEX LYMPHEDEMA SCREENING:  The patient was assessed using the L-Dex machine today to produce a lymphedema index baseline score. The patient will be reassessed on a regular basis (typically every 3 months) to obtain new L-Dex scores. If the score is > 6.5 points away from his/her baseline score indicating onset of subclinical lymphedema, it will be recommended to wear a compression garment for 4 weeks, 12 hours per day and then be reassessed. If the score continues to be > 6.5 points from baseline at reassessment, we will initiate lymphedema treatment. Assessing in this manner has a 95% rate of preventing clinically significant lymphedema.   L-DEX FLOWSHEETS - 12/19/23 1200       L-DEX LYMPHEDEMA SCREENING   Measurement Type Unilateral    L-DEX MEASUREMENT  EXTREMITY Upper Extremity    POSITION  Standing    DOMINANT SIDE Right    At Risk Side Left    BASELINE SCORE (UNILATERAL) 3.2             QUICK DASH SURVEY:  Cindia Crease - 12/19/23 0001     Open a tight or new jar Mild difficulty    Do heavy household chores (wash walls, wash floors) No difficulty    Carry a shopping bag or briefcase No difficulty    Wash your back No difficulty    Use a knife to cut food No difficulty    Recreational activities in which you take some force or impact through your arm, shoulder, or hand (golf, hammering, tennis) No difficulty    During the past week, to what extent has your arm, shoulder or hand problem interfered with your normal social activities with family, friends, neighbors, or groups? Slightly    During the past week, to what extent has your arm, shoulder or hand problem limited your work or other regular daily activities Not at all    Arm, shoulder, or hand pain. Moderate    Tingling (pins and needles) in your arm, shoulder, or hand None    Difficulty Sleeping No difficulty    DASH Score 9.09 %              PATIENT EDUCATION:  Education details: Time spent educating patient on aspects of self-care to maximize post op recovery. Patient was educated on where and how to get a post op compression bra to use to reduce post op edema. Patient was also educated on the use of SOZO screenings and surveillance principles for early identification of lymphedema onset. She was instructed to use the post op pillow in the axilla for pressure and pain relief. Patient educated on lymphedema risk reduction and post op shoulder/posture HEP. Person educated: Patient Education method: Explanation, Demonstration, Handout Education comprehension: Patient verbalized understanding and returned demonstration  HOME EXERCISE PROGRAM: Patient was instructed today in a home exercise program today for post op shoulder range of motion. These included active assist  shoulder flexion in sitting, scapular retraction, wall walking with shoulder abduction, and hands behind head external rotation.  She was encouraged to do these twice a day, holding 3 seconds and repeating 5 times when permitted by her physician.   ASSESSMENT:  CLINICAL IMPRESSION: Patient was diagnosed on 11/30/2023 with left grade 3 invasive ductal carcinoma breast cancer. It measures 2 cm and is located in the upper outer quadrant. It is ER/PR positive and HER2 negative with a Ki67 of 30%. She has a history of left breast cancer in 2012 and was treated with a lumpectomy and sentinel node biopsy (1/1 positive node), chemotherapy and radiation.Her multidisciplinary medical team met prior to her assessments to determine a recommended treatment plan. She is planning to have a left mastectomy and sentinel node biopsy followed by anti-estrogen therapy. She will benefit from a post op PT reassessment to determine needs and from L-Dex screens every 3 months for 2 years to detect subclinical lymphedema.  Pt will benefit from skilled therapeutic intervention to improve on the following deficits: Decreased knowledge of precautions, impaired UE functional use, pain, decreased ROM, postural dysfunction.   PT treatment/interventions: ADL/self-care home management, pt/family education, therapeutic exercise  REHAB POTENTIAL: Excellent  CLINICAL DECISION MAKING: Stable/uncomplicated  EVALUATION COMPLEXITY: Low   GOALS: Goals reviewed with patient? YES  LONG TERM GOALS: (STG=LTG)    Name Target Date Goal status  1 Pt will be able  to verbalize understanding of pertinent lymphedema risk reduction practices relevant to her dx specifically related to skin care.  Baseline:  No knowledge 12/19/2023 Achieved at eval  2 Pt will be able to return demo and/or verbalize understanding of the post op HEP related to regaining shoulder ROM. Baseline:  No knowledge 12/19/2023 Achieved at eval  3 Pt will be able to  verbalize understanding of the importance of viewing the post op After Breast CA Class video for further lymphedema risk reduction education and therapeutic exercise.  Baseline:  No knowledge 12/19/2023 Achieved at eval  4 Pt will demo she has regained full shoulder ROM and function post operatively compared to baselines.  Baseline: See objective measurements taken today. 02/13/2024     PLAN:  PT FREQUENCY/DURATION: EVAL and 1 follow up appointment.   PLAN FOR NEXT SESSION: will reassess 3-4 weeks post op to determine needs.   Patient will follow up at outpatient cancer rehab 3-4 weeks following surgery.  If the patient requires physical therapy at that time, a specific plan will be dictated and sent to the referring physician for approval. The patient was educated today on appropriate basic range of motion exercises to begin post operatively and the importance of viewing the After Breast Cancer class video following surgery.  Patient was educated today on lymphedema risk reduction practices as it pertains to recommendations that will benefit the patient immediately following surgery.  She verbalized good understanding.    Physical Therapy Information for After Breast Cancer Surgery/Treatment:  Lymphedema is a swelling condition that you may be at risk for in your arm if you have lymph nodes removed from the armpit area.  After a sentinel node biopsy, the risk is approximately 5-9% and is higher after an axillary node dissection.  There is treatment available for this condition and it is not life-threatening.  Contact your physician or physical therapist with concerns. You may begin the 4 shoulder/posture exercises (see additional sheet) when permitted by your physician (typically a week after surgery).  If you have drains, you may need to wait until those are removed before beginning range of motion exercises.  A general recommendation is to not lift your arms above shoulder height until drains are  removed.  These exercises should be done to your tolerance and gently.  This is not a "no pain/no gain" type of recovery so listen to your body and stretch into the range of motion that you can tolerate, stopping if you have pain.  If you are having immediate reconstruction, ask your plastic surgeon about doing exercises as he or she may want you to wait. We encourage you to view the After Breast Cancer class video following surgery.  You will learn information related to lymphedema risk, prevention and treatment and additional exercises to regain mobility following surgery.   While undergoing any medical procedure or treatment, try to avoid blood pressure being taken or needle sticks from occurring on the arm on the side of cancer.   This recommendation begins after surgery and continues for the rest of your life.  This may help reduce your risk of getting lymphedema (swelling in your arm). An excellent resource for those seeking information on lymphedema is the National Lymphedema Network's web site. It can be accessed at www.lymphnet.org If you notice swelling in your hand, arm or breast at any time following surgery (even if it is many years from now), please contact your doctor or physical therapist to discuss this.  Lymphedema can be  treated at any time but it is easier for you if it is treated early on.  If you feel like your shoulder motion is not returning to normal in a reasonable amount of time, please contact your surgeon or physical therapist.  [facility] [phone]. [address], [place] [address]  [facility] CLASS After Breast Cancer Class  After Breast Cancer Class is a specially designed exercise class video to assist you in a safe recover after having breast cancer surgery.  In this video you will learn how to get back to full function whether your drains were just removed or if you had surgery a month ago. The video can be viewed on this page:  https://www.boyd-meyer.org/ or on YouTube here: https://youtu.[email or web address].  Class Goals  Understand specific stretches to improve the flexibility of you chest and shoulder. Learn ways to safely strengthen your upper body and improve your posture. Understand the warning signs of infection and why you may be at risk for an arm infection. Learn about Lymphedema and prevention.  ** You do not need to view this video until after surgery.  Drains should be removed to participate in the recommended exercises on the video.  Patient was instructed today in a home exercise program today for post op shoulder range of motion. These included active assist shoulder flexion in sitting, scapular retraction, wall walking with shoulder abduction, and hands behind head external rotation.  She was encouraged to do these twice a day, holding 3 seconds and repeating 5 times when permitted by her physician.  Rollin Clock, [place] 12/19/23 12:54 PM

## 2023-12-19 NOTE — Progress Notes (Signed)
 Erin Castillo CONSULT NOTE  Patient Care Team: [name], FNP as PCP - General (Family Medicine) [name], MD (Inactive) as Consulting Physician (General Surgery) [name], MD (Inactive) as Consulting Physician (Radiation Oncology) [name], MD as Consulting Physician (Hematology and Oncology) [name], RN as Oncology Nurse Navigator [name], RN as Oncology Nurse Navigator [name], MD as Consulting Physician (General Surgery) [name], MD as Consulting Physician (Radiation Oncology)  CHIEF COMPLAINTS/PURPOSE OF CONSULTATION:  Newly diagnosed breast cancer  HISTORY OF PRESENTING ILLNESS:  Erin Castillo 58 y.o. female is here because of recent diagnosis of left   I reviewed her records extensively and collaborated the history with the patient.  SUMMARY OF ONCOLOGIC HISTORY: Oncology History  Malignant neoplasm of upper-outer quadrant of left breast in female, estrogen receptor positive (HCC)  04/26/2011 Initial Diagnosis   Malignant neoplasm of upper-outer quadrant of left breast in female, estrogen receptor positive (HCC)   11/30/2023 Mammogram   Focal asymmetry with calcifications in the left breast is indeterminate. Additional views with US  is recommended. US  in the breast   12/11/2023 Pathology Results   Left breast needle core biopsy showed invasive poorly differentiated adenocarcinoma grade 3 prognostic showed ER 100% positive strong staining PR 60% positive strong staining Ki-67 of 30% and group 5 HER2 negative    Discussed the use of AI scribe software for clinical note transcription with the patient, who gave verbal consent to proceed.  History of Present Illness Erin Castillo is a 58 year old female with a history of breast cancer who presents for evaluation of a possible local recurrence.  She has a history of breast cancer with a previous positive lymph node biopsy. Thirteen years ago, she underwent  treatment which included taking a pill for seven years. Recently, a single lymph node was found to be positive, raising concerns about whether this represents a local recurrence or a second primary cancer. Her breast cancer is described as high grade and estrogen receptor positive, with a progesterone receptor positivity of 60%. The KI-67 index is high, indicating a high growth rate. She has previously undergone chemotherapy and is concerned about the possibility of needing it again. She has previously undergone radiation therapy, which complicates the possibility of reradiation.  In addition to her breast cancer, she has a history of ulcerative colitis and experiences migraines for which she takes sumatriptan. She also takes Effexor  for hot flashes. There is no family history of breast cancer, but her father had bladder cancer in his seventies.  [place] and is considering follow-up care options post-surgery. She has three children, including twins, and works in [employer]. She plans to work remotely after moving and will return twice a month for work obligations. She did not breastfeed her children and has not used hormone replacement therapy or birth control. No current swelling in her legs, describes her ankles as 'fat ankles' due to family teasing.   MEDICAL HISTORY:  Past Medical History:  Diagnosis Date   Achilles tendonitis    Anxiety    Breast cancer (HCC) 04/20/2011   Left, ER/PR+   Chest pain    Depression    Dyspareunia in female    Dysthymic disorder    GAD (generalized anxiety disorder)    Heel spur, left    History of radiation therapy    09/18/11 to 11/03/11, left breast   Hypercholesterolemia    Hypotension  Insomnia    Migraines    Osteopenia    Postmenopausal atrophic vaginitis    Ulcerative colitis (HCC)     SURGICAL HISTORY: Past Surgical History:  Procedure Laterality Date   BREAST LUMPECTOMY  05/15/11   left  breast lumpectomy    CESAREAN SECTION  2000   MASTECTOMY PARTIAL / LUMPECTOMY W/ AXILLARY LYMPHADENECTOMY  10/12   snbx   NASAL SINUS SURGERY  1991   PORT-A-CATH REMOVAL  12/12/2011   Procedure: REMOVAL PORT-A-CATH;  Surgeon: Quitman Bucy, MD;  Location: [facility];  Service: General;  Laterality: Right;   PORTACATH PLACEMENT  06/12/2011   Procedure: INSERTION PORT-A-CATH;  Surgeon: Quitman Bucy, MD;  Location: [facility];  Service: General;  Laterality: Right;  Right Subclavian Vein   SINUS EXPLORATION     remote    SOCIAL HISTORY: Social History   Socioeconomic History   Marital status: Married    Spouse name: Not on file   Number of children: Not on file   Years of education: Not on file   Highest education level: Not on file  Occupational History   Occupation: office manager  Tobacco Use   Smoking status: Never   Smokeless tobacco: Never  Substance and Sexual Activity   Alcohol use: Yes    Alcohol/week: 2.0 - 3.0 standard drinks of alcohol    Types: 2 - 3 Glasses of wine per week    Comment: occassionally   Drug use: No   Sexual activity: Not on file  Other Topics Concern   Not on file  Social History Narrative   3 children   Social Drivers of Health   Financial Resource Strain: Not on file  Food Insecurity: Not on file  Transportation Needs: Not on file  Physical Activity: Not on file  Stress: Not on file  Social Connections: Not on file  Intimate Partner Violence: Not on file    FAMILY HISTORY: Family History  Problem Relation Age of Onset   Bladder Cancer Father    Diabetes Father     ALLERGIES:  has no known allergies.  MEDICATIONS:  Current Outpatient Medications  Medication Sig Dispense Refill   mesalamine (LIALDA) 1.2 G EC tablet Take 1,200 mg by mouth daily with breakfast.     Multiple Vitamin (MULITIVITAMIN WITH MINERALS) TABS Take 1 tablet by mouth daily.     SUMAtriptan (IMITREX) 50 MG tablet Take 50 mg by mouth  every 2 (two) hours as needed for migraine. May repeat in 2 hours if headache persists or recurs.     venlafaxine  XR (EFFEXOR -XR) 37.5 MG 24 hr capsule TAKE 2 CAPSULES (75 MG TOTAL) BY MOUTH DAILY. 180 capsule 2   anastrozole  (ARIMIDEX ) 1 MG tablet Take 1 tablet (1 mg total) by mouth daily. 90 tablet 4   b complex vitamins tablet Take 1 tablet by mouth daily.      metoprolol  tartrate (LOPRESSOR ) 100 MG tablet Take 1 tablet (100 mg total) by mouth once for 1 dose. Take 90-120 minutes prior to scan. 1 tablet 0   nitroGLYCERIN  (NITROSTAT ) 0.4 MG SL tablet Place 1 tablet (0.4 mg total) under the tongue every 5 (five) minutes as needed for chest pain. 25 tablet 3   No current facility-administered medications for this visit.    REVIEW OF SYSTEMS:   Constitutional: Denies fevers, chills or abnormal night sweats Eyes: Denies blurriness of vision, double vision or watery eyes Ears, nose, mouth, throat, and face: Denies mucositis or sore throat  Respiratory: Denies cough, dyspnea or wheezes Cardiovascular: Denies palpitation, chest discomfort or lower extremity swelling Gastrointestinal:  Denies nausea, heartburn or change in bowel habits Skin: Denies abnormal skin rashes Lymphatics: Denies new lymphadenopathy or easy bruising Neurological:Denies numbness, tingling or new weaknesses Behavioral/Psych: Mood is stable, no new changes  Breast: Denies any palpable lumps or discharge All other systems were reviewed with the patient and are negative.  PHYSICAL EXAMINATION: ECOG PERFORMANCE STATUS: 0 - Asymptomatic  Vitals:   12/19/23 0835  BP: 129/77  Pulse: 84  Resp: 17  Temp: 98.2 F (36.8 C)  SpO2: 100%   Filed Weights   12/19/23 0835  Weight: 131 lb 3.2 oz (59.5 kg)    GENERAL:alert, no distress and comfortable NECK: supple, thyroid normal size, non-tender, without nodularity LYMPH:  no palpable lymphadenopathy in the cervical, axillary or inguinal BREAST: In the left breast, there is  a large density which according to the patient is chronic, adjacent to the density there is a changing abnormality, now this mass is not as well defined. No regional adenopathy No LE edema  LABORATORY DATA:  I have reviewed the data as listed Lab Results  Component Value Date   WBC 7.5 05/30/2018   HGB 14.2 05/30/2018   HCT 44.6 05/30/2018   MCV 86.9 05/30/2018   PLT 354 05/30/2018   Lab Results  Component Value Date   NA 140 05/30/2018   K 4.1 05/30/2018   CL 105 05/30/2018   CO2 28 05/30/2018    RADIOGRAPHIC STUDIES: I have personally reviewed the radiological reports and agreed with the findings in the report.  ASSESSMENT AND PLAN:  Malignant neoplasm of upper-outer quadrant of left breast in female, estrogen receptor positive (HCC) Assessment and Plan Assessment & Plan Breast cancer with local recurrence vs second primary? Local recurrence 13 years post initial treatment, high grade, poorly differentiated, ER-positive, high Ki-67 vs second primary. It is nearly impossible to distinguish because this happened almost 13 yrs later. Mastectomy planned with lymph node assessment. Oncotype testing to guide treatment especially if we treat this as a second primary - If this is indeed local recurrence, re staging is SOC. We will aim for CT and bone scan to assess the extent of the disease. - Perform mastectomy with lymph node biopsy. - Order Oncotype DX test post-surgery. - Initiate letrozole if Oncotype score is low after surgery - Consider chemotherapy if Oncotype score is high. We will recommend adj TC every 21 days for 4 cycles.  Ulcerative colitis Chronic condition managed, no changes discussed.  Vasomotor symptoms Managed with Effexor .  Migraine Managed with sumatriptan, no changes discussed.  Follow-up - Schedule follow-up appointment approximately three weeks post-surgery to review Oncotype results and discuss further treatment options.    All questions were  answered. The patient knows to call the clinic with any problems, questions or concerns.    [name], MD 12/19/23

## 2023-12-19 NOTE — Research (Signed)
 [employer] 2021-05 - Specimen Collection Study to Evaluate Biomarkers in Subjects with Cancer     Patient Erin Castillo was identified by Dr. Arno Bibles as a potential candidate for the above listed study.  This Clinical Research Coordinator met with Kameron Garcialopez, [id number], on 12/19/23 in a manner and location that ensures patient privacy to discuss participation in the above listed research study.  Patient is Accompanied by husband.  A copy of the informed consent document with embedded HIPAA language was provided to the patient.  Patient reads, speaks, and understands [place].    Patient was provided with the business card of this Coordinator and encouraged to contact the research team with any questions.  Patient was provided the option of taking informed consent documents home to review and was encouraged to review at their convenience with their support network, including other care providers. Patient is comfortable with making a decision regarding study participation today.  As outlined in the informed consent form, this Coordinator and Nadja Schwake discussed the purpose of the research study, the investigational nature of the study, study procedures and requirements for study participation, potential risks and benefits of study participation, as well as alternatives to participation. This study is not blinded. The patient understands participation is voluntary and they may withdraw from study participation at any time.  This study does not involve randomization.  This study does not involve an investigational drug or device. This study does not involve a placebo. Patient understands enrollment is pending full eligibility review.   Confidentiality and how the patient's information will be used as part of study participation were discussed.  Patient was informed there is reimbursement provided for their time and effort spent on trial participation.    All questions were answered to patient's  satisfaction.  The informed consent with embedded HIPAA language was reviewed page by page.  The patient's mental and emotional status is appropriate to provide informed consent, and the patient verbalizes an understanding of study participation.  Patient has agreed to participate in the above listed research study and has voluntarily signed the informed consent version 06 Aug 2020 Revised 22 Aug 2021 with embedded HIPAA language, version   06 Aug 2020 Revised 22 Aug 2021 on 12/19/23 at 11:35 AM.  The patient was provided with a copy of the signed informed consent form with embedded HIPAA language for their reference.  No study specific procedures were obtained prior to the signing of the informed consent document.  Approximately 15 minutes were spent with the patient reviewing the informed consent documents.  Patient was not requested to complete a Release of Information form.   Patient confirms she was taking Anastrazole. Patient reports last dose was in 2019.  Eligibility: Eligibility criteria reviewed with patient. This nurse/coordinator has reviewed this patient's inclusion and exclusion criteria and confirmed patient is eligible for study participation. Eligibility confirmed by treating investigator Dr. Arno Bibles, who also agrees that patient should proceed with enrollment.  Patient will continue with enrollment.  Data Collection: Patient was interviewed to collect the following information.  Medical History:  High Blood Pressure  No Coronary Artery Disease No Lupus    No Rheumatoid Arthritis  No Diabetes   No       Lynch Syndrome  No  Is the patient currently taking a magnesium supplement?   Yes If yes, dose and frequency? 240 mg/ nightly Indication? Help sleep Start date? Started 3 months ago; Feb. 28 2025  Does the patient have a personal history of cancer (  greater than 5 years ago)?  Yes If yes, Cancer type and date of diagnosis?   breast ;04/2011 Has this previous diagnosis been  treated? yes      If so, treatment type? Chemo, radiation, lumpectomy surgery   Start and end dates of last treatment cycle? 2012-2013; medication stop 2019  Does the patient have a family history of cancer in 1st or 2nd degree relatives? Yes If yes, Relationship(s) and Cancer type(s)? Dad- bladder  Does the patient have history of alcohol consumption? Yes   If yes, current or former? current  Number of years? Since 19; 36 years Drinks per week? 2 glasses  Does the patient have history of cigarette, cigar, pipe, or chewing tobacco use?  Yes  If yes, current for former? Former; [phone] If yes, type (Cigarette, cigar, pipe, and/or chewing tobacco)? cigarette  If former, year stopped? 1987 Number of years? 2 yrs Packs/number/containers per day? 1 cigarette during occasional social events; did not smoke a pack, did not smoke everyday.  Blood Collection: Research blood obtained by fresh venipuncture with Fidel Huddle, Research Specialist. Patient tolerated well without any adverse events.  Gift Card: $50 gift card given to patient for her participation in this study.    Patient was thanked for their participation in this study.

## 2023-12-20 ENCOUNTER — Encounter: Payer: Self-pay | Admitting: Licensed Clinical Social Worker

## 2023-12-20 NOTE — Progress Notes (Signed)
 [facility] Clinical Social Work  Clinical Social Work was referred by [facility] for assessment of psychosocial needs.  Clinical Social Worker attempted to contact patient by phone to offer support and assess for needs.    No answer. Left VM introducing self and explaining support services. No SDOH needs noted on screener. CSW left direct contact information and encouraged pt to call with questions.     Rachid Parham E Tnia Anglada, LCSW  Clinical Social Worker [facility]

## 2023-12-22 ENCOUNTER — Encounter: Payer: Self-pay | Admitting: Hematology and Oncology

## 2023-12-23 DIAGNOSIS — R911 Solitary pulmonary nodule: Secondary | ICD-10-CM

## 2023-12-23 HISTORY — DX: Solitary pulmonary nodule: R91.1

## 2023-12-25 ENCOUNTER — Encounter: Payer: Self-pay | Admitting: Nurse Practitioner

## 2023-12-26 ENCOUNTER — Ambulatory Visit (HOSPITAL_COMMUNITY)
Admission: RE | Admit: 2023-12-26 | Discharge: 2023-12-26 | Disposition: A | Discharge status: Home / Self Care | Source: Ambulatory Visit | Attending: Hematology and Oncology | Admitting: Hematology and Oncology

## 2023-12-26 DIAGNOSIS — Z170 Estrogen receptor positive status [ER+]: Secondary | ICD-10-CM | POA: Diagnosis present

## 2023-12-26 DIAGNOSIS — C50412 Malignant neoplasm of upper-outer quadrant of left female breast: Secondary | ICD-10-CM | POA: Insufficient documentation

## 2023-12-26 MED ORDER — TECHNETIUM TC 99M MEDRONATE IV KIT
20.6000 | PACK | Freq: Once | INTRAVENOUS | Status: AC
  Administered 2023-12-26: 20.6 via INTRAVENOUS

## 2023-12-27 ENCOUNTER — Encounter: Payer: Self-pay | Admitting: *Deleted

## 2023-12-27 ENCOUNTER — Inpatient Hospital Stay: Attending: Hematology and Oncology

## 2023-12-27 ENCOUNTER — Telehealth: Payer: Self-pay | Admitting: *Deleted

## 2023-12-27 ENCOUNTER — Ambulatory Visit: Payer: Self-pay | Admitting: Hematology and Oncology

## 2023-12-27 ENCOUNTER — Ambulatory Visit (HOSPITAL_COMMUNITY)
Admission: RE | Admit: 2023-12-27 | Discharge: 2023-12-27 | Disposition: A | Discharge status: Home / Self Care | Source: Ambulatory Visit | Attending: Hematology and Oncology | Admitting: Hematology and Oncology

## 2023-12-27 ENCOUNTER — Other Ambulatory Visit: Payer: Self-pay | Admitting: Genetic Counselor

## 2023-12-27 DIAGNOSIS — Z1732 Human epidermal growth factor receptor 2 negative status: Secondary | ICD-10-CM | POA: Insufficient documentation

## 2023-12-27 DIAGNOSIS — Z79899 Other long term (current) drug therapy: Secondary | ICD-10-CM | POA: Insufficient documentation

## 2023-12-27 DIAGNOSIS — Z170 Estrogen receptor positive status [ER+]: Secondary | ICD-10-CM | POA: Diagnosis present

## 2023-12-27 DIAGNOSIS — N6489 Other specified disorders of breast: Secondary | ICD-10-CM | POA: Insufficient documentation

## 2023-12-27 DIAGNOSIS — Z1379 Encounter for other screening for genetic and chromosomal anomalies: Secondary | ICD-10-CM

## 2023-12-27 DIAGNOSIS — Z79810 Long term (current) use of selective estrogen receptor modulators (SERMs): Secondary | ICD-10-CM | POA: Insufficient documentation

## 2023-12-27 DIAGNOSIS — Z1721 Progesterone receptor positive status: Secondary | ICD-10-CM | POA: Insufficient documentation

## 2023-12-27 DIAGNOSIS — C50412 Malignant neoplasm of upper-outer quadrant of left female breast: Secondary | ICD-10-CM | POA: Insufficient documentation

## 2023-12-27 DIAGNOSIS — Z79811 Long term (current) use of aromatase inhibitors: Secondary | ICD-10-CM | POA: Insufficient documentation

## 2023-12-27 DIAGNOSIS — Z8052 Family history of malignant neoplasm of bladder: Secondary | ICD-10-CM | POA: Insufficient documentation

## 2023-12-27 DIAGNOSIS — R911 Solitary pulmonary nodule: Secondary | ICD-10-CM | POA: Insufficient documentation

## 2023-12-27 DIAGNOSIS — Z8042 Family history of malignant neoplasm of prostate: Secondary | ICD-10-CM | POA: Insufficient documentation

## 2023-12-27 DIAGNOSIS — E041 Nontoxic single thyroid nodule: Secondary | ICD-10-CM | POA: Insufficient documentation

## 2023-12-27 DIAGNOSIS — Z833 Family history of diabetes mellitus: Secondary | ICD-10-CM | POA: Insufficient documentation

## 2023-12-27 DIAGNOSIS — Z923 Personal history of irradiation: Secondary | ICD-10-CM | POA: Insufficient documentation

## 2023-12-27 LAB — GENETIC SCREENING ORDER

## 2023-12-27 MED ORDER — SODIUM CHLORIDE (PF) 0.9 % IJ SOLN
INTRAMUSCULAR | Status: AC
  Filled 2023-12-27: qty 50

## 2023-12-27 MED ORDER — IOHEXOL 300 MG/ML  SOLN
100.0000 mL | Freq: Once | INTRAMUSCULAR | Status: AC | PRN
  Administered 2023-12-27: 100 mL via INTRAVENOUS

## 2023-12-27 NOTE — Telephone Encounter (Signed)
 Spoke with patient to follow up from [facility] and assess navigation needs.  Patient denies any questions at this time. Reviewed appts and she is aware of her appt on 6/9 to review scans.

## 2023-12-27 NOTE — Telephone Encounter (Signed)
 Per MD request this RN called pt and scheduled follow up for this Monday 6/9 for review of CTs and to discuss plan of care.  Pt verbalized understanding and agreement to appt.

## 2023-12-31 ENCOUNTER — Inpatient Hospital Stay (HOSPITAL_BASED_OUTPATIENT_CLINIC_OR_DEPARTMENT_OTHER): Admitting: Hematology and Oncology

## 2023-12-31 VITALS — BP 105/73 | HR 94 | Temp 99.0°F | Resp 17 | Wt 128.0 lb

## 2023-12-31 DIAGNOSIS — C50412 Malignant neoplasm of upper-outer quadrant of left female breast: Secondary | ICD-10-CM

## 2023-12-31 DIAGNOSIS — Z923 Personal history of irradiation: Secondary | ICD-10-CM | POA: Diagnosis not present

## 2023-12-31 DIAGNOSIS — Z170 Estrogen receptor positive status [ER+]: Secondary | ICD-10-CM | POA: Diagnosis not present

## 2023-12-31 DIAGNOSIS — Z833 Family history of diabetes mellitus: Secondary | ICD-10-CM | POA: Diagnosis not present

## 2023-12-31 DIAGNOSIS — Z8042 Family history of malignant neoplasm of prostate: Secondary | ICD-10-CM | POA: Diagnosis not present

## 2023-12-31 DIAGNOSIS — Z79899 Other long term (current) drug therapy: Secondary | ICD-10-CM | POA: Diagnosis not present

## 2023-12-31 DIAGNOSIS — Z79811 Long term (current) use of aromatase inhibitors: Secondary | ICD-10-CM | POA: Diagnosis not present

## 2023-12-31 DIAGNOSIS — R911 Solitary pulmonary nodule: Secondary | ICD-10-CM | POA: Diagnosis not present

## 2023-12-31 DIAGNOSIS — E041 Nontoxic single thyroid nodule: Secondary | ICD-10-CM | POA: Diagnosis not present

## 2023-12-31 DIAGNOSIS — Z79810 Long term (current) use of selective estrogen receptor modulators (SERMs): Secondary | ICD-10-CM | POA: Diagnosis not present

## 2023-12-31 DIAGNOSIS — Z8052 Family history of malignant neoplasm of bladder: Secondary | ICD-10-CM | POA: Diagnosis not present

## 2023-12-31 DIAGNOSIS — Z1721 Progesterone receptor positive status: Secondary | ICD-10-CM | POA: Diagnosis not present

## 2023-12-31 DIAGNOSIS — N6489 Other specified disorders of breast: Secondary | ICD-10-CM | POA: Diagnosis not present

## 2023-12-31 DIAGNOSIS — Z1732 Human epidermal growth factor receptor 2 negative status: Secondary | ICD-10-CM | POA: Diagnosis not present

## 2023-12-31 MED ORDER — LETROZOLE 2.5 MG PO TABS: 2.5000 mg | ORAL_TABLET | Freq: Every day | ORAL | 3 refills | Status: DC

## 2023-12-31 NOTE — Progress Notes (Signed)
 [facility] CONSULT NOTE  Patient Care Team: [name], FNP as PCP - General (Family Medicine) [name], MD (Inactive) as Consulting Physician (General Surgery) [name], MD (Inactive) as Consulting Physician (Radiation Oncology) [name], MD as Consulting Physician (Hematology and Oncology) [name], RN as Oncology Nurse Navigator [name], RN as Oncology Nurse Navigator [name], MD as Consulting Physician (General Surgery) [name], MD as Consulting Physician (Radiation Oncology)  CHIEF COMPLAINTS/PURPOSE OF CONSULTATION:  Newly diagnosed breast cancer  HISTORY OF PRESENTING ILLNESS:  Erin Castillo 58 y.o. female is here because of recent diagnosis of left  breast  I reviewed her records extensively and collaborated the history with the patient.  SUMMARY OF ONCOLOGIC HISTORY: Oncology History  Malignant neoplasm of upper-outer quadrant of left breast in female, estrogen receptor positive (HCC)  04/26/2011 Initial Diagnosis   Malignant neoplasm of upper-outer quadrant of left breast in female, estrogen receptor positive (HCC)   11/30/2023 Mammogram   Focal asymmetry with calcifications in the left breast is indeterminate. Additional views with US  is recommended. US  in the breast   12/11/2023 Pathology Results   Left breast needle core biopsy showed invasive poorly differentiated adenocarcinoma grade 3 prognostic showed ER 100% positive strong staining PR 60% positive strong staining Ki-67 of 30% and group 5 HER2 negative    Discussed the use of AI scribe software for clinical note transcription with the patient, who gave verbal consent to proceed.  History of Present Illness Erin Castillo is a 58 year old female with a history of breast cancer who presents for follow up after her recent CT results. This showed a Right lung nodule measuring about 17 mm. Surgery will be post poned until this lung nodule is  investigated further.  She also has a thyroid nodule, which is not currently a major concern, but an ultrasound has been ordered for follow-up. She recalls having an MRI for breast cancer in 2012.  She is postmenopausal, with her last menstrual cycle occurring 12 years ago due to chemotherapy-induced menopause.   MEDICAL HISTORY:  Past Medical History:  Diagnosis Date   Achilles tendonitis    Anxiety    Breast cancer (HCC) 04/20/2011   Left, ER/PR+   Chest pain    Depression    Dyspareunia in female    Dysthymic disorder    GAD (generalized anxiety disorder)    Heel spur, left    History of radiation therapy    09/18/11 to 11/03/11, left breast   Hypercholesterolemia    Hypotension    Insomnia    Migraines    Osteopenia    Postmenopausal atrophic vaginitis    Ulcerative colitis (HCC)     SURGICAL HISTORY: Past Surgical History:  Procedure Laterality Date   BREAST LUMPECTOMY  05/15/11   left breast lumpectomy    CESAREAN SECTION  2000   MASTECTOMY PARTIAL / LUMPECTOMY W/ AXILLARY LYMPHADENECTOMY  10/12   snbx   NASAL SINUS SURGERY  1991   PORT-A-CATH REMOVAL  12/12/2011   Procedure: REMOVAL PORT-A-CATH;  Surgeon: Quitman Bucy, MD;  Location: [facility];  Service: General;  Laterality: Right;   PORTACATH PLACEMENT  06/12/2011   Procedure: INSERTION PORT-A-CATH;  Surgeon: Quitman Bucy, MD;  Location: [facility];  Service: General;  Laterality: Right;  Right Subclavian Vein   SINUS EXPLORATION     remote    SOCIAL HISTORY: Social History   Socioeconomic History   Marital status: Married  Spouse name: Not on file   Number of children: Not on file   Years of education: Not on file   Highest education level: Not on file  Occupational History   Occupation: Print production planner  Tobacco Use   Smoking status: Never   Smokeless tobacco: Never  Substance and Sexual Activity   Alcohol use: Yes    Alcohol/week: 2.0 - 3.0 standard drinks of alcohol     Types: 2 - 3 Glasses of wine per week    Comment: occassionally   Drug use: No   Sexual activity: Not on file  Other Topics Concern   Not on file  Social History Narrative   3 children   Social Drivers of Health   Financial Resource Strain: Not on file  Food Insecurity: No Food Insecurity (12/20/2023)   Hunger Vital Sign    Worried About Running Out of Food in the Last Year: Never true    Ran Out of Food in the Last Year: Never true  Transportation Needs: No Transportation Needs (12/20/2023)   PRAPARE - Administrator, Civil Service (Medical): No    Lack of Transportation (Non-Medical): No  Physical Activity: Not on file  Stress: Not on file  Social Connections: Not on file  Intimate Partner Violence: Not At Risk (12/20/2023)   Humiliation, Afraid, Rape, and Kick questionnaire    Fear of Current or Ex-Partner: No    Emotionally Abused: No    Physically Abused: No    Sexually Abused: No    FAMILY HISTORY: Family History  Problem Relation Age of Onset   Bladder Cancer Father 10   Diabetes Father    Prostate cancer Paternal Grandfather     ALLERGIES:  has no known allergies.  MEDICATIONS:  Current Outpatient Medications  Medication Sig Dispense Refill   letrozole (FEMARA) 2.5 MG tablet Take 1 tablet (2.5 mg total) by mouth daily. 90 tablet 3   b complex vitamins tablet Take 1 tablet by mouth daily.      mesalamine (LIALDA) 1.2 G EC tablet Take 1,200 mg by mouth daily with breakfast.     metoprolol  tartrate (LOPRESSOR ) 100 MG tablet Take 1 tablet (100 mg total) by mouth once for 1 dose. Take 90-120 minutes prior to scan. 1 tablet 0   Multiple Vitamin (MULITIVITAMIN WITH MINERALS) TABS Take 1 tablet by mouth daily.     nitroGLYCERIN  (NITROSTAT ) 0.4 MG SL tablet Place 1 tablet (0.4 mg total) under the tongue every 5 (five) minutes as needed for chest pain. 25 tablet 3   SUMAtriptan (IMITREX) 50 MG tablet Take 50 mg by mouth every 2 (two) hours as needed for  migraine. May repeat in 2 hours if headache persists or recurs.     venlafaxine  XR (EFFEXOR -XR) 37.5 MG 24 hr capsule TAKE 2 CAPSULES (75 MG TOTAL) BY MOUTH DAILY. 180 capsule 2   No current facility-administered medications for this visit.    REVIEW OF SYSTEMS:   Constitutional: Denies fevers, chills or abnormal night sweats Eyes: Denies blurriness of vision, double vision or watery eyes Ears, nose, mouth, throat, and face: Denies mucositis or sore throat Respiratory: Denies cough, dyspnea or wheezes Cardiovascular: Denies palpitation, chest discomfort or lower extremity swelling Gastrointestinal:  Denies nausea, heartburn or change in bowel habits Skin: Denies abnormal skin rashes Lymphatics: Denies new lymphadenopathy or easy bruising Neurological:Denies numbness, tingling or new weaknesses Behavioral/Psych: Mood is stable, no new changes  Breast: Denies any palpable lumps or discharge All other systems were  reviewed with the patient and are negative.  PHYSICAL EXAMINATION: ECOG PERFORMANCE STATUS: 0 - Asymptomatic  Vitals:   12/31/23 1101  BP: 105/73  Pulse: 94  Resp: 17  Temp: 99 F (37.2 C)  SpO2: 100%   Filed Weights   12/31/23 1101  Weight: 128 lb (58.1 kg)    GENERAL:alert, no distress and comfortable   LABORATORY DATA:  I have reviewed the data as listed Lab Results  Component Value Date   WBC 7.5 05/30/2018   HGB 14.2 05/30/2018   HCT 44.6 05/30/2018   MCV 86.9 05/30/2018   PLT 354 05/30/2018   Lab Results  Component Value Date   NA 140 05/30/2018   K 4.1 05/30/2018   CL 105 05/30/2018   CO2 28 05/30/2018    RADIOGRAPHIC STUDIES: I have personally reviewed the radiological reports and agreed with the findings in the report.  ASSESSMENT AND PLAN:  Malignant neoplasm of upper-outer quadrant of left breast in female, estrogen receptor positive (HCC) Assessment and Plan Assessment & Plan Breast cancer with local recurrence vs second  primary? Local recurrence 13 years post initial treatment, high grade, poorly differentiated, ER-positive, high Ki-67 vs second primary. It is nearly impossible to distinguish because this happened almost 13 yrs later. Mastectomy originally planned.  Staging scans however show a suspicious lung nodule to be evaluated further. While we are investigating this, we discussed about starting her on aromatase inhibitors, discussed about letrozole.   - Order PET scan to evaluate for metastasis. - Consult interventional radiology for CT-guided biopsy of lung nodule. - Prescribe letrozole. - Delay surgery until lung nodule evaluation is complete.  Thyroid nodule Thyroid nodule likely benign. - Order thyroid ultrasound.  RTC as scheduled. She expressed understanding of the plan  Time spent: 40 min     All questions were answered. The patient knows to call the clinic with any problems, questions or concerns.    [name], MD 12/31/23

## 2023-12-31 NOTE — Assessment & Plan Note (Signed)
 Assessment and Plan Assessment & Plan Breast cancer with local recurrence vs second primary? Local recurrence 13 years post initial treatment, high grade, poorly differentiated, ER-positive, high Ki-67 vs second primary. It is nearly impossible to distinguish because this happened almost 13 yrs later. Mastectomy originally planned.  Staging scans however show a suspicious lung nodule to be evaluated further. While we are investigating this, we discussed about starting her on aromatase inhibitors, discussed about letrozole.   - Order PET scan to evaluate for metastasis. - Consult interventional radiology for CT-guided biopsy of lung nodule. - Prescribe letrozole. - Delay surgery until lung nodule evaluation is complete.  Thyroid nodule Thyroid nodule likely benign. - Order thyroid ultrasound.  RTC as scheduled. She expressed understanding of the plan  Time spent: 40 min

## 2024-01-01 ENCOUNTER — Encounter: Payer: Self-pay | Admitting: *Deleted

## 2024-01-02 ENCOUNTER — Encounter: Payer: Self-pay | Admitting: Genetic Counselor

## 2024-01-02 ENCOUNTER — Ambulatory Visit (HOSPITAL_COMMUNITY): Admission: RE | Admit: 2024-01-02 | Source: Ambulatory Visit

## 2024-01-02 ENCOUNTER — Telehealth: Payer: Self-pay | Admitting: Genetic Counselor

## 2024-01-02 ENCOUNTER — Ambulatory Visit (HOSPITAL_COMMUNITY)
Admission: RE | Admit: 2024-01-02 | Discharge: 2024-01-02 | Disposition: A | Discharge status: Home / Self Care | Source: Ambulatory Visit | Attending: Hematology and Oncology | Admitting: Hematology and Oncology

## 2024-01-02 ENCOUNTER — Encounter (HOSPITAL_COMMUNITY): Payer: Self-pay

## 2024-01-02 DIAGNOSIS — Z1379 Encounter for other screening for genetic and chromosomal anomalies: Secondary | ICD-10-CM | POA: Insufficient documentation

## 2024-01-02 DIAGNOSIS — C50412 Malignant neoplasm of upper-outer quadrant of left female breast: Secondary | ICD-10-CM | POA: Insufficient documentation

## 2024-01-02 DIAGNOSIS — Z170 Estrogen receptor positive status [ER+]: Secondary | ICD-10-CM | POA: Diagnosis present

## 2024-01-02 LAB — GLUCOSE, CAPILLARY: Glucose-Capillary: 83 mg/dL (ref 70–99)

## 2024-01-02 MED ORDER — FLUDEOXYGLUCOSE F - 18 (FDG) INJECTION
6.0300 | Freq: Once | INTRAVENOUS | Status: AC
  Administered 2024-01-02: 6.03 via INTRAVENOUS

## 2024-01-02 NOTE — Telephone Encounter (Signed)
 I contacted Ms. Erin Castillo to discuss her genetic testing results. No pathogenic variants were identified in the first 13 genes analyzed. Of note, we are still waiting on the pan-cancer panel. Detailed clinic note to follow.  The test report has been scanned into EPIC and is located under the Molecular Pathology section of the Results Review tab.  A portion of the result report is included below for reference.   Harmonee Tozer, MS, [facility] Genetic Counselor [email or web address] (P) [phone]

## 2024-01-03 ENCOUNTER — Telehealth: Payer: Self-pay

## 2024-01-03 NOTE — Telephone Encounter (Signed)
 Verbally confirmed appt 6/13

## 2024-01-03 NOTE — Telephone Encounter (Signed)
 Called pt per MD to let her know MD received PET results and would like to see pt to discuss. She was offered appt for 01/04/24 at 0900 and accepted. Appt scheduled.

## 2024-01-04 ENCOUNTER — Inpatient Hospital Stay (HOSPITAL_BASED_OUTPATIENT_CLINIC_OR_DEPARTMENT_OTHER): Admitting: Hematology and Oncology

## 2024-01-04 ENCOUNTER — Encounter: Payer: Self-pay | Admitting: Hematology and Oncology

## 2024-01-04 ENCOUNTER — Telehealth: Payer: Self-pay | Admitting: Emergency Medicine

## 2024-01-04 ENCOUNTER — Encounter: Payer: Self-pay | Admitting: *Deleted

## 2024-01-04 VITALS — BP 116/75 | HR 88 | Temp 98.4°F | Resp 18 | Ht 64.0 in | Wt 128.5 lb

## 2024-01-04 DIAGNOSIS — C50412 Malignant neoplasm of upper-outer quadrant of left female breast: Secondary | ICD-10-CM | POA: Diagnosis not present

## 2024-01-04 DIAGNOSIS — Z170 Estrogen receptor positive status [ER+]: Secondary | ICD-10-CM

## 2024-01-04 NOTE — Telephone Encounter (Signed)
-----   Message from [place] sent at 01/04/2024  9:36 AM EDT ----- Dr Baldwin Levee  Sorry I have one more patient to discuss with you. She is a very sweet lady initially thought to have local recurrence but now with met disease. We are wondering if you can do biopsy of the mediastinal adenopathy.  Thanks,

## 2024-01-04 NOTE — Progress Notes (Signed)
 [facility] CONSULT NOTE  Patient Care Team: [name], FNP as PCP - General (Family Medicine) [name], MD (Inactive) as Consulting Physician (General Surgery) [name], MD (Inactive) as Consulting Physician (Radiation Oncology) [name], MD as Consulting Physician (Hematology and Oncology) [name], RN as Oncology Nurse Navigator [name], RN as Oncology Nurse Navigator [name], MD as Consulting Physician (General Surgery) [name], MD as Consulting Physician (Radiation Oncology)  CHIEF COMPLAINTS/PURPOSE OF CONSULTATION:  Newly diagnosed breast cancer  HISTORY OF PRESENTING ILLNESS:  Erin Castillo 58 y.o. female is here because of recent diagnosis of left breast cancer  I reviewed her records extensively and collaborated the history with the patient.  SUMMARY OF ONCOLOGIC HISTORY: Oncology History  Malignant neoplasm of upper-outer quadrant of left breast in female, estrogen receptor positive (HCC)  04/26/2011 Initial Diagnosis   Malignant neoplasm of upper-outer quadrant of left breast in female, estrogen receptor positive (HCC)   11/30/2023 Mammogram   Focal asymmetry with calcifications in the left breast is indeterminate. Additional views with US  is recommended. US  in the breast   12/11/2023 Pathology Results   Left breast needle core biopsy showed invasive poorly differentiated adenocarcinoma grade 3 prognostic showed ER 100% positive strong staining PR 60% positive strong staining Ki-67 of 30% and group 5 HER2 negative    Genetic Testing   [facility] BRCAplus was Negative. Report date is 01/02/2024.   The BRCAplus panel offered by [employer] and includes sequencing and deletion/duplication analysis for the following 13 genes: ATM, BARD1, BRCA1, BRCA2, CDH1, CHEK2, NF1, PALB2, PTEN, RAD51C, RAD51D, STK11, and TP53.     Discussed the use of AI scribe software for clinical note transcription with the  patient, who gave verbal consent to proceed.  History of Present Illness [name] is a 58 year old female with a history of breast cancer who presents for follow up after her PET CT scan results. Since her last visit here, she had her PET/CT which showed hypermetabolic right lower lobe pulmonary nodule with hypermetabolic mediastinal and right hilar lymph nodes imaging features compatible with metastatic disease, hypermetabolic left breast lesion, hypermetabolic osseous lesions in the L5 vertebral body posterior right iliac bone and right acetabular roof.  Thyroid nodule once again with low-level uptake.  Suspicion is metastatic breast cancer versus a primary lung with metastatic site. She once again is clinically asymptomatic.  She is now on letrozole  and has been tolerating it well.  No complaints.  She is still considering moving to [facility] end of July.  Rest of the pertinent 10 point ROS reviewed and negative  MEDICAL HISTORY:  Past Medical History:  Diagnosis Date   Achilles tendonitis    Anxiety    Breast cancer (HCC) 04/20/2011   Left, ER/PR+   Chest pain    Depression    Dyspareunia in female    Dysthymic disorder    GAD (generalized anxiety disorder)    Heel spur, left    History of radiation therapy    09/18/11 to 11/03/11, left breast   Hypercholesterolemia    Hypotension    Insomnia    Migraines    Osteopenia    Postmenopausal atrophic vaginitis    Ulcerative colitis (HCC)     SURGICAL HISTORY: Past Surgical History:  Procedure Laterality Date   BREAST LUMPECTOMY  05/15/11   left breast lumpectomy    CESAREAN SECTION  2000   MASTECTOMY PARTIAL / LUMPECTOMY W/ AXILLARY LYMPHADENECTOMY  10/12  snbx   NASAL SINUS SURGERY  1991   PORT-A-CATH REMOVAL  12/12/2011   Procedure: REMOVAL PORT-A-CATH;  Surgeon: Quitman Bucy, MD;  Location: [facility];  Service: General;  Laterality: Right;   PORTACATH PLACEMENT  06/12/2011   Procedure: INSERTION PORT-A-CATH;   Surgeon: Quitman Bucy, MD;  Location: [facility];  Service: General;  Laterality: Right;  Right Subclavian Vein   SINUS EXPLORATION     remote    SOCIAL HISTORY: Social History   Socioeconomic History   Marital status: Married    Spouse name: Not on file   Number of children: Not on file   Years of education: Not on file   Highest education level: Not on file  Occupational History   Occupation: office manager  Tobacco Use   Smoking status: Never   Smokeless tobacco: Never  Substance and Sexual Activity   Alcohol use: Yes    Alcohol/week: 2.0 - 3.0 standard drinks of alcohol    Types: 2 - 3 Glasses of wine per week    Comment: occassionally   Drug use: No   Sexual activity: Not on file  Other Topics Concern   Not on file  Social History Narrative   3 children   Social Drivers of Health   Financial Resource Strain: Not on file  Food Insecurity: No Food Insecurity (12/20/2023)   Hunger Vital Sign    Worried About Running Out of Food in the Last Year: Never true    Ran Out of Food in the Last Year: Never true  Transportation Needs: No Transportation Needs (12/20/2023)   PRAPARE - Administrator, Civil Service (Medical): No    Lack of Transportation (Non-Medical): No  Physical Activity: Not on file  Stress: Not on file  Social Connections: Not on file  Intimate Partner Violence: Not At Risk (12/20/2023)   Humiliation, Afraid, Rape, and Kick questionnaire    Fear of Current or Ex-Partner: No    Emotionally Abused: No    Physically Abused: No    Sexually Abused: No    FAMILY HISTORY: Family History  Problem Relation Age of Onset   Bladder Cancer Father 34   Diabetes Father    Prostate cancer Paternal Grandfather     ALLERGIES:  has no known allergies.  MEDICATIONS:  Current Outpatient Medications  Medication Sig Dispense Refill   b complex vitamins tablet Take 1 tablet by mouth daily.      letrozole  (FEMARA ) 2.5 MG tablet Take  1 tablet (2.5 mg total) by mouth daily. 90 tablet 3   mesalamine (LIALDA) 1.2 G EC tablet Take 1,200 mg by mouth daily with breakfast.     metoprolol  tartrate (LOPRESSOR ) 100 MG tablet Take 1 tablet (100 mg total) by mouth once for 1 dose. Take 90-120 minutes prior to scan. 1 tablet 0   Multiple Vitamin (MULITIVITAMIN WITH MINERALS) TABS Take 1 tablet by mouth daily.     nitroGLYCERIN  (NITROSTAT ) 0.4 MG SL tablet Place 1 tablet (0.4 mg total) under the tongue every 5 (five) minutes as needed for chest pain. 25 tablet 3   SUMAtriptan (IMITREX) 50 MG tablet Take 50 mg by mouth every 2 (two) hours as needed for migraine. May repeat in 2 hours if headache persists or recurs.     venlafaxine  XR (EFFEXOR -XR) 37.5 MG 24 hr capsule TAKE 2 CAPSULES (75 MG TOTAL) BY MOUTH DAILY. 180 capsule 2   No current facility-administered medications for this visit.  REVIEW OF SYSTEMS:   Constitutional: Denies fevers, chills or abnormal night sweats Eyes: Denies blurriness of vision, double vision or watery eyes Ears, nose, mouth, throat, and face: Denies mucositis or sore throat Respiratory: Denies cough, dyspnea or wheezes Cardiovascular: Denies palpitation, chest discomfort or lower extremity swelling Gastrointestinal:  Denies nausea, heartburn or change in bowel habits Skin: Denies abnormal skin rashes Lymphatics: Denies new lymphadenopathy or easy bruising Neurological:Denies numbness, tingling or new weaknesses Behavioral/Psych: Mood is stable, no new changes  Breast: Denies any palpable lumps or discharge All other systems were reviewed with the patient and are negative.  PHYSICAL EXAMINATION: ECOG PERFORMANCE STATUS: 0 - Asymptomatic  Vitals:   01/04/24 0903  BP: 116/75  Pulse: 88  Resp: 18  Temp: 98.4 F (36.9 C)  SpO2: 100%    Filed Weights   01/04/24 0903  Weight: 128 lb 8 oz (58.3 kg)     GENERAL:alert, no distress and comfortable   LABORATORY DATA:  I have reviewed the data  as listed Lab Results  Component Value Date   WBC 7.5 05/30/2018   HGB 14.2 05/30/2018   HCT 44.6 05/30/2018   MCV 86.9 05/30/2018   PLT 354 05/30/2018   Lab Results  Component Value Date   NA 140 05/30/2018   K 4.1 05/30/2018   CL 105 05/30/2018   CO2 28 05/30/2018    RADIOGRAPHIC STUDIES: I have personally reviewed the radiological reports and agreed with the findings in the report.  ASSESSMENT AND PLAN:   58 y.o. BRCA negative [place] woman    (1) Status post left lumpectomy and sentinel lymph node biopsy October of 2012 for a T2 N1, stage IIB invasive ductal carcinoma, grade 2,  strongly estrogen and progesterone receptor positive, with an MIB-1 of 9% and no HER-2/neu amplification.    (2) Treated initially with docetaxel , doxorubicin , and cyclophosphamide  for one cycle, with very poor tolerance   (3) status post cyclophosphamide / docetaxel , for an additional three cycles completed January 2013   (4)  Status post radiation, completed mid-April 2013   (5)  on tamoxifen  as of April 2013-- switched to anastrozole  March 2018, to be discontinued February 2020             (a) bone density January 2019 shows a T score of -1.7  (6) in May 2025 she had a mammogram and an ultrasound which showed a 2 x 1.7 x 2 cm irregular mass highly suggestive of malignancy.  This showed invasive poorly differentiated adenocarcinoma grade 3, ER 100% positive strong staining PR 60% positive strong staining, Ki-67 of 30% group 5 HER2 negative  (7) we did staging scans given concern for local recurrence which showed right lower lobe lung nodule highly suspicious for metastatic disease versus a second primary.  Hence we proceeded with the PET/CT which showed multiple sites concerning for metastatic disease including mediastinal adenopathy, lung, bone disease.   She is now here for follow-up to discuss further plans.  At this time given concern for metastatic breast cancer, we will hold surgery.  We  have discussed about considering metastatic site biopsy, I will discuss with Dr. Baldwin Levee if her mediastinal lymphadenopathy is amenable to biopsy.  Will repeat receptor status on the biopsy.  In the interim she will continue letrozole , she is tolerating it well.  If this metastatic disease is indeed consistent with breast cancer and is still ER/PR positive, we will add CDK 4 6 inhibition, Verzenio to the letrozole .  She will also need bisphosphonates  given the bone involvement.  She recently has seen her dentist and will try to fax a dental clearance to her dentist.  Zometa every 12 weeks will be recommended.  She can continue this in [place] as well.  She understands the disease is categorized as stage IV and the intent of treatment is palliative however there are many treatment options which could and provide significant quality and quality of life.  All her questions were answered to the best of my knowledge.  Thank you for consulting us  in the care of this patient.  Please do not hesitate to contact us  with any additional questions or concerns    All questions were answered. The patient knows to call the clinic with any problems, questions or concerns.    [name], MD 01/04/24

## 2024-01-07 ENCOUNTER — Telehealth: Payer: Self-pay | Admitting: Genetic Counselor

## 2024-01-07 NOTE — Telephone Encounter (Signed)
 I contacted Ms. Magro to discuss her genetic testing results. No pathogenic variants were identified in the 77 genes analyzed. Detailed clinic note to follow.  The test report has been scanned into EPIC and is located under the Molecular Pathology section of the Results Review tab.  A portion of the result report is included below for reference.   Denaisha Swango, MS, [facility] Genetic Counselor [email or web address] (P) [phone]

## 2024-01-07 NOTE — Telephone Encounter (Signed)
 Would like to get this patient into be seen by RB, SG or any APP for an abnormal CT chest

## 2024-01-07 NOTE — Telephone Encounter (Signed)
 ATC x1. Left detailed vm per dpr.  Routing to front staff to schedule patient for abnormal CT review. BW has slot for 7/15 & 7/16.

## 2024-01-07 NOTE — Telephone Encounter (Signed)
 Patient scheduled for tomorrow with SG.

## 2024-01-08 ENCOUNTER — Encounter: Payer: Self-pay | Admitting: Emergency Medicine

## 2024-01-08 ENCOUNTER — Ambulatory Visit (INDEPENDENT_AMBULATORY_CARE_PROVIDER_SITE_OTHER): Admitting: Acute Care

## 2024-01-08 ENCOUNTER — Encounter: Payer: Self-pay | Admitting: Acute Care

## 2024-01-08 VITALS — BP 108/77 | HR 86 | Ht 64.0 in | Wt 129.0 lb

## 2024-01-08 DIAGNOSIS — Z789 Other specified health status: Secondary | ICD-10-CM

## 2024-01-08 DIAGNOSIS — Z853 Personal history of malignant neoplasm of breast: Secondary | ICD-10-CM | POA: Diagnosis not present

## 2024-01-08 DIAGNOSIS — R911 Solitary pulmonary nodule: Secondary | ICD-10-CM | POA: Diagnosis not present

## 2024-01-08 NOTE — Progress Notes (Addendum)
 History of Present Illness Erin Castillo is a 58 y.o. female never smoker referred for consideration of bronchoscopy with biopsies for hypermetabolic right lower lobe pulmonary nodule and hypermetabolic mediastinal and right hilar lymph nodes in setting of recurrent breast cancer. She will be followed by Dr. Baldwin Levee.  Synopsis   (1) Status post left lumpectomy and sentinel lymph node biopsy October of 2012 for a T2 N1, stage IIB invasive ductal carcinoma, grade 2,  strongly estrogen and progesterone receptor positive, with an MIB-1 of 9% and no HER-2/neu amplification.    (2) Treated initially with docetaxel , doxorubicin , and cyclophosphamide  for one cycle, with very poor tolerance   (3) status post cyclophosphamide / docetaxel , for an additional three cycles completed January 2013   (4)  Status post radiation, completed mid-April 2013   (5)  on tamoxifen  as of April 2013-- switched to anastrozole  March 2018, to be discontinued February 2020             (a) bone density January 2019 shows a T score of -1.7   (6) in May 2025 she had a mammogram and an ultrasound which showed a 2 x 1.7 x 2 cm irregular mass highly suggestive of malignancy.  This showed invasive poorly differentiated adenocarcinoma grade 3, ER 100% positive strong staining PR 60% positive strong staining, Ki-67 of 30% group 5 HER2 negative   (7) we did staging scans given concern for local recurrence which showed right lower lobe lung nodule highly suspicious for metastatic disease versus a second primary.  Hence we proceeded with the PET/CT which showed multiple sites concerning for metastatic disease including mediastinal adenopathy, lung, bone disease.  Family history of bladder cancer in her father, and prostate cancer in her paternal grandfather.   Pt. Has consented to use of [employer] soft wear to help capture the content of this OV    01/08/2024 Pt. Presents with her husband for consult for consideration of bronchoscopy with  biopsies.She has a history of left breast cancer in 2012. She underwent radiation therapy to the left breast 09/18/2011-11/02/2021. Mammogram done 11/2023 asymmetric calcifications of the left breast. Biopsy was done 11/2023 which showed invasive poorly differentiated adenocarcinoma grade 3 . PET scan ws done to complete staging, and showed a hypermetabolic right lower lobe pulmonary nodule with hypermetabolic mediastinal and right hilar lymph nodes imaging features compatible with metastatic disease, hypermetabolic left breast lesion, hypermetabolic osseous lesions in the L5 vertebral body posterior right iliac bone and right acetabular roof. Thyroid nodule once again with low-level uptake.   Suspicion is metastatic breast cancer versus a primary lung with metastatic site including mediastinal adenopathy, lung, bone disease . She is clinically asymptomatic.   We have discussed the process of biopsy of the lung nodule as well as the hilum/mediastinal lymph node. We discussed risks of the procedure.  Pt. And her husband have agreed to move forward with biopsy.   Test Results: PET scan 01/03/2024 While the findings described in the report are likely metastatic disease from known breast cancer, the possibility of a primary right lung cancer with metastatic spread to the hilum/mediastinum and bones is not excluded.  12/11/2023 Pathology Results    Left breast needle core biopsy showed invasive poorly differentiated adenocarcinoma grade 3 prognostic showed ER 100% positive strong staining PR 60% positive strong staining Ki-67 of 30% and group 5 HER2 negative        Latest Ref Rng & Units 05/30/2018    1:49 PM 07/20/2017    9:30 AM 09/07/2016  1:46 PM  CBC  WBC 4.0 - 10.5 K/uL 7.5  4.7  5.5   Hemoglobin 12.0 - 15.0 g/dL 16.1  09.6  04.5   Hematocrit 36.0 - 46.0 % 44.6  45.0  44.6   Platelets 150 - 400 K/uL 354  256  233        Latest Ref Rng & Units 05/30/2018    1:49 PM 07/20/2017    9:30 AM  09/07/2016    1:46 PM  BMP  Glucose 70 - 99 mg/dL 97  79  84   BUN 6 - 20 mg/dL 15  40.9  81.1   Creatinine 0.44 - 1.00 mg/dL 9.14  0.8  0.7   Sodium 135 - 145 mmol/L 140  142  141   Potassium 3.5 - 5.1 mmol/L 4.1  3.7  3.6   Chloride 98 - 111 mmol/L 105     CO2 22 - 32 mmol/L 28  28  29    Calcium 8.9 - 10.3 mg/dL 9.5  9.0  9.6     BNP No results found for: BNP  ProBNP No results found for: PROBNP  PFT No results found for: FEV1PRE, FEV1POST, FVCPRE, FVCPOST, TLC, DLCOUNC, PREFEV1FVCRT, PSTFEV1FVCRT  NM PET Image Initial (PI) Skull Base To Thigh Addendum Date: 01/03/2024 ADDENDUM REPORT: 01/03/2024 08:28 ADDENDUM: While the findings described in the report are likely metastatic disease from known breast cancer, the possibility of a primary right lung cancer with metastatic spread to the hilum/mediastinum and bones is not excluded. Electronically Signed   By: Donnal Fusi M.D.   On: 01/03/2024 08:28   Result Date: 01/03/2024 CLINICAL DATA:  Subsequent treatment strategy for breast cancer. 17 mm right lower lobe pulmonary nodule on recent CT. EXAM: NUCLEAR MEDICINE PET SKULL BASE TO THIGH TECHNIQUE: 6.0 mCi F-18 FDG was injected intravenously. Full-ring PET imaging was performed from the skull base to thigh after the radiotracer. CT data was obtained and used for attenuation correction and anatomic localization. Fasting blood glucose: 83 mg/dl COMPARISON:  Chest abdomen pelvis CT 12/27/2023. FINDINGS: Mediastinal blood pool activity: SUV max 2.0 Liver activity: SUV max NA NECK: No hypermetabolic lymph nodes in the neck. Incidental CT findings: None. CHEST: 2.4 cm right thyroid nodule with only low level FDG uptake ( SUV max = 3.3). Smaller left thyroid nodule evident. Hypermetabolic mediastinal lymph nodes evident including 9 mm short axis precarinal lymph node on 60/4 ( SUV max = 11.3).r 10 mm short axis subcarinal lymph node demonstrates SUV max = 7.9. Right hilar lymph  nodes are hypermetabolic with SUV max = 12.4. Right lower lobe nodule of concern on previous CT is hypermetabolic with SUV max = 14.9. Left breast lesion is hypermetabolic with SUV max = 10.9. Incidental CT findings: No pericardial or pleural effusion. ABDOMEN/PELVIS: No abnormal hypermetabolic activity within the liver, pancreas, adrenal glands, or spleen. No hypermetabolic lymph nodes in the abdomen or pelvis. Incidental CT findings: There is mild atherosclerotic calcification of the abdominal aorta without aneurysm. SKELETON: Intensely hypermetabolic lesion in the left aspect of the L5 vertebral body demonstrates SUV max = 19.6. Hypermetabolic lesion posterior right iliac bone shows SUV max = 10.9. Subtle hypermetabolic lesion in the right acetabular roof with SUV max = 4.9. Incidental CT findings: None. IMPRESSION: 1. Hypermetabolic right lower lobe pulmonary nodule with hypermetabolic mediastinal and right hilar lymph nodes. Imaging features compatible with metastatic disease. 2. Hypermetabolic left breast lesion compatible with known breast cancer. 3. Hypermetabolic osseous lesions in the L5 vertebral body,  posterior right iliac bone, and right acetabular roof compatible with osseous metastatic disease. 4. 2.4 cm right thyroid nodule with only low level FDG uptake. Recommend thyroid US  (ref: J Am Coll Radiol. 2015 Feb;12(2): 143-50). 5.  Aortic Atherosclerosis (ICD10-I70.0). Electronically Signed: By: Donnal Fusi M.D. On: 01/03/2024 08:20   CT Chest W Contrast Result Date: 12/27/2023 EXAMINATION: CT ABDOMEN PELVIS W CONTRAST (accession [id number] [facility]), CT CHEST W CONTRAST (accession [id number] [facility]) CLINICAL INDICATION: Female, 59 years old. Breast cancer, invasive, stage I/II/III, initial workup; new recurrence breast cancer TECHNIQUE: Axial CT of the chest, abdomen and pelvis with 100 cc Omnipaque  300 intravenous contrast. Multiplanar reformations provided. Unless otherwise specified, incidental thyroid,  adrenal, renal lesions do not require dedicated imaging follow up. Additionally, any mentioned pulmonary nodules do not require dedicated imaging follow-up based on the Fleischner guidelines unless otherwise specified. Coronary calcifications are not identified unless otherwise specified. COMPARISON: FINDINGS: There is a mass noted in the left breast measuring 2.5 cm. There is a 2.4 cm right thyroid lobe nodule. The thoracic aorta is normal in caliber. The main pulmonary artery is normal in caliber. The heart is normal in size. There is no free fluid or pathologic lymphadenopathy by size criteria. The trachea and mainstem bronchi are patent. There is a right lower lobe pulmonary nodule measuring 17 mm (series 2 image 40). The liver appears normal other than a subcentimeter probable cyst. The gallbladder is normal. The spleen is normal. The pancreas is normal. Adrenals are normal. The kidneys appear normal. The abdominal aorta is normal in caliber. Scattered atherosclerotic changes are present. The bladder is normal. The uterus is normal. There is colonic diverticulosis. Large and small bowel loops are otherwise within normal limits. No free fluid or adenopathy. No concerning osseous lesions are identified. There are mild degenerative changes of the spine and bony pelvis. IMPRESSION: Left breast mass concerning for the patient's primary neoplasm. 17 mm right lower lobe pulmonary nodule worrisome for metastatic disease. PET/CT is suggested for further evaluation. 2.4 cm nodule in the right thyroid lobe. Dedicated thyroid ultrasound is suggested for further evaluation. DOSE REDUCTION: This exam was performed according to our departmental dose-optimization program which includes automated exposure control, adjustment of the mA and/or kV according to patient size and/or use of iterative reconstruction technique. Electronically signed by: [place] Engel MD 12/27/2023 12:13 PM EDT RP Workstation: [id number]   CT ABDOMEN  PELVIS W CONTRAST Result Date: 12/27/2023 EXAMINATION: CT ABDOMEN PELVIS W CONTRAST (accession [id number] [facility]), CT CHEST W CONTRAST (accession [id number] [facility]) CLINICAL INDICATION: Female, 58 years old. Breast cancer, invasive, stage I/II/III, initial workup; new recurrence breast cancer TECHNIQUE: Axial CT of the chest, abdomen and pelvis with 100 cc Omnipaque  300 intravenous contrast. Multiplanar reformations provided. Unless otherwise specified, incidental thyroid, adrenal, renal lesions do not require dedicated imaging follow up. Additionally, any mentioned pulmonary nodules do not require dedicated imaging follow-up based on the Fleischner guidelines unless otherwise specified. Coronary calcifications are not identified unless otherwise specified. COMPARISON: FINDINGS: There is a mass noted in the left breast measuring 2.5 cm. There is a 2.4 cm right thyroid lobe nodule. The thoracic aorta is normal in caliber. The main pulmonary artery is normal in caliber. The heart is normal in size. There is no free fluid or pathologic lymphadenopathy by size criteria. The trachea and mainstem bronchi are patent. There is a right lower lobe pulmonary nodule measuring 17 mm (series 2 image 40). The liver appears normal other than a subcentimeter probable cyst. The gallbladder is normal. The spleen  is normal. The pancreas is normal. Adrenals are normal. The kidneys appear normal. The abdominal aorta is normal in caliber. Scattered atherosclerotic changes are present. The bladder is normal. The uterus is normal. There is colonic diverticulosis. Large and small bowel loops are otherwise within normal limits. No free fluid or adenopathy. No concerning osseous lesions are identified. There are mild degenerative changes of the spine and bony pelvis. IMPRESSION: Left breast mass concerning for the patient's primary neoplasm. 17 mm right lower lobe pulmonary nodule worrisome for metastatic disease. PET/CT is suggested for further  evaluation. 2.4 cm nodule in the right thyroid lobe. Dedicated thyroid ultrasound is suggested for further evaluation. DOSE REDUCTION: This exam was performed according to our departmental dose-optimization program which includes automated exposure control, adjustment of the mA and/or kV according to patient size and/or use of iterative reconstruction technique. Electronically signed by: [place] Engel MD 12/27/2023 12:13 PM EDT RP Workstation: [id number]   NM Bone Scan Whole Body Result Date: 12/26/2023 CLINICAL DATA:  Breast cancer.  Evaluate for skeletal metastasis EXAM: NUCLEAR MEDICINE WHOLE BODY BONE SCAN TECHNIQUE: Whole body anterior and posterior images were obtained approximately 3 hours after intravenous injection of radiopharmaceutical. RADIOPHARMACEUTICALS:  20.6 mCi Technetium-16m MDP IV COMPARISON:  None FINDINGS: On posterior planar imaging there is focal uptake in the LEFT cervical spine which is atypical location for degenerative disc disease. No focal uptake within the axillary or appendicular skeleton to localize breast carcinoma. IMPRESSION: No scintigraphic evidence of skeletal metastasis. Electronically Signed   By: Deboraha Fallow M.D.   On: 12/26/2023 15:14     Past medical hx Past Medical History:  Diagnosis Date   Achilles tendonitis    Anxiety    Breast cancer (HCC) 04/20/2011   Left, ER/PR+   Chest pain    Depression    Dyspareunia in female    Dysthymic disorder    GAD (generalized anxiety disorder)    Heel spur, left    History of radiation therapy    09/18/11 to 11/03/11, left breast   Hypercholesterolemia    Hypotension    Insomnia    Migraines    Osteopenia    Postmenopausal atrophic vaginitis    Ulcerative colitis (HCC)      Social History   Tobacco Use   Smoking status: Never    Passive exposure: Never   Smokeless tobacco: Never  Substance Use Topics   Alcohol use: Yes    Alcohol/week: 2.0 - 3.0 standard drinks of alcohol    Types: 2 - 3 Glasses  of wine per week    Comment: occassionally   Drug use: No    Ms.Nosbisch reports that she has never smoked. She has never been exposed to tobacco smoke. She has never used smokeless tobacco. She reports current alcohol use of about 2.0 - 3.0 standard drinks of alcohol per week. She reports that she does not use drugs.  Tobacco Cessation: Never smoker    Past surgical hx, Family hx, Social hx all reviewed.  Current Outpatient Medications on File Prior to Visit  Medication Sig   b complex vitamins tablet Take 1 tablet by mouth daily.    Cholecalciferol (VITAMIN D3) 25 MCG (1000 UT) CAPS 1 capsule.   clobetasol cream (TEMOVATE) 0.05 % Apply 1 Application topically.   letrozole  (FEMARA ) 2.5 MG tablet Take 1 tablet (2.5 mg total) by mouth daily.   mesalamine (LIALDA) 1.2 G EC tablet Take 1,200 mg by mouth daily with breakfast.   Multiple Vitamin (MULITIVITAMIN WITH  MINERALS) TABS Take 1 tablet by mouth daily.   nitroGLYCERIN  (NITROSTAT ) 0.4 MG SL tablet Place 1 tablet (0.4 mg total) under the tongue every 5 (five) minutes as needed for chest pain.   SUMAtriptan (IMITREX) 50 MG tablet Take 50 mg by mouth every 2 (two) hours as needed for migraine. May repeat in 2 hours if headache persists or recurs.   venlafaxine  XR (EFFEXOR -XR) 37.5 MG 24 hr capsule TAKE 2 CAPSULES (75 MG TOTAL) BY MOUTH DAILY.   No current facility-administered medications on file prior to visit.     No Known Allergies  Review Of Systems:  Constitutional:   No  weight loss, night sweats,  Fevers, chills, fatigue, or  lassitude.  HEENT:   No headaches,  Difficulty swallowing,  Tooth/dental problems, or  Sore throat,                No sneezing, itching, ear ache, nasal congestion, post nasal drip,   CV:  No chest pain,  Orthopnea, PND, swelling in lower extremities, anasarca, dizziness, palpitations, syncope.   GI  No heartburn, indigestion, abdominal pain, nausea, vomiting, diarrhea, change in bowel habits, loss of  appetite, bloody stools.   Resp: No shortness of breath with exertion or at rest.  No excess mucus, no productive cough,  No non-productive cough,  No coughing up of blood.  No change in color of mucus.  No wheezing.  No chest wall deformity  Skin: no rash or lesions.  GU: no dysuria, change in color of urine, no urgency or frequency.  No flank pain, no hematuria   MS:  No joint pain or swelling.  No decreased range of motion.  No back pain.  Psych:  No change in mood or affect. No depression or anxiety.  No memory loss.   Vital Signs BP 108/77 (BP Location: Right Arm, Patient Position: Sitting, Cuff Size: Normal)   Pulse 86   Ht 5' 4 (1.626 m)   Wt 129 lb (58.5 kg)   SpO2 99%   BMI 22.14 kg/m    Physical Exam:  General- No distress,  A&Ox3, pleasant ENT: No sinus tenderness, TM clear, pale nasal mucosa, no oral exudate,no post nasal drip, no LAN Cardiac: S1, S2, regular rate and rhythm, no murmur Chest: No wheeze/ rales/ dullness; no accessory muscle use, no nasal flaring, no sternal retractions Abd.: Soft Non-tender, ND, BS +, Body mass index is 22.14 kg/m.  Ext: No clubbing cyanosis, edema, no obvious deformities Neuro:  normal strength, MAE x 4, A&O  x 3 Skin: No rashes, warm and dry, no obvious skin lesions  Psych: normal mood and behavior   Assessment/Plan Hypermetabolic lung nodule and lymph nodes in setting of recurrent breast cancer. Never smoker  Plan I have placed an order for a bronchoscopy with biopsies of the right lung nodule and lymph nodes.  We have discussed the procedure in detail.  We have reviewed the risks and benefits of the procedure. These include bleeding, infection, puncture of the lung, and adverse reaction to anesthesia. You have agreed to proceed with biopsy to evaluate the nodule of concern Your procedure will be done by Dr. Racheal Buddle. You will receive a letter today with date time and information pertaining to the procedure. You will  need someone to drive you to the procedure, stay with you during the procedure, and stay with you after the procedure. You will also need someone to stay with you for 24 hours after anesthesia to ensure you have  cleared and are doing well. You will follow-up with me 1 week after the procedure to review the results and to ensure you are doing well. Call if you need us  prior to the procedure or if you have any questions at all. Please contact office for sooner follow up if symptoms do not improve or worsen or seek emergency care      I spent 30 minutes dedicated to the care of this patient on the date of this encounter to include pre-visit review of records, face-to-face time with the patient discussing conditions above, post visit ordering of testing, clinical documentation with the electronic health record, making appropriate referrals as documented, and communicating necessary information to the patient's healthcare team.    Raejean Bullock, NP 01/08/2024  11:13 AM   The following has been scheduled for you:   Procedure: Bronchoscopy                 Surgeon:  Dr Baldwin Levee    Procedure date:  01/15/24 at 9:15 am  Arrival Time:  6:45 am Location: [facility] [address] Entr[address] [place], [place] [address]   Additional Information: Do not eat anything after midnight - ok to take medications on the day of procedure with only sips of water You will need someone to drive you home after this procedure & to be with you for the next 24 hours You may receive a call from the hospital a day or two prior to procedure to go over additional instructions & medications  NEXT VISIT:  01/21/24 at 1:30 pm With Dara Ear, NP at    [facility]    [address], [address] [place], [place]  [address]

## 2024-01-08 NOTE — Patient Instructions (Signed)
 It is good to see you today. I have placed an order for a bronchoscopy with biopsies of the right lung nodule and lymph nodes.  We have discussed the procedure in detail.  We have reviewed the risks and benefits of the procedure. These include bleeding, infection, puncture of the lung, and adverse reaction to anesthesia. You have agreed to proceed with biopsy to evaluate the nodule of concern Your procedure will be done by Dr. Racheal Buddle. You will receive a letter today with date time and information pertaining to the procedure. You will need someone to drive you to the procedure, stay with you during the procedure, and stay with you after the procedure. You will also need someone to stay with you for 24 hours after anesthesia to ensure you have cleared and are doing well. You will follow-up with me 1 week after the procedure to review the results and to ensure you are doing well. Call if you need us  prior to the procedure or if you have any questions at all. Please contact office for sooner follow up if symptoms do not improve or worsen or seek emergency care

## 2024-01-08 NOTE — H&P (View-Only) (Signed)
 History of Present Illness Erin Castillo is a 58 y.o. female never smoker referred for consideration of bronchoscopy with biopsies for hypermetabolic right lower lobe pulmonary nodule and hypermetabolic mediastinal and right hilar lymph nodes in setting of recurrent breast cancer. She will be followed by Dr. Baldwin Levee.  Synopsis   (1) Status post left lumpectomy and sentinel lymph node biopsy October of 2012 for a T2 N1, stage IIB invasive ductal carcinoma, grade 2,  strongly estrogen and progesterone receptor positive, with an MIB-1 of 9% and no HER-2/neu amplification.    (2) Treated initially with docetaxel , doxorubicin , and cyclophosphamide  for one cycle, with very poor tolerance   (3) status post cyclophosphamide / docetaxel , for an additional three cycles completed January 2013   (4)  Status post radiation, completed mid-April 2013   (5)  on tamoxifen  as of April 2013-- switched to anastrozole  March 2018, to be discontinued February 2020             (a) bone density January 2019 shows a T score of -1.7   (6) in May 2025 she had a mammogram and an ultrasound which showed a 2 x 1.7 x 2 cm irregular mass highly suggestive of malignancy.  This showed invasive poorly differentiated adenocarcinoma grade 3, ER 100% positive strong staining PR 60% positive strong staining, Ki-67 of 30% group 5 HER2 negative   (7) we did staging scans given concern for local recurrence which showed right lower lobe lung nodule highly suspicious for metastatic disease versus a second primary.  Hence we proceeded with the PET/CT which showed multiple sites concerning for metastatic disease including mediastinal adenopathy, lung, bone disease.  Family history of bladder cancer in her father, and prostate cancer in her paternal grandfather.   Pt. Has consented to use of [employer] soft wear to help capture the content of this OV    01/08/2024 Pt. Presents with her husband for consult for consideration of bronchoscopy with  biopsies.She has a history of left breast cancer in 2012. She underwent radiation therapy to the left breast 09/18/2011-11/02/2021. Mammogram done 11/2023 asymmetric calcifications of the left breast. Biopsy was done 11/2023 which showed invasive poorly differentiated adenocarcinoma grade 3 . PET scan ws done to complete staging, and showed a hypermetabolic right lower lobe pulmonary nodule with hypermetabolic mediastinal and right hilar lymph nodes imaging features compatible with metastatic disease, hypermetabolic left breast lesion, hypermetabolic osseous lesions in the L5 vertebral body posterior right iliac bone and right acetabular roof. Thyroid nodule once again with low-level uptake.   Suspicion is metastatic breast cancer versus a primary lung with metastatic site including mediastinal adenopathy, lung, bone disease . She is clinically asymptomatic.   We have discussed the process of biopsy of the lung nodule as well as the hilum/mediastinal lymph node. We discussed risks of the procedure.  Pt. And her husband have agreed to move forward with biopsy.   Test Results: PET scan 01/03/2024 While the findings described in the report are likely metastatic disease from known breast cancer, the possibility of a primary right lung cancer with metastatic spread to the hilum/mediastinum and bones is not excluded.  12/11/2023 Pathology Results    Left breast needle core biopsy showed invasive poorly differentiated adenocarcinoma grade 3 prognostic showed ER 100% positive strong staining PR 60% positive strong staining Ki-67 of 30% and group 5 HER2 negative        Latest Ref Rng & Units 05/30/2018    1:49 PM 07/20/2017    9:30 AM 09/07/2016  1:46 PM  CBC  WBC 4.0 - 10.5 K/uL 7.5  4.7  5.5   Hemoglobin 12.0 - 15.0 g/dL 16.1  09.6  04.5   Hematocrit 36.0 - 46.0 % 44.6  45.0  44.6   Platelets 150 - 400 K/uL 354  256  233        Latest Ref Rng & Units 05/30/2018    1:49 PM 07/20/2017    9:30 AM  09/07/2016    1:46 PM  BMP  Glucose 70 - 99 mg/dL 97  79  84   BUN 6 - 20 mg/dL 15  40.9  81.1   Creatinine 0.44 - 1.00 mg/dL 9.14  0.8  0.7   Sodium 135 - 145 mmol/L 140  142  141   Potassium 3.5 - 5.1 mmol/L 4.1  3.7  3.6   Chloride 98 - 111 mmol/L 105     CO2 22 - 32 mmol/L 28  28  29    Calcium 8.9 - 10.3 mg/dL 9.5  9.0  9.6     BNP No results found for: BNP  ProBNP No results found for: PROBNP  PFT No results found for: FEV1PRE, FEV1POST, FVCPRE, FVCPOST, TLC, DLCOUNC, PREFEV1FVCRT, PSTFEV1FVCRT  NM PET Image Initial (PI) Skull Base To Thigh Addendum Date: 01/03/2024 ADDENDUM REPORT: 01/03/2024 08:28 ADDENDUM: While the findings described in the report are likely metastatic disease from known breast cancer, the possibility of a primary right lung cancer with metastatic spread to the hilum/mediastinum and bones is not excluded. Electronically Signed   By: Donnal Fusi M.D.   On: 01/03/2024 08:28   Result Date: 01/03/2024 CLINICAL DATA:  Subsequent treatment strategy for breast cancer. 17 mm right lower lobe pulmonary nodule on recent CT. EXAM: NUCLEAR MEDICINE PET SKULL BASE TO THIGH TECHNIQUE: 6.0 mCi F-18 FDG was injected intravenously. Full-ring PET imaging was performed from the skull base to thigh after the radiotracer. CT data was obtained and used for attenuation correction and anatomic localization. Fasting blood glucose: 83 mg/dl COMPARISON:  Chest abdomen pelvis CT 12/27/2023. FINDINGS: Mediastinal blood pool activity: SUV max 2.0 Liver activity: SUV max NA NECK: No hypermetabolic lymph nodes in the neck. Incidental CT findings: None. CHEST: 2.4 cm right thyroid nodule with only low level FDG uptake ( SUV max = 3.3). Smaller left thyroid nodule evident. Hypermetabolic mediastinal lymph nodes evident including 9 mm short axis precarinal lymph node on 60/4 ( SUV max = 11.3).r 10 mm short axis subcarinal lymph node demonstrates SUV max = 7.9. Right hilar lymph  nodes are hypermetabolic with SUV max = 12.4. Right lower lobe nodule of concern on previous CT is hypermetabolic with SUV max = 14.9. Left breast lesion is hypermetabolic with SUV max = 10.9. Incidental CT findings: No pericardial or pleural effusion. ABDOMEN/PELVIS: No abnormal hypermetabolic activity within the liver, pancreas, adrenal glands, or spleen. No hypermetabolic lymph nodes in the abdomen or pelvis. Incidental CT findings: There is mild atherosclerotic calcification of the abdominal aorta without aneurysm. SKELETON: Intensely hypermetabolic lesion in the left aspect of the L5 vertebral body demonstrates SUV max = 19.6. Hypermetabolic lesion posterior right iliac bone shows SUV max = 10.9. Subtle hypermetabolic lesion in the right acetabular roof with SUV max = 4.9. Incidental CT findings: None. IMPRESSION: 1. Hypermetabolic right lower lobe pulmonary nodule with hypermetabolic mediastinal and right hilar lymph nodes. Imaging features compatible with metastatic disease. 2. Hypermetabolic left breast lesion compatible with known breast cancer. 3. Hypermetabolic osseous lesions in the L5 vertebral body,  posterior right iliac bone, and right acetabular roof compatible with osseous metastatic disease. 4. 2.4 cm right thyroid nodule with only low level FDG uptake. Recommend thyroid US  (ref: J Am Coll Radiol. 2015 Feb;12(2): 143-50). 5.  Aortic Atherosclerosis (ICD10-I70.0). Electronically Signed: By: Donnal Fusi M.D. On: 01/03/2024 08:20   CT Chest W Contrast Result Date: 12/27/2023 EXAMINATION: CT ABDOMEN PELVIS W CONTRAST (accession [id number] [facility]), CT CHEST W CONTRAST (accession [id number] [facility]) CLINICAL INDICATION: Female, 59 years old. Breast cancer, invasive, stage I/II/III, initial workup; new recurrence breast cancer TECHNIQUE: Axial CT of the chest, abdomen and pelvis with 100 cc Omnipaque  300 intravenous contrast. Multiplanar reformations provided. Unless otherwise specified, incidental thyroid,  adrenal, renal lesions do not require dedicated imaging follow up. Additionally, any mentioned pulmonary nodules do not require dedicated imaging follow-up based on the Fleischner guidelines unless otherwise specified. Coronary calcifications are not identified unless otherwise specified. COMPARISON: FINDINGS: There is a mass noted in the left breast measuring 2.5 cm. There is a 2.4 cm right thyroid lobe nodule. The thoracic aorta is normal in caliber. The main pulmonary artery is normal in caliber. The heart is normal in size. There is no free fluid or pathologic lymphadenopathy by size criteria. The trachea and mainstem bronchi are patent. There is a right lower lobe pulmonary nodule measuring 17 mm (series 2 image 40). The liver appears normal other than a subcentimeter probable cyst. The gallbladder is normal. The spleen is normal. The pancreas is normal. Adrenals are normal. The kidneys appear normal. The abdominal aorta is normal in caliber. Scattered atherosclerotic changes are present. The bladder is normal. The uterus is normal. There is colonic diverticulosis. Large and small bowel loops are otherwise within normal limits. No free fluid or adenopathy. No concerning osseous lesions are identified. There are mild degenerative changes of the spine and bony pelvis. IMPRESSION: Left breast mass concerning for the patient's primary neoplasm. 17 mm right lower lobe pulmonary nodule worrisome for metastatic disease. PET/CT is suggested for further evaluation. 2.4 cm nodule in the right thyroid lobe. Dedicated thyroid ultrasound is suggested for further evaluation. DOSE REDUCTION: This exam was performed according to our departmental dose-optimization program which includes automated exposure control, adjustment of the mA and/or kV according to patient size and/or use of iterative reconstruction technique. Electronically signed by: [place] Engel MD 12/27/2023 12:13 PM EDT RP Workstation: [id number]   CT ABDOMEN  PELVIS W CONTRAST Result Date: 12/27/2023 EXAMINATION: CT ABDOMEN PELVIS W CONTRAST (accession [id number] [facility]), CT CHEST W CONTRAST (accession [id number] [facility]) CLINICAL INDICATION: Female, 58 years old. Breast cancer, invasive, stage I/II/III, initial workup; new recurrence breast cancer TECHNIQUE: Axial CT of the chest, abdomen and pelvis with 100 cc Omnipaque  300 intravenous contrast. Multiplanar reformations provided. Unless otherwise specified, incidental thyroid, adrenal, renal lesions do not require dedicated imaging follow up. Additionally, any mentioned pulmonary nodules do not require dedicated imaging follow-up based on the Fleischner guidelines unless otherwise specified. Coronary calcifications are not identified unless otherwise specified. COMPARISON: FINDINGS: There is a mass noted in the left breast measuring 2.5 cm. There is a 2.4 cm right thyroid lobe nodule. The thoracic aorta is normal in caliber. The main pulmonary artery is normal in caliber. The heart is normal in size. There is no free fluid or pathologic lymphadenopathy by size criteria. The trachea and mainstem bronchi are patent. There is a right lower lobe pulmonary nodule measuring 17 mm (series 2 image 40). The liver appears normal other than a subcentimeter probable cyst. The gallbladder is normal. The spleen  is normal. The pancreas is normal. Adrenals are normal. The kidneys appear normal. The abdominal aorta is normal in caliber. Scattered atherosclerotic changes are present. The bladder is normal. The uterus is normal. There is colonic diverticulosis. Large and small bowel loops are otherwise within normal limits. No free fluid or adenopathy. No concerning osseous lesions are identified. There are mild degenerative changes of the spine and bony pelvis. IMPRESSION: Left breast mass concerning for the patient's primary neoplasm. 17 mm right lower lobe pulmonary nodule worrisome for metastatic disease. PET/CT is suggested for further  evaluation. 2.4 cm nodule in the right thyroid lobe. Dedicated thyroid ultrasound is suggested for further evaluation. DOSE REDUCTION: This exam was performed according to our departmental dose-optimization program which includes automated exposure control, adjustment of the mA and/or kV according to patient size and/or use of iterative reconstruction technique. Electronically signed by: [place] Engel MD 12/27/2023 12:13 PM EDT RP Workstation: [id number]   NM Bone Scan Whole Body Result Date: 12/26/2023 CLINICAL DATA:  Breast cancer.  Evaluate for skeletal metastasis EXAM: NUCLEAR MEDICINE WHOLE BODY BONE SCAN TECHNIQUE: Whole body anterior and posterior images were obtained approximately 3 hours after intravenous injection of radiopharmaceutical. RADIOPHARMACEUTICALS:  20.6 mCi Technetium-16m MDP IV COMPARISON:  None FINDINGS: On posterior planar imaging there is focal uptake in the LEFT cervical spine which is atypical location for degenerative disc disease. No focal uptake within the axillary or appendicular skeleton to localize breast carcinoma. IMPRESSION: No scintigraphic evidence of skeletal metastasis. Electronically Signed   By: Deboraha Fallow M.D.   On: 12/26/2023 15:14     Past medical hx Past Medical History:  Diagnosis Date   Achilles tendonitis    Anxiety    Breast cancer (HCC) 04/20/2011   Left, ER/PR+   Chest pain    Depression    Dyspareunia in female    Dysthymic disorder    GAD (generalized anxiety disorder)    Heel spur, left    History of radiation therapy    09/18/11 to 11/03/11, left breast   Hypercholesterolemia    Hypotension    Insomnia    Migraines    Osteopenia    Postmenopausal atrophic vaginitis    Ulcerative colitis (HCC)      Social History   Tobacco Use   Smoking status: Never    Passive exposure: Never   Smokeless tobacco: Never  Substance Use Topics   Alcohol use: Yes    Alcohol/week: 2.0 - 3.0 standard drinks of alcohol    Types: 2 - 3 Glasses  of wine per week    Comment: occassionally   Drug use: No    Ms.Nosbisch reports that she has never smoked. She has never been exposed to tobacco smoke. She has never used smokeless tobacco. She reports current alcohol use of about 2.0 - 3.0 standard drinks of alcohol per week. She reports that she does not use drugs.  Tobacco Cessation: Never smoker    Past surgical hx, Family hx, Social hx all reviewed.  Current Outpatient Medications on File Prior to Visit  Medication Sig   b complex vitamins tablet Take 1 tablet by mouth daily.    Cholecalciferol (VITAMIN D3) 25 MCG (1000 UT) CAPS 1 capsule.   clobetasol cream (TEMOVATE) 0.05 % Apply 1 Application topically.   letrozole  (FEMARA ) 2.5 MG tablet Take 1 tablet (2.5 mg total) by mouth daily.   mesalamine (LIALDA) 1.2 G EC tablet Take 1,200 mg by mouth daily with breakfast.   Multiple Vitamin (MULITIVITAMIN WITH  MINERALS) TABS Take 1 tablet by mouth daily.   nitroGLYCERIN  (NITROSTAT ) 0.4 MG SL tablet Place 1 tablet (0.4 mg total) under the tongue every 5 (five) minutes as needed for chest pain.   SUMAtriptan (IMITREX) 50 MG tablet Take 50 mg by mouth every 2 (two) hours as needed for migraine. May repeat in 2 hours if headache persists or recurs.   venlafaxine  XR (EFFEXOR -XR) 37.5 MG 24 hr capsule TAKE 2 CAPSULES (75 MG TOTAL) BY MOUTH DAILY.   No current facility-administered medications on file prior to visit.     No Known Allergies  Review Of Systems:  Constitutional:   No  weight loss, night sweats,  Fevers, chills, fatigue, or  lassitude.  HEENT:   No headaches,  Difficulty swallowing,  Tooth/dental problems, or  Sore throat,                No sneezing, itching, ear ache, nasal congestion, post nasal drip,   CV:  No chest pain,  Orthopnea, PND, swelling in lower extremities, anasarca, dizziness, palpitations, syncope.   GI  No heartburn, indigestion, abdominal pain, nausea, vomiting, diarrhea, change in bowel habits, loss of  appetite, bloody stools.   Resp: No shortness of breath with exertion or at rest.  No excess mucus, no productive cough,  No non-productive cough,  No coughing up of blood.  No change in color of mucus.  No wheezing.  No chest wall deformity  Skin: no rash or lesions.  GU: no dysuria, change in color of urine, no urgency or frequency.  No flank pain, no hematuria   MS:  No joint pain or swelling.  No decreased range of motion.  No back pain.  Psych:  No change in mood or affect. No depression or anxiety.  No memory loss.   Vital Signs BP 108/77 (BP Location: Right Arm, Patient Position: Sitting, Cuff Size: Normal)   Pulse 86   Ht 5' 4 (1.626 m)   Wt 129 lb (58.5 kg)   SpO2 99%   BMI 22.14 kg/m    Physical Exam:  General- No distress,  A&Ox3, pleasant ENT: No sinus tenderness, TM clear, pale nasal mucosa, no oral exudate,no post nasal drip, no LAN Cardiac: S1, S2, regular rate and rhythm, no murmur Chest: No wheeze/ rales/ dullness; no accessory muscle use, no nasal flaring, no sternal retractions Abd.: Soft Non-tender, ND, BS +, Body mass index is 22.14 kg/m.  Ext: No clubbing cyanosis, edema, no obvious deformities Neuro:  normal strength, MAE x 4, A&O  x 3 Skin: No rashes, warm and dry, no obvious skin lesions  Psych: normal mood and behavior   Assessment/Plan Hypermetabolic lung nodule and lymph nodes in setting of recurrent breast cancer. Never smoker  Plan I have placed an order for a bronchoscopy with biopsies of the right lung nodule and lymph nodes.  We have discussed the procedure in detail.  We have reviewed the risks and benefits of the procedure. These include bleeding, infection, puncture of the lung, and adverse reaction to anesthesia. You have agreed to proceed with biopsy to evaluate the nodule of concern Your procedure will be done by Dr. Racheal Buddle. You will receive a letter today with date time and information pertaining to the procedure. You will  need someone to drive you to the procedure, stay with you during the procedure, and stay with you after the procedure. You will also need someone to stay with you for 24 hours after anesthesia to ensure you have  cleared and are doing well. You will follow-up with me 1 week after the procedure to review the results and to ensure you are doing well. Call if you need us  prior to the procedure or if you have any questions at all. Please contact office for sooner follow up if symptoms do not improve or worsen or seek emergency care      I spent 30 minutes dedicated to the care of this patient on the date of this encounter to include pre-visit review of records, face-to-face time with the patient discussing conditions above, post visit ordering of testing, clinical documentation with the electronic health record, making appropriate referrals as documented, and communicating necessary information to the patient's healthcare team.    Raejean Bullock, NP 01/08/2024  11:13 AM   The following has been scheduled for you:   Procedure: Bronchoscopy                 Surgeon:  Dr Baldwin Levee    Procedure date:  01/15/24 at 9:15 am  Arrival Time:  6:45 am Location: [facility] [address] Entrance [address] [place], [place] [address]   Additional Information: Do not eat anything after midnight - ok to take medications on the day of procedure with only sips of water You will need someone to drive you home after this procedure & to be with you for the next 24 hours You may receive a call from the hospital a day or two prior to procedure to go over additional instructions & medications  NEXT VISIT:  01/21/24 at 1:30 pm With Dara Ear, NP at    [facility]    [address], [address] [place], [place]  [address]

## 2024-01-10 ENCOUNTER — Ambulatory Visit: Payer: Self-pay | Admitting: Genetic Counselor

## 2024-01-10 ENCOUNTER — Encounter: Payer: Self-pay | Admitting: Genetic Counselor

## 2024-01-10 DIAGNOSIS — Z1379 Encounter for other screening for genetic and chromosomal anomalies: Secondary | ICD-10-CM

## 2024-01-10 NOTE — Progress Notes (Signed)
 HPI:   Ms. Bertling was previously seen in the [facility] due to a personal and family history of cancer and concerns regarding a hereditary predisposition to cancer. Please refer to our prior cancer genetics clinic note for more information regarding our discussion, assessment and recommendations, at the time. Ms. Maxcy recent genetic test results were disclosed to her, as were recommendations warranted by these results. These results and recommendations are discussed in more detail below.  CANCER HISTORY:  Oncology History  Malignant neoplasm of upper-outer quadrant of left breast in female, estrogen receptor positive (HCC)  04/26/2011 Initial Diagnosis   Malignant neoplasm of upper-outer quadrant of left breast in female, estrogen receptor positive (HCC)   11/30/2023 Mammogram   Focal asymmetry with calcifications in the left breast is indeterminate. Additional views with US  is recommended. US  in the breast   12/11/2023 Pathology Results   Left breast needle core biopsy showed invasive poorly differentiated adenocarcinoma grade 3 prognostic showed ER 100% positive strong staining PR 60% positive strong staining Ki-67 of 30% and group 5 HER2 negative    Genetic Testing   [facility] CancerNext-Expanded Panel+RNA was Negative. Report date is 01/06/2024.   The CancerNext-Expanded gene panel offered by [facility] and includes sequencing, rearrangement, and RNA analysis for the following 77 genes: AIP, ALK, APC, ATM, AXIN2, BAP1, BARD1, BMPR1A, BRCA1, BRCA2, BRIP1, CDC73, CDH1, CDK4, CDKN1B, CDKN2A, CEBPA, CHEK2, CTNNA1, DDX41, DICER1, ETV6, FH, FLCN, GATA2, LZTR1, MAX, MBD4, MEN1, MET, MLH1, MSH2, MSH3, MSH6, MUTYH, NF1, NF2, NTHL1, PALB2, PHOX2B, PMS2, POT1, PRKAR1A, PTCH1, PTEN, RAD51C, RAD51D, RB1, RET, RPS20, RUNX1, SDHA, SDHAF2, SDHB, SDHC, SDHD, SMAD4, SMARCA4, SMARCB1, SMARCE1, STK11, SUFU, TMEM127, TP53, TSC1, TSC2, VHL, and WT1 (sequencing and deletion/duplication); EGFR,  HOXB13, KIT, MITF, PDGFRA, POLD1, and POLE (sequencing only); EPCAM and GREM1 (deletion/duplication only).      FAMILY HISTORY:  We obtained a detailed, 4-generation family history.  Significant diagnoses are listed below:      Family History  Problem Relation Age of Onset   Bladder Cancer Father 47   Diabetes Father     Prostate cancer Paternal Grandfather             Ms. Liz is unaware of previous family history of genetic testing for hereditary cancer risks. There is no reported Ashkenazi Jewish ancestry.   GENETIC TEST RESULTS:  The [facility] CancerNext-Expanded Panel found no pathogenic mutations.   The CancerNext-Expanded gene panel offered by [facility] and includes sequencing, rearrangement, and RNA analysis for the following 77 genes: AIP, ALK, APC, ATM, AXIN2, BAP1, BARD1, BMPR1A, BRCA1, BRCA2, BRIP1, CDC73, CDH1, CDK4, CDKN1B, CDKN2A, CEBPA, CHEK2, CTNNA1, DDX41, DICER1, ETV6, FH, FLCN, GATA2, LZTR1, MAX, MBD4, MEN1, MET, MLH1, MSH2, MSH3, MSH6, MUTYH, NF1, NF2, NTHL1, PALB2, PHOX2B, PMS2, POT1, PRKAR1A, PTCH1, PTEN, RAD51C, RAD51D, RB1, RET, RPS20, RUNX1, SDHA, SDHAF2, SDHB, SDHC, SDHD, SMAD4, SMARCA4, SMARCB1, SMARCE1, STK11, SUFU, TMEM127, TP53, TSC1, TSC2, VHL, and WT1 (sequencing and deletion/duplication); EGFR, HOXB13, KIT, MITF, PDGFRA, POLD1, and POLE (sequencing only); EPCAM and GREM1 (deletion/duplication only).   The test report has been scanned into EPIC and is located under the Molecular Pathology section of the Results Review tab.  A portion of the result report is included below for reference. Genetic testing reported out on 01/06/2024.       Even though a pathogenic variant was not identified, possible explanations for the cancer in the family may include: There may be no hereditary risk for cancer in the family. The cancers in  Ms. Catena and/or her family may be due to other genetic or environmental factors. There may be a gene mutation in one of these  genes that current testing methods cannot detect, but that chance is small. There could be another gene that has not yet been discovered, or that we have not yet tested, that is responsible for the cancer diagnoses in the family.   Therefore, it is important to remain in touch with cancer genetics in the future so that we can continue to offer Ms. Whitner the most up to date genetic testing.   ADDITIONAL GENETIC TESTING:  We discussed with Ms. Cannedy that her genetic testing was fairly extensive.  If there are genes identified to increase cancer risk that can be analyzed in the future, we would be happy to discuss and coordinate this testing at that time.     CANCER SCREENING RECOMMENDATIONS:  Ms. Harnden test result is considered negative (normal).  This means that we have not identified a hereditary cause for her personal and family history of cancer at this time.   An individual's cancer risk and medical management are not determined by genetic test results alone. Overall cancer risk assessment incorporates additional factors, including personal medical history, family history, and any available genetic information that may result in a personalized plan for cancer prevention and surveillance. Therefore, it is recommended she continue to follow the cancer management and screening guidelines provided by her oncology and primary healthcare provider.  RECOMMENDATIONS FOR FAMILY MEMBERS:   Since she did not inherit a mutation in a cancer predisposition gene included on this panel, her children could not have inherited a mutation from her in one of these genes. Individuals in this family might be at some increased risk of developing cancer, over the general population risk, due to the family history of cancer. We recommend women in this family have a yearly mammogram beginning at age 40, or 43 years younger than the earliest onset of cancer, an annual clinical breast exam, and perform monthly breast  self-exams.  FOLLOW-UP:  Cancer genetics is a rapidly advancing field and it is possible that new genetic tests will be appropriate for her and/or her family members in the future. We encouraged her to remain in contact with cancer genetics on an annual basis so we can update her personal and family histories and let her know of advances in cancer genetics that may benefit this family.   Our contact number was provided. Ms. Mancias questions were answered to her satisfaction, and she knows she is welcome to call us  at anytime with additional questions or concerns.   Saniya Tranchina, MS, [facility] Genetic Counselor [email or web address] (P) [phone]

## 2024-01-14 ENCOUNTER — Encounter (HOSPITAL_COMMUNITY): Payer: Self-pay | Admitting: Emergency Medicine

## 2024-01-14 ENCOUNTER — Encounter (HOSPITAL_COMMUNITY)

## 2024-01-14 ENCOUNTER — Other Ambulatory Visit: Payer: Self-pay

## 2024-01-14 NOTE — Progress Notes (Addendum)
 PCP - Anthony Hint, FNP Cardiologist - Dr Powell Sorrow Oncology - Dr Amber Iruku  CT Chest x-ray - 12/27/23 EKG - n/a Stress Test - n/a ECHO - 06/06/22 Cardiac Cath - n/a  ICD Pacemaker/Loop - n/a  Sleep Study -  n/a CPAP - none  Diabetes - n/a  Aspirin & Blood Thinner Instructions:  n/a  NPO   Anesthesia review: no  STOP now taking any Aspirin (unless otherwise instructed by your surgeon), Aleve, Naproxen, Ibuprofen, Motrin, Advil, Goody's, BC's, all herbal medications, fish oil, and all vitamins.   Coronavirus Screening Do you have any of the following symptoms:  Cough yes/no: No Fever (>100.42F)  yes/no: No Runny nose yes/no: No Sore throat yes/no: No Difficulty breathing/shortness of breath  yes/no: No  Have you traveled in the last 14 days and where? yes/no: No  Patient verbalized understanding of instructions that were given via phone.

## 2024-01-15 ENCOUNTER — Ambulatory Visit (HOSPITAL_COMMUNITY)

## 2024-01-15 ENCOUNTER — Other Ambulatory Visit: Payer: Self-pay

## 2024-01-15 ENCOUNTER — Encounter (HOSPITAL_BASED_OUTPATIENT_CLINIC_OR_DEPARTMENT_OTHER): Payer: Self-pay

## 2024-01-15 ENCOUNTER — Ambulatory Visit (HOSPITAL_COMMUNITY)
Admission: RE | Admit: 2024-01-15 | Discharge: 2024-01-15 | Disposition: A | Discharge status: Home / Self Care | Attending: Emergency Medicine | Admitting: Emergency Medicine

## 2024-01-15 ENCOUNTER — Ambulatory Visit (HOSPITAL_BASED_OUTPATIENT_CLINIC_OR_DEPARTMENT_OTHER): Admitting: Anesthesiology

## 2024-01-15 ENCOUNTER — Encounter (HOSPITAL_COMMUNITY)
Admission: RE | Disposition: A | Discharge status: Home / Self Care | Payer: Self-pay | Source: Home / Self Care | Attending: Emergency Medicine

## 2024-01-15 ENCOUNTER — Ambulatory Visit (HOSPITAL_COMMUNITY): Admitting: Anesthesiology

## 2024-01-15 ENCOUNTER — Encounter (HOSPITAL_COMMUNITY): Payer: Self-pay | Admitting: Emergency Medicine

## 2024-01-15 ENCOUNTER — Ambulatory Visit (HOSPITAL_BASED_OUTPATIENT_CLINIC_OR_DEPARTMENT_OTHER): Admit: 2024-01-15 | Admitting: General Surgery

## 2024-01-15 ENCOUNTER — Encounter: Payer: Self-pay | Admitting: Hematology and Oncology

## 2024-01-15 DIAGNOSIS — R911 Solitary pulmonary nodule: Secondary | ICD-10-CM

## 2024-01-15 DIAGNOSIS — R590 Localized enlarged lymph nodes: Secondary | ICD-10-CM | POA: Diagnosis not present

## 2024-01-15 DIAGNOSIS — I700 Atherosclerosis of aorta: Secondary | ICD-10-CM | POA: Insufficient documentation

## 2024-01-15 DIAGNOSIS — Z1721 Progesterone receptor positive status: Secondary | ICD-10-CM | POA: Insufficient documentation

## 2024-01-15 DIAGNOSIS — C3431 Malignant neoplasm of lower lobe, right bronchus or lung: Secondary | ICD-10-CM | POA: Diagnosis not present

## 2024-01-15 DIAGNOSIS — Z923 Personal history of irradiation: Secondary | ICD-10-CM | POA: Diagnosis not present

## 2024-01-15 DIAGNOSIS — Z853 Personal history of malignant neoplasm of breast: Secondary | ICD-10-CM | POA: Diagnosis not present

## 2024-01-15 DIAGNOSIS — Z1732 Human epidermal growth factor receptor 2 negative status: Secondary | ICD-10-CM | POA: Diagnosis not present

## 2024-01-15 DIAGNOSIS — Z170 Estrogen receptor positive status [ER+]: Secondary | ICD-10-CM | POA: Diagnosis not present

## 2024-01-15 DIAGNOSIS — C771 Secondary and unspecified malignant neoplasm of intrathoracic lymph nodes: Secondary | ICD-10-CM | POA: Insufficient documentation

## 2024-01-15 HISTORY — PX: ENDOBRONCHIAL ULTRASOUND: SHX5096

## 2024-01-15 HISTORY — PX: VIDEO BRONCHOSCOPY WITH ENDOBRONCHIAL NAVIGATION: SHX6175

## 2024-01-15 LAB — BASIC METABOLIC PANEL WITH GFR
Anion gap: 9 (ref 5–15)
BUN: 11 mg/dL (ref 6–20)
CO2: 26 mmol/L (ref 22–32)
Calcium: 9.1 mg/dL (ref 8.9–10.3)
Chloride: 106 mmol/L (ref 98–111)
Creatinine, Ser: 0.75 mg/dL (ref 0.44–1.00)
GFR, Estimated: >60 mL/min (ref >60)
Glucose, Bld: 97 mg/dL (ref 70–99)
Potassium: 4.6 mmol/L (ref 3.5–5.1)
Sodium: 141 mmol/L (ref 135–145)

## 2024-01-15 LAB — CBC
HCT: 42.7 % (ref 36.0–46.0)
Hemoglobin: 13.6 g/dL (ref 12.0–15.0)
MCH: 28.1 pg (ref 26.0–34.0)
MCHC: 31.9 g/dL (ref 30.0–36.0)
MCV: 88.2 fL (ref 80.0–100.0)
Platelets: UNDETERMINED 10*3/uL (ref 150–400)
RBC: 4.84 MIL/uL (ref 3.87–5.11)
RDW: 13.8 % (ref 11.5–15.5)
WBC: 3.9 10*3/uL — ABNORMAL LOW (ref 4.0–10.5)
nRBC: 0 % (ref 0.0–0.2)

## 2024-01-15 SURGERY — MASTECTOMY WITH SENTINEL LYMPH NODE BIOPSY
Anesthesia: General | Laterality: Left

## 2024-01-15 SURGERY — VIDEO BRONCHOSCOPY WITH ENDOBRONCHIAL NAVIGATION
Anesthesia: General | Laterality: Right

## 2024-01-15 MED ORDER — PROPOFOL 10 MG/ML IV BOLUS
INTRAVENOUS | Status: DC | PRN
  Administered 2024-01-15: 120 mg via INTRAVENOUS

## 2024-01-15 MED ORDER — PHENYLEPHRINE 80 MCG/ML (10ML) SYRINGE FOR IV PUSH (FOR BLOOD PRESSURE SUPPORT)
PREFILLED_SYRINGE | INTRAVENOUS | Status: DC | PRN
Start: 2024-01-15 — End: 2024-01-15
  Administered 2024-01-15 (×3): 160 ug via INTRAVENOUS
  Administered 2024-01-15: 240 ug via INTRAVENOUS

## 2024-01-15 MED ORDER — OXYCODONE HCL 5 MG/5ML PO SOLN: 5.0000 mg | Freq: Once | ORAL | Status: DC | PRN

## 2024-01-15 MED ORDER — FENTANYL CITRATE (PF) 100 MCG/2ML IJ SOLN
INTRAMUSCULAR | Status: AC
  Filled 2024-01-15: qty 2

## 2024-01-15 MED ORDER — DEXMEDETOMIDINE HCL IN NACL 80 MCG/20ML IV SOLN
INTRAVENOUS | Status: DC | PRN
  Administered 2024-01-15: 8 ug via INTRAVENOUS

## 2024-01-15 MED ORDER — LACTATED RINGERS IV SOLN: INTRAVENOUS | Status: DC

## 2024-01-15 MED ORDER — GLYCOPYRROLATE 0.2 MG/ML IJ SOLN
INTRAMUSCULAR | Status: DC | PRN
  Administered 2024-01-15: .2 mg via INTRAVENOUS

## 2024-01-15 MED ORDER — ONDANSETRON HCL 4 MG/2ML IJ SOLN
INTRAMUSCULAR | Status: DC | PRN
  Administered 2024-01-15: 4 mg via INTRAVENOUS

## 2024-01-15 MED ORDER — ACETAMINOPHEN 10 MG/ML IV SOLN: 1000.0000 mg | Freq: Once | INTRAVENOUS | Status: DC | PRN

## 2024-01-15 MED ORDER — LIDOCAINE 2% (20 MG/ML) 5 ML SYRINGE
INTRAMUSCULAR | Status: DC | PRN
Start: 2024-01-15 — End: 2024-01-15
  Administered 2024-01-15: 100 mg via INTRAVENOUS

## 2024-01-15 MED ORDER — OXYCODONE HCL 5 MG PO TABS: 5.0000 mg | ORAL_TABLET | Freq: Once | ORAL | Status: DC | PRN

## 2024-01-15 MED ORDER — ONDANSETRON HCL 4 MG/2ML IJ SOLN: 4.0000 mg | Freq: Once | INTRAMUSCULAR | Status: DC | PRN

## 2024-01-15 MED ORDER — FENTANYL CITRATE (PF) 100 MCG/2ML IJ SOLN: 25.0000 ug | INTRAMUSCULAR | Status: DC | PRN

## 2024-01-15 MED ORDER — CHLORHEXIDINE GLUCONATE 0.12 % MT SOLN
15.0000 mL | Freq: Once | OROMUCOSAL | Status: AC
  Administered 2024-01-15: 15 mL via OROMUCOSAL
  Filled 2024-01-15: qty 15

## 2024-01-15 MED ORDER — DEXAMETHASONE SODIUM PHOSPHATE 10 MG/ML IJ SOLN
INTRAMUSCULAR | Status: DC | PRN
  Administered 2024-01-15: 20 mg via INTRAVENOUS

## 2024-01-15 MED ORDER — EPHEDRINE SULFATE (PRESSORS) 50 MG/ML IJ SOLN
INTRAMUSCULAR | Status: DC | PRN
  Administered 2024-01-15: 5 mg via INTRAVENOUS
  Administered 2024-01-15: 10 mg via INTRAVENOUS

## 2024-01-15 MED ORDER — FENTANYL CITRATE (PF) 250 MCG/5ML IJ SOLN
INTRAMUSCULAR | Status: DC | PRN
  Administered 2024-01-15 (×2): 50 ug via INTRAVENOUS

## 2024-01-15 MED ORDER — ROCURONIUM BROMIDE 10 MG/ML (PF) SYRINGE
PREFILLED_SYRINGE | INTRAVENOUS | Status: DC | PRN
Start: 2024-01-15 — End: 2024-01-15
  Administered 2024-01-15: 40 mg via INTRAVENOUS

## 2024-01-15 MED ORDER — SUGAMMADEX SODIUM 200 MG/2ML IV SOLN
INTRAVENOUS | Status: DC | PRN
  Administered 2024-01-15: 200 mg via INTRAVENOUS

## 2024-01-15 SURGICAL SUPPLY — 36 items

## 2024-01-15 NOTE — Anesthesia Preprocedure Evaluation (Addendum)
 Anesthesia Evaluation  Patient identified by MRN, date of birth, ID band Patient awake    Reviewed: Allergy & Precautions, NPO status , Patient's Chart, lab work & pertinent test results, reviewed documented beta blocker date and time   History of Anesthesia Complications Negative for: history of anesthetic complications  Airway Mallampati: III  TM Distance: >3 FB   Mouth opening: Limited Mouth Opening  Dental   Pulmonary neg COPD   breath sounds clear to auscultation       Cardiovascular Exercise Tolerance: Good (-) Past MI, (-) Cardiac Stents and (-) CABG negative cardio ROS  Rhythm:Regular Rate:Normal  IMPRESSIONS     1. Left ventricular ejection fraction, by estimation, is 60 to 65%. The  left ventricle has normal function. The left ventricle has no regional  wall motion abnormalities. Left ventricular diastolic parameters are  consistent with Grade I diastolic  dysfunction (impaired relaxation). The average left ventricular global  longitudinal strain is -23.5 %. The global longitudinal strain is normal.   2. Right ventricular systolic function is normal. The right ventricular  size is normal. Tricuspid regurgitation signal is inadequate for assessing  PA pressure.   3. No evidence of mitral valve regurgitation.   4. The aortic valve is tricuspid. Aortic valve regurgitation is not  visualized.   5. The inferior vena cava is normal in size with greater than 50%  respiratory variability, suggesting right atrial pressure of 3 mmHg.     Neuro/Psych  Headaches, neg Seizures PSYCHIATRIC DISORDERS Anxiety Depression       GI/Hepatic PUD,,,(+) neg Cirrhosis        Endo/Other    Renal/GU Renal disease     Musculoskeletal   Abdominal   Peds  Hematology   Anesthesia Other Findings   Reproductive/Obstetrics                             Anesthesia Physical Anesthesia Plan  ASA:  2  Anesthesia Plan: General   Post-op Pain Management:    Induction: Intravenous  PONV Risk Score and Plan: 2 and Ondansetron  and Dexamethasone   Airway Management Planned: Oral ETT and Video Laryngoscope Planned  Additional Equipment:   Intra-op Plan:   Post-operative Plan: Extubation in OR  Informed Consent: I have reviewed the patients History and Physical, chart, labs and discussed the procedure including the risks, benefits and alternatives for the proposed anesthesia with the patient or authorized representative who has indicated his/her understanding and acceptance.     Dental advisory given  Plan Discussed with: CRNA  Anesthesia Plan Comments:         Anesthesia Quick Evaluation

## 2024-01-15 NOTE — Interval H&P Note (Signed)
 History and Physical Interval Note:  01/15/2024 9:02 AM  Erin Castillo  has presented today for surgery, with the diagnosis of lung nodule.  The various methods of treatment have been discussed with the patient and family. After consideration of risks, benefits and other options for treatment, the patient has consented to  Procedure(s): VIDEO BRONCHOSCOPY WITH ENDOBRONCHIAL NAVIGATION (Right) ENDOBRONCHIAL ULTRASOUND (EBUS) as a surgical intervention.  The patient's history has been reviewed, patient examined, no change in status, stable for surgery.  I have reviewed the patient's chart and labs.  Questions were answered to the patient's satisfaction.     Lamar GORMAN [name]

## 2024-01-15 NOTE — Transfer of Care (Signed)
 Immediate Anesthesia Transfer of Care Note  Patient: Erin Castillo  Procedure(s) Performed: VIDEO BRONCHOSCOPY WITH ENDOBRONCHIAL NAVIGATION (Right) ENDOBRONCHIAL ULTRASOUND (EBUS)  Patient Location: PACU  Anesthesia Type:General  Level of Consciousness: awake, alert , and oriented  Airway & Oxygen Therapy: Patient Spontanous Breathing and Patient connected to nasal cannula oxygen  Post-op Assessment: Report given to RN and Post -op Vital signs reviewed and stable  Post vital signs: Reviewed and stable  Last Vitals:  Vitals Value Taken Time  BP 129/83 01/15/24 10:33  Temp    Pulse 109 01/15/24 10:35  Resp 13 01/15/24 10:35  SpO2 100 % 01/15/24 10:35  Vitals shown include unfiled device data.  Last Pain:  Vitals:   01/15/24 0729  TempSrc:   PainSc: 0-No pain         Complications: No notable events documented.

## 2024-01-15 NOTE — Discharge Instructions (Signed)

## 2024-01-15 NOTE — Anesthesia Postprocedure Evaluation (Signed)
 Anesthesia Post Note  Patient: Erin Castillo  Procedure(s) Performed: VIDEO BRONCHOSCOPY WITH ENDOBRONCHIAL NAVIGATION (Right) ENDOBRONCHIAL ULTRASOUND (EBUS)     Patient location during evaluation: PACU Anesthesia Type: General Level of consciousness: awake and alert Pain management: pain level controlled Vital Signs Assessment: post-procedure vital signs reviewed and stable Respiratory status: spontaneous breathing, nonlabored ventilation, respiratory function stable and patient connected to nasal cannula oxygen Cardiovascular status: blood pressure returned to baseline and stable Postop Assessment: no apparent nausea or vomiting Anesthetic complications: no   No notable events documented.  Last Vitals:  Vitals:   01/15/24 1045 01/15/24 1100  BP: 111/73 106/61  Pulse: 88 99  Resp: 20 19  Temp:  (!) 36.4 C  SpO2: 100% 95%    Last Pain:  Vitals:   01/15/24 1100  TempSrc:   PainSc: 0-No pain                 Lynwood MARLA Cornea

## 2024-01-15 NOTE — Op Note (Signed)
 Video Bronchoscopy with Endobronchial Ultrasound and Electromagnetic Navigation Procedure Note  Date of Operation: 01/15/2024  Pre-op Diagnosis: Right lower lobe nodule, mediastinal adenopathy  Post-op Diagnosis: Same  Surgeon: LAMAR [name]  Assistants: None  Anesthesia: General endotracheal anesthesia  Operation: Flexible video fiberoptic bronchoscopy with endobronchial ultrasound, robotic assisted navigation and biopsies.  Estimated Blood Loss: Minimal  Complications: None apparent  Indications and History: Erin Castillo is a 58 y.o. female with history of breast cancer.  She was found to have a right lower lobe pulmonary nodule mediastinal adenopathy on surveillance imaging.  These were hypermetabolic on subsequent PET scan.  Recommendation was made to achieve a tissue diagnosis using endobronchial ultrasound and robotic assisted navigational bronchoscopy. The risks, benefits, complications, treatment options and expected outcomes were discussed with the patient.  The possibilities of pneumothorax, pneumonia, reaction to medication, pulmonary aspiration, perforation of a viscus, bleeding, failure to diagnose a condition and creating a complication requiring transfusion or operation were discussed with the patient who freely signed the consent.    Description of Procedure: The patient was seen in the Preoperative Area, was examined and was deemed appropriate to proceed.  The patient was taken to [facility] Endoscopy room 3, identified as Erin Castillo and the procedure verified as Flexible Video Fiberoptic Bronchoscopy with robotic assisted navigation and endobronchial ultrasound.  A Time Out was held and the above information confirmed.   Robotic assisted navigation: Prior to the date of the procedure a high-resolution CT scan of the chest was performed. Utilizing ION software program a virtual tracheobronchial tree was generated to allow the creation of distinct navigation pathways to the  patient's parenchymal abnormalities. After being taken to the operating room general anesthesia was initiated and the patient  was orally intubated. The video fiberoptic bronchoscope was introduced via the endotracheal tube and a general inspection was performed which showed normal right and left lung anatomy. Aspiration of the bilateral mainstems was completed to remove any remaining secretions. Robotic catheter inserted into patient's endotracheal tube.   Target #1 right lower lobe pulmonary nodule: The distinct navigation pathways prepared prior to this procedure were then utilized to navigate to patient's lesion identified on CT scan. The robotic catheter was secured into place and the vision probe was withdrawn.  Lesion location was approximated using fluoroscopy.  Local registration and targeting was performed using [employer] Cios mobile C-arm three-dimensional imaging. Under fluoroscopic guidance transbronchial needle brushings, transbronchial needle biopsies, and transbronchial forceps biopsies were performed to be sent for cytology and pathology.  Needle-in-lesion was confirmed using Cios mobile C-arm.    Endobronchial ultrasound: The robotic scope was then withdrawn and the endobronchial ultrasound was used to identify and characterize the peritracheal, hilar and bronchial lymph nodes. Inspection showed enlargement at station 4R and station 7. Using real-time ultrasound guidance Wang needle biopsies were take from Station 4R and 7 nodes and were sent for cytology.   At the end of the procedure a general airway inspection was performed and there was no evidence of active bleeding. The bronchoscope was removed.  The patient tolerated the procedure well. There was no significant blood loss and there were no obvious complications. A post-procedural chest x-ray is pending.  Samples Target #1: 1. Transbronchial Wang needle biopsies from right lower lobe nodule 2. Transbronchial forceps  biopsies from right lower lobe nodule   EBUS Samples: 1. Wang needle biopsies from 4R node 2. Wang needle biopsies from 7 node    LAMAR [name], MD, PhD 01/15/2024, 10:20 AM [facility]

## 2024-01-15 NOTE — Anesthesia Procedure Notes (Signed)
 Procedure Name: Intubation Date/Time: 01/15/2024 9:28 AM  Performed by: Elby Raelene SAUNDERS, CRNAPre-anesthesia Checklist: Patient identified, Emergency Drugs available, Suction available and Patient being monitored Patient Re-evaluated:Patient Re-evaluated prior to induction Oxygen Delivery Method: Circle System Utilized Preoxygenation: Pre-oxygenation with 100% oxygen Induction Type: IV induction Ventilation: Mask ventilation without difficulty Laryngoscope Size: Glidescope and 4 Grade View: Grade III Tube type: Oral Tube size: 8.5 mm Number of attempts: 1 Airway Equipment and Method: Stylet and Oral airway Placement Confirmation: ETT inserted through vocal cords under direct vision, positive ETCO2 and breath sounds checked- equal and bilateral Secured at: 21 cm Tube secured with: Tape Dental Injury: Teeth and Oropharynx as per pre-operative assessment

## 2024-01-15 NOTE — Op Note (Signed)
 Procedure Note  Patient: Erin Castillo  [employer] Cios mobile C-arm was utilized to identify and biopsy a right lower lobe pulmonary nodule.  Needle-in-lesion was confirmed using real-time Cios imaging, and images were uploaded to PACS.   Lamar [name], MD, PhD 01/15/2024, 10:26 AM [facility] [phone] or if no answer before 7:00PM call [phone] For any issues after 7:00PM please call eLink [phone]

## 2024-01-17 ENCOUNTER — Ambulatory Visit (HOSPITAL_COMMUNITY)
Admission: RE | Admit: 2024-01-17 | Discharge: 2024-01-17 | Disposition: A | Discharge status: Home / Self Care | Source: Ambulatory Visit | Attending: Hematology and Oncology | Admitting: Hematology and Oncology

## 2024-01-17 ENCOUNTER — Encounter (HOSPITAL_COMMUNITY): Payer: Self-pay | Admitting: Emergency Medicine

## 2024-01-17 DIAGNOSIS — Z170 Estrogen receptor positive status [ER+]: Secondary | ICD-10-CM | POA: Insufficient documentation

## 2024-01-17 DIAGNOSIS — C50412 Malignant neoplasm of upper-outer quadrant of left female breast: Secondary | ICD-10-CM | POA: Insufficient documentation

## 2024-01-18 ENCOUNTER — Other Ambulatory Visit: Payer: Self-pay | Admitting: Hematology and Oncology

## 2024-01-18 ENCOUNTER — Ambulatory Visit: Payer: Self-pay | Admitting: Hematology and Oncology

## 2024-01-18 ENCOUNTER — Encounter: Payer: Self-pay | Admitting: *Deleted

## 2024-01-18 DIAGNOSIS — E042 Nontoxic multinodular goiter: Secondary | ICD-10-CM

## 2024-01-18 NOTE — Progress Notes (Signed)
 FU us  thryoid ordered. Biopsy of the thyroid  nodule ordered.

## 2024-01-21 ENCOUNTER — Ambulatory Visit: Admitting: Acute Care

## 2024-01-22 ENCOUNTER — Encounter: Payer: Self-pay | Admitting: *Deleted

## 2024-01-22 ENCOUNTER — Telehealth: Payer: Self-pay

## 2024-01-22 ENCOUNTER — Ambulatory Visit
Admission: RE | Admit: 2024-01-22 | Discharge: 2024-01-22 | Disposition: A | Discharge status: Home / Self Care | Source: Ambulatory Visit | Attending: Hematology and Oncology | Admitting: Hematology and Oncology

## 2024-01-22 ENCOUNTER — Other Ambulatory Visit (HOSPITAL_COMMUNITY)
Admission: RE | Admit: 2024-01-22 | Discharge: 2024-01-22 | Disposition: A | Discharge status: Home / Self Care | Source: Ambulatory Visit | Attending: Radiology | Admitting: Radiology

## 2024-01-22 DIAGNOSIS — E042 Nontoxic multinodular goiter: Secondary | ICD-10-CM | POA: Insufficient documentation

## 2024-01-22 LAB — CYTOLOGY - NON PAP

## 2024-01-22 NOTE — Telephone Encounter (Signed)
 Pt verbally confirmed appt for 7/2

## 2024-01-23 ENCOUNTER — Encounter: Payer: Self-pay | Admitting: *Deleted

## 2024-01-23 ENCOUNTER — Other Ambulatory Visit: Payer: Self-pay

## 2024-01-23 ENCOUNTER — Inpatient Hospital Stay: Attending: Hematology and Oncology | Admitting: Hematology and Oncology

## 2024-01-23 ENCOUNTER — Other Ambulatory Visit: Payer: Self-pay | Admitting: Pharmacist

## 2024-01-23 ENCOUNTER — Other Ambulatory Visit (HOSPITAL_COMMUNITY): Payer: Self-pay

## 2024-01-23 ENCOUNTER — Telehealth: Payer: Self-pay

## 2024-01-23 ENCOUNTER — Inpatient Hospital Stay

## 2024-01-23 VITALS — BP 128/78 | HR 82 | Temp 98.4°F | Resp 17 | Wt 131.1 lb

## 2024-01-23 DIAGNOSIS — Z8052 Family history of malignant neoplasm of bladder: Secondary | ICD-10-CM | POA: Diagnosis not present

## 2024-01-23 DIAGNOSIS — Z170 Estrogen receptor positive status [ER+]: Secondary | ICD-10-CM | POA: Diagnosis not present

## 2024-01-23 DIAGNOSIS — C50919 Malignant neoplasm of unspecified site of unspecified female breast: Secondary | ICD-10-CM

## 2024-01-23 DIAGNOSIS — C50412 Malignant neoplasm of upper-outer quadrant of left female breast: Secondary | ICD-10-CM | POA: Insufficient documentation

## 2024-01-23 DIAGNOSIS — Z8042 Family history of malignant neoplasm of prostate: Secondary | ICD-10-CM | POA: Diagnosis not present

## 2024-01-23 DIAGNOSIS — Z1721 Progesterone receptor positive status: Secondary | ICD-10-CM | POA: Insufficient documentation

## 2024-01-23 DIAGNOSIS — Z79811 Long term (current) use of aromatase inhibitors: Secondary | ICD-10-CM | POA: Diagnosis not present

## 2024-01-23 DIAGNOSIS — Z923 Personal history of irradiation: Secondary | ICD-10-CM | POA: Insufficient documentation

## 2024-01-23 DIAGNOSIS — Z1732 Human epidermal growth factor receptor 2 negative status: Secondary | ICD-10-CM | POA: Diagnosis not present

## 2024-01-23 DIAGNOSIS — K30 Functional dyspepsia: Secondary | ICD-10-CM | POA: Diagnosis not present

## 2024-01-23 DIAGNOSIS — M545 Low back pain, unspecified: Secondary | ICD-10-CM | POA: Diagnosis not present

## 2024-01-23 DIAGNOSIS — R059 Cough, unspecified: Secondary | ICD-10-CM | POA: Insufficient documentation

## 2024-01-23 DIAGNOSIS — C7800 Secondary malignant neoplasm of unspecified lung: Secondary | ICD-10-CM

## 2024-01-23 DIAGNOSIS — M8580 Other specified disorders of bone density and structure, unspecified site: Secondary | ICD-10-CM | POA: Insufficient documentation

## 2024-01-23 DIAGNOSIS — Z79899 Other long term (current) drug therapy: Secondary | ICD-10-CM | POA: Insufficient documentation

## 2024-01-23 DIAGNOSIS — N6489 Other specified disorders of breast: Secondary | ICD-10-CM | POA: Diagnosis not present

## 2024-01-23 DIAGNOSIS — C7951 Secondary malignant neoplasm of bone: Secondary | ICD-10-CM | POA: Insufficient documentation

## 2024-01-23 DIAGNOSIS — Z833 Family history of diabetes mellitus: Secondary | ICD-10-CM | POA: Diagnosis not present

## 2024-01-23 DIAGNOSIS — Z79810 Long term (current) use of selective estrogen receptor modulators (SERMs): Secondary | ICD-10-CM | POA: Insufficient documentation

## 2024-01-23 LAB — CYTOLOGY - NON PAP

## 2024-01-23 MED ORDER — ABEMACICLIB 100 MG PO TABS: 100.0000 mg | ORAL_TABLET | Freq: Two times a day (BID) | ORAL | 3 refills | Status: DC

## 2024-01-23 NOTE — Progress Notes (Signed)
 [facility] CONSULT NOTE  Patient Care Team: Dyane Anthony RAMAN, FNP as PCP - General (Family Medicine) Mikell Katz, MD (Inactive) as Consulting Physician (General Surgery) Jason Charleston, MD (Inactive) as Consulting Physician (Radiation Oncology) Loretha Ash, MD as Consulting Physician (Hematology and Oncology) Glean Stephane BROCKS, RN (Inactive) as Oncology Nurse Navigator Tyree Nanetta SAILOR, RN as Oncology Nurse Navigator Ebbie Cough, MD as Consulting Physician (General Surgery) Dewey Rush, MD as Consulting Physician (Radiation Oncology)  CHIEF COMPLAINTS/PURPOSE OF CONSULTATION:  Newly diagnosed breast cancer  HISTORY OF PRESENTING ILLNESS:  Erin Castillo 58 y.o. female is here because of recent diagnosis of left breast cancer  I reviewed her records extensively and collaborated the history with the patient.  SUMMARY OF ONCOLOGIC HISTORY: Oncology History  Malignant neoplasm of upper-outer quadrant of left breast in female, estrogen receptor positive (HCC)  04/26/2011 Initial Diagnosis   Malignant neoplasm of upper-outer quadrant of left breast in female, estrogen receptor positive (HCC)   11/30/2023 Mammogram   Focal asymmetry with calcifications in the left breast is indeterminate. Additional views with US  is recommended. US  in the breast   12/11/2023 Pathology Results   Left breast needle core biopsy showed invasive poorly differentiated adenocarcinoma grade 3 prognostic showed ER 100% positive strong staining PR 60% positive strong staining Ki-67 of 30% and group 5 HER2 negative    Genetic Testing   [facility] CancerNext-Expanded Panel+RNA was Negative. Report date is 01/06/2024.   The CancerNext-Expanded gene panel offered by [facility] and includes sequencing, rearrangement, and RNA analysis for the following 77 genes: AIP, ALK, APC, ATM, AXIN2, BAP1, BARD1, BMPR1A, BRCA1, BRCA2, BRIP1, CDC73, CDH1, CDK4, CDKN1B, CDKN2A, CEBPA, CHEK2, CTNNA1, DDX41,  DICER1, ETV6, FH, FLCN, GATA2, LZTR1, MAX, MBD4, MEN1, MET, MLH1, MSH2, MSH3, MSH6, MUTYH, NF1, NF2, NTHL1, PALB2, PHOX2B, PMS2, POT1, PRKAR1A, PTCH1, PTEN, RAD51C, RAD51D, RB1, RET, RPS20, RUNX1, SDHA, SDHAF2, SDHB, SDHC, SDHD, SMAD4, SMARCA4, SMARCB1, SMARCE1, STK11, SUFU, TMEM127, TP53, TSC1, TSC2, VHL, and WT1 (sequencing and deletion/duplication); EGFR, HOXB13, KIT, MITF, PDGFRA, POLD1, and POLE (sequencing only); EPCAM and GREM1 (deletion/duplication only).     Discussed the use of AI scribe software for clinical note transcription with the patient, who gave verbal consent to proceed.  History of Present Illness Erin Castillo is a 58 year old female with a history of breast cancer who presents for follow up after her PET CT scan results.   Erin Castillo is a 58 year old female with breast cancer who presents for follow-up on her treatment plan.  She has metastatic breast cancer that is strongly estrogen receptor positive and HER2 non-amplified. She is currently taking Letrozole  and is considering the addition of Verzenio, a CDK 4/6 inhibitor, to her treatment regimen.  She is awaiting dental clearance for a bone strengthener infusion.  She is unsure if the clearance has been received and plans to follow up with her dentist, Dr. CANDIE Ewing.  She inquired about the frequency of scans, which will be conducted every three months, with an initial scan in two months to assess the effectiveness of the medication.   She experiences discomfort in the breast area, described as a tightness behind the breast, similar to previous spasms she experienced years ago. She also mentions increased coughing, which she wonders might be related to her current condition.  She is planning to move at the end of July but intends to continue her treatment with the current medical team, visiting once or twice a month. She has researched oncologists in  the [facility] area as a precautionary measure in case of any  treatment-related issues while away.  Rest of the pertinent 10 point ROS reviewed and negative  MEDICAL HISTORY:  Past Medical History:  Diagnosis Date   Achilles tendonitis    Anxiety    Breast cancer (HCC) 04/20/2011   Left, ER/PR+   Chest pain    hx - no current problem   Depression    Dyspareunia in female    GAD (generalized anxiety disorder)    Heel spur, left    History of radiation therapy    09/18/11 to 11/03/11, left breast   Hypercholesterolemia    Hypotension    Insomnia    occasional   Migraines    Osteopenia    Postmenopausal atrophic vaginitis    Pulmonary nodule 12/2023   right lower lobe   Ulcerative colitis (HCC)     SURGICAL HISTORY: Past Surgical History:  Procedure Laterality Date   BREAST LUMPECTOMY  05/15/11   left breast lumpectomy    CESAREAN SECTION  2000   ENDOBRONCHIAL ULTRASOUND  01/15/2024   Procedure: ENDOBRONCHIAL ULTRASOUND (EBUS);  Surgeon: Shelah Lamar RAMAN, MD;  Location: [facility] ENDOSCOPY;  Service: Pulmonary;;   MASTECTOMY PARTIAL / LUMPECTOMY W/ AXILLARY LYMPHADENECTOMY  10/12   snbx   NASAL SINUS SURGERY  1991   PORT-A-CATH REMOVAL  12/12/2011   Procedure: REMOVAL PORT-A-CATH;  Surgeon: Morene ONEIDA Olives, MD;  Location: [facility] ORS;  Service: General;  Laterality: Right;   PORTACATH PLACEMENT  06/12/2011   Procedure: INSERTION PORT-A-CATH;  Surgeon: Morene ONEIDA Olives, MD;  Location: [facility];  Service: General;  Laterality: Right;  Right Subclavian Vein   SINUS EXPLORATION     remote   VIDEO BRONCHOSCOPY WITH ENDOBRONCHIAL NAVIGATION Right 01/15/2024   Procedure: VIDEO BRONCHOSCOPY WITH ENDOBRONCHIAL NAVIGATION;  Surgeon: Shelah Lamar RAMAN, MD;  Location: [facility] ENDOSCOPY;  Service: Pulmonary;  Laterality: Right;    SOCIAL HISTORY: Social History   Socioeconomic History   Marital status: Married    Spouse name: Not on file   Number of children: Not on file   Years of education: Not on file   Highest education level: Not  on file  Occupational History   Occupation: Print production planner  Tobacco Use   Smoking status: Never    Passive exposure: Never   Smokeless tobacco: Never  Vaping Use   Vaping status: Never Used  Substance and Sexual Activity   Alcohol use: Yes    Alcohol/week: 2.0 - 3.0 standard drinks of alcohol    Types: 2 - 3 Glasses of wine per week   Drug use: No   Sexual activity: Yes    Birth control/protection: Post-menopausal  Other Topics Concern   Not on file  Social History Narrative   3 children   Social Drivers of Health   Financial Resource Strain: Not on file  Food Insecurity: No Food Insecurity (12/20/2023)   Hunger Vital Sign    Worried About Running Out of Food in the Last Year: Never true    Ran Out of Food in the Last Year: Never true  Transportation Needs: No Transportation Needs (12/20/2023)   PRAPARE - Administrator, Civil Service (Medical): No    Lack of Transportation (Non-Medical): No  Physical Activity: Not on file  Stress: Not on file  Social Connections: Not on file  Intimate Partner Violence: Not At Risk (12/20/2023)   Humiliation, Afraid, Rape, and Kick questionnaire    Fear of  Current or Ex-Partner: No    Emotionally Abused: No    Physically Abused: No    Sexually Abused: No    FAMILY HISTORY: Family History  Problem Relation Age of Onset   Bladder Cancer Father 65   Diabetes Father    Prostate cancer Paternal Grandfather     ALLERGIES:  has no known allergies.  MEDICATIONS:  Current Outpatient Medications  Medication Sig Dispense Refill   abemaciclib (VERZENIO) 100 MG tablet Take 1 tablet (100 mg total) by mouth 2 (two) times daily. 56 tablet 3   b complex vitamins tablet Take 1 tablet by mouth daily.      Cholecalciferol (VITAMIN D3) 25 MCG (1000 UT) CAPS 1 capsule.     clobetasol cream (TEMOVATE) 0.05 % Apply 1 Application topically.     letrozole  (FEMARA ) 2.5 MG tablet Take 1 tablet (2.5 mg total) by mouth daily. 90 tablet 3    mesalamine (LIALDA) 1.2 G EC tablet Take 1,200 mg by mouth daily with breakfast.     Multiple Vitamin (MULITIVITAMIN WITH MINERALS) TABS Take 1 tablet by mouth daily.     nitroGLYCERIN  (NITROSTAT ) 0.4 MG SL tablet Place 1 tablet (0.4 mg total) under the tongue every 5 (five) minutes as needed for chest pain. 25 tablet 3   SUMAtriptan (IMITREX) 50 MG tablet Take 50 mg by mouth every 2 (two) hours as needed for migraine. May repeat in 2 hours if headache persists or recurs.     venlafaxine  XR (EFFEXOR -XR) 37.5 MG 24 hr capsule TAKE 2 CAPSULES (75 MG TOTAL) BY MOUTH DAILY. 180 capsule 2   No current facility-administered medications for this visit.    REVIEW OF SYSTEMS:   Constitutional: Denies fevers, chills or abnormal night sweats Eyes: Denies blurriness of vision, double vision or watery eyes Ears, nose, mouth, throat, and face: Denies mucositis or sore throat Respiratory: Denies cough, dyspnea or wheezes Cardiovascular: Denies palpitation, chest discomfort or lower extremity swelling Gastrointestinal:  Denies nausea, heartburn or change in bowel habits Skin: Denies abnormal skin rashes Lymphatics: Denies new lymphadenopathy or easy bruising Neurological:Denies numbness, tingling or new weaknesses Behavioral/Psych: Mood is stable, no new changes  Breast: Denies any palpable lumps or discharge All other systems were reviewed with the patient and are negative.  PHYSICAL EXAMINATION: ECOG PERFORMANCE STATUS: 0 - Asymptomatic  Vitals:   01/23/24 1108  BP: 128/78  Pulse: 82  Resp: 17  Temp: 98.4 F (36.9 C)  SpO2: 100%     Filed Weights   01/23/24 1108  Weight: 131 lb 1.6 oz (59.5 kg)    GENERAL:alert, no distress and comfortable   LABORATORY DATA:  I have reviewed the data as listed Lab Results  Component Value Date   WBC 3.9 (L) 01/15/2024   HGB 13.6 01/15/2024   HCT 42.7 01/15/2024   MCV 88.2 01/15/2024   PLT PLATELET CLUMPS NOTED ON SMEAR, UNABLE TO ESTIMATE  01/15/2024   Lab Results  Component Value Date   NA 141 01/15/2024   K 4.6 01/15/2024   CL 106 01/15/2024   CO2 26 01/15/2024    RADIOGRAPHIC STUDIES: I have personally reviewed the radiological reports and agreed with the findings in the report.  ASSESSMENT AND PLAN:   58 y.o. BRCA negative [place] woman    (1) Status post left lumpectomy and sentinel lymph node biopsy October of 2012 for a T2 N1, stage IIB invasive ductal carcinoma, grade 2,  strongly estrogen and progesterone receptor positive, with an MIB-1 of 9% and  no HER-2/neu amplification.    (2) Treated initially with docetaxel , doxorubicin , and cyclophosphamide  for one cycle, with very poor tolerance   (3) status post cyclophosphamide / docetaxel , for an additional three cycles completed January 2013   (4)  Status post radiation, completed mid-April 2013   (5)  on tamoxifen  as of April 2013-- switched to anastrozole  March 2018, to be discontinued February 2020             (a) bone density January 2019 shows a T score of -1.7  (6) in May 2025 she had a mammogram and an ultrasound which showed a 2 x 1.7 x 2 cm irregular mass highly suggestive of malignancy.  This showed invasive poorly differentiated adenocarcinoma grade 3, ER 100% positive strong staining PR 60% positive strong staining, Ki-67 of 30% group 5 HER2 negative  (7) we did staging scans given concern for local recurrence which showed right lower lobe lung nodule highly suspicious for metastatic disease versus a second primary.  Hence we proceeded with the PET/CT which showed multiple sites concerning for metastatic disease including mediastinal adenopathy, lung, bone disease.  (8)Bronchoscopy confirmed met breast cancer, ER pos, PR pos, Her 2 1+   Assessment and Plan Assessment & Plan Breast cancer, ER positive, HER2 negative ER positive, HER2 negative breast cancer confirmed. Strongly estrogen receptor positive, weakly progesterone receptor positive, HER2  negative (1+). - Start letrozole  and Verzenio (abemaciclib) as frontline therapy. - Explained Verzenio side effects: nausea, dyspepsia, diarrhea, transaminitis risk. - Verzenio not expected to cause significant immunosuppression. - Discussed dental clearance for bone strengthener infusion. - Draw tumor markers CA 27-29 and CA 15-3 today; monitor monthly if elevated. - Start Verzenio at 100 mg PO BID - Perform scans every 2-3 months for disease progression.  Indeterminate thyroid  nodule - Await final pathology results of thyroid  nodule biopsy.  Time spent: 30 min  All questions were answered. The patient knows to call the clinic with any problems, questions or concerns.    Amber Stalls, MD 01/23/24

## 2024-01-23 NOTE — Telephone Encounter (Signed)
 Oral Oncology Patient Advocate Encounter  Prior Authorization for Verzenio has been approved.    PA# [id number] Effective dates: 01/23/24 through 01/22/2025  [facility] [place],  CPhT-Adv  she/her/hers [facility]  [facility] Services Pharmacy Technician Patient Advocate Specialist III [facility] [email or web address]  Fax: [phone]

## 2024-01-23 NOTE — Telephone Encounter (Signed)
 Oral Oncology Patient Advocate Encounter   Received notification that prior authorization for Verzenio is required.   PA submitted on 01/23/2024 Key [id number] Status is pending      Charlott Hamilton,  CPhT-Adv  she/her/hers [facility]  [facility] Specialty Pharmacy Services Pharmacy Technician Patient Advocate Specialist III [facility] [email or web address]  Fax: [phone]

## 2024-01-23 NOTE — Telephone Encounter (Signed)
 Oral Oncology Patient Advocate Encounter   Was successful in obtaining a copay card for Verzenio. This copay card will make the patients copay as little as $0.  The copay card has an annual maximum benefit of $9200.00   The billing information is as follows and has been shared with  [facility]   RxBin: [id number] PCN: [id number] Member ID: [id number] Group ID: [id number]    Charlott Hamilton,  CPhT-Adv  she/her/hers [place]  [facility] Special[facility] Pharmacy Services Pharmacy Technician Patient Advocate Specialist III [facility] [email or web address]  Fax: [phone]

## 2024-01-24 ENCOUNTER — Ambulatory Visit: Payer: Self-pay | Admitting: Hematology and Oncology

## 2024-01-24 LAB — CANCER ANTIGEN 15-3: CA 15-3: 62.5 U/mL — ABNORMAL HIGH (ref 0.0–25.0)

## 2024-01-24 LAB — CYTOLOGY - NON PAP

## 2024-01-24 LAB — CANCER ANTIGEN 27.29: CA 27.29: 62.6 U/mL — ABNORMAL HIGH (ref 0.0–38.6)

## 2024-01-28 ENCOUNTER — Inpatient Hospital Stay: Admitting: Pharmacist

## 2024-01-28 VITALS — BP 121/77 | HR 88 | Temp 101.0°F | Resp 17 | Wt 132.7 lb

## 2024-01-28 DIAGNOSIS — C50919 Malignant neoplasm of unspecified site of unspecified female breast: Secondary | ICD-10-CM

## 2024-01-28 DIAGNOSIS — C50412 Malignant neoplasm of upper-outer quadrant of left female breast: Secondary | ICD-10-CM | POA: Diagnosis not present

## 2024-01-28 MED ORDER — ONDANSETRON HCL 8 MG PO TABS: 8.0000 mg | ORAL_TABLET | Freq: Three times a day (TID) | ORAL | 2 refills | Status: AC | PRN

## 2024-01-28 NOTE — Progress Notes (Signed)
 [facility]       Telephone: [phone]?Fax: [phone]   Oncology Clinical Pharmacist Practitioner Initial Assessment  Erin Castillo is a 58 y.o. female with a diagnosis of breast cancer. They were contacted today via in-person visit. She is accompanied by her daughter  [name]  (Verzenio ) is being used appropriately for treatment of metastatic breast cancer by Dr. Amber Stalls.      Wt Readings from Last 1 Encounters:  01/28/24 132 lb 11.2 oz (60.2 kg)    Estimated body surface area is 1.65 meters squared as calculated from the following:   Height as of 01/15/24: 5' 4 (1.626 m).   Weight as of this encounter: 132 lb 11.2 oz (60.2 kg).  The dosing regimen is 100 mg by mouth every 12 hours on days 1 to 28 of a 28-day cycle. This is being given  in combination with letrozole  and zoledronic acid (will start after dental clearance. It is planned to continue until disease progression or unacceptable toxicity. Prescription dose and frequency assessed for appropriateness.  Patient has agreed to treatment which is documented in physician note on 01/23/24. Counseled patient on administration, dosing, side effects, monitoring, drug-food interactions, safe handling, storage, and disposal.  [facility]   Dose Modifications Dr. Stalls is starting at a reduced dose of 100 mg by mouth every 12 hours  Access Assessment Khrista Braun will be receiving abemaciclib  through [facility] Insurance Concerns: none Start date if known: TBD -- may start once received  Adherence Assessment Reviewed importance on keeping a med schedule and plan for any missed doses Barriers to adherence identified? No  Allergies No Known Allergies  Vitals    01/28/2024    3:58 PM 01/23/2024   11:08 AM 01/15/2024   11:00 AM  Oncology Vitals  Weight 60.192 kg 59.467 kg   Weight (lbs) 132 lbs 11 oz 131 lbs 2 oz   BMI 22.78  kg/m2 22.5 kg/m2   Temp 101 F (38.3 C) 98.4 F (36.9 C) 97.5 F (36.4 C)  Pulse Rate 88 82 99  BP 121/77 128/78 106/61  Resp 17 17 19   SpO2 100 % 100 % 95 %  BSA (m2) 1.65 m2 1.64 m2      Laboratory Data    Latest Ref Rng & Units 01/15/2024    7:18 AM 05/30/2018    1:49 PM 07/20/2017    9:30 AM  CBC EXTENDED  WBC 4.0 - 10.5 K/uL 3.9  7.5  4.7   RBC 3.87 - 5.11 MIL/uL 4.84  5.13  5.25   Hemoglobin 12.0 - 15.0 g/dL 86.3  85.7  85.2   HCT 36.0 - 46.0 % 42.7  44.6  45.0   Platelets 150 - 400 K/uL PLATELET CLUMPS NOTED ON SMEAR, UNABLE TO ESTIMATE  354  256   NEUT# 1.7 - 7.7 K/uL  5.0  3.2   Lymph# 0.7 - 4.0 K/uL  1.7  1.0        Latest Ref Rng & Units 01/15/2024    7:18 AM 05/30/2018    1:49 PM 07/20/2017    9:30 AM  CMP  Glucose 70 - 99 mg/dL 97  97  79   BUN 6 - 20 mg/dL 11  15  86.5   Creatinine 0.44 - 1.00 mg/dL 9.24  9.25  0.8   Sodium 135 - 145 mmol/L 141  140  142   Potassium 3.5 - 5.1 mmol/L 4.6  4.1  3.7   Chloride 98 - 111 mmol/L 106  105    CO2 22 - 32 mmol/L 26  28  28    Calcium 8.9 - 10.3 mg/dL 9.1  9.5  9.0   Total Protein 6.5 - 8.1 g/dL  7.4  7.0   Total Bilirubin 0.3 - 1.2 mg/dL  <9.7  9.63   Alkaline Phos 38 - 126 U/L  88  66   AST 15 - 41 U/L  21  17   ALT 0 - 44 U/L  25  16    No results found for: MG Lab Results  Component Value Date   CA2729 62.6 (H) 01/23/2024    Contraindications Contraindications were reviewed? Yes Contraindications to therapy were identified? No   Safety Precautions The following safety precautions for the use of abemaciclib  were reviewed:  Changes in kidney function: importance of drinking plenty of fluids and monitoring urine output Diarrhea: we reviewed that diarrhea is common with abemaciclib  and confirmed that she does have loperamide (Imodium) at home.  We reviewed how to take this medication PRN and gave her information on abemaciclib  Decreased white blood cells (WBCs) and increased risk for infection: we  discussed the importance of having a thermometer and what the Centers for Disease Control and Prevention (CDC) considers a fever which is 100.82F (38C) or higher.  Gave patient 24/7 triage line to call if any fevers or symptoms Decreased hemoglobin, part of red blood cells that carry iron and oxygen Fatigue Nausea and Vomiting Hepatotoxicity: reviewed to contact clinic for RUQ pain that will not subside, yellowing of eyes/skin Decreased appetite or weight loss Abdominal pain Decreased platelet count and increased risk for bleeding Venous thromboembolism (VTE): reviewed signs of deep vein thrombosis (DVT) such as leg swelling, redness, pain, or tenderness and signs of pulmonary embolism (PE) such as shortness of breath, rapid or irregular heartbeat, cough, chest pain, or lightheadedness ILD/Pneumonitis: we reviewed potential symptoms including cough, shortness, and fatigue. Handling body fluids and waste Pregnancy, sexual activity, and contraception Avoid grapefruit products Reviewed to take the medication every 12 hours (with food sometimes can be easier on the stomach) and to take it at the same time every day. Discussed proper storage and handling of abemaciclib   Medication Reconciliation Current Outpatient Medications  Medication Sig Dispense Refill   b complex vitamins tablet Take 1 tablet by mouth daily.      Cholecalciferol (VITAMIN D3) 25 MCG (1000 UT) CAPS 1 capsule.     clobetasol cream (TEMOVATE) 0.05 % Apply 1 Application topically.     letrozole  (FEMARA ) 2.5 MG tablet Take 1 tablet (2.5 mg total) by mouth daily. 90 tablet 3   melatonin 5 MG TABS Take 5 mg by mouth.     mesalamine (LIALDA) 1.2 G EC tablet Take 1,200 mg by mouth daily with breakfast.     Multiple Vitamin (MULITIVITAMIN WITH MINERALS) TABS Take 1 tablet by mouth daily.     nitroGLYCERIN  (NITROSTAT ) 0.4 MG SL tablet Place 1 tablet (0.4 mg total) under the tongue every 5 (five) minutes as needed for chest pain. 25  tablet 3   ondansetron  (ZOFRAN ) 8 MG tablet Take 1 tablet (8 mg total) by mouth every 8 (eight) hours as needed for nausea or vomiting. 30 tablet 2   SUMAtriptan (IMITREX) 50 MG tablet Take 50 mg by mouth every 2 (two) hours as needed for migraine. May repeat in 2 hours if headache persists or recurs.     venlafaxine  XR (EFFEXOR -XR)  37.5 MG 24 hr capsule TAKE 2 CAPSULES (75 MG TOTAL) BY MOUTH DAILY. 180 capsule 2   abemaciclib  (VERZENIO ) 100 MG tablet Take 1 tablet (100 mg total) by mouth 2 (two) times daily. (Patient not taking: Reported on 01/28/2024) 56 tablet 3   No current facility-administered medications for this visit.    Medication reconciliation is based on the patient's most recent medication list in the electronic medical record (EMR) including herbal products and OTC medications.   The patient's medication list was reviewed today with the patient? Yes   Drug-drug interactions (DDIs) DDIs were evaluated? Yes Significant DDIs identified? No   Drug-Food Interactions Drug-food interactions were evaluated? Yes Drug-food interactions identified? Grapefruit products  Follow-up Plan  Patient education handout given to patient Start abemaciclib  100 mg by mouth every 12 hours. [facility] date [facility] Continue letrozole  2.5 mg by mouth daily started ~01/14/24 Start zoledronic acid 4 mg IV every 12 weeks in approximately 3 weeks. Patient states dentist has cleared her and sent clearance form back to Dr. Loretha Monitor for abemaciclib  side effects Use loperamide (Imodium) PRN for loose stools Prescription sent for ondansetron  (Zofran ) PRN for nausea Distress thermometer completed during in person visit and reviewed with patient. Due to score, social work referral has not been sent.  Will add Labs, Dr. Loretha visit, and zoledronic acid in 3 weeks. Ms. Colton will be traveling in two weeks when labs would normally be due for abemaciclib  Jerianne Morici can follow up  with clinical pharmacy as deemed necessary by Dr. Amber Iruku going forward   Rose-Marie Welshans participated in the discussion, expressed understanding, and voiced agreement with the above plan. All questions were answered to her satisfaction. The patient was advised to contact the clinic at [phone] with any questions or concerns prior to her return visit.   I spent 60 minutes assessing the patient.  Malayia Spizzirri A. Lucila, PharmD, BCOP, CPP  Norleen DELENA Lucila, RPH-CPP, 01/28/2024 4:33 PM  **Disclaimer: This note was dictated with voice recognition software. Similar sounding words can inadvertently be transcribed and this note may contain transcription errors which may not have been corrected upon publication of note.**

## 2024-01-29 NOTE — Telephone Encounter (Signed)
 Oral Oncology Patient Advocate Encounter  I followed up with [facility] for Ver[facility]io   status. After calling I requested the prescription be expedited. I was informed it will need a cost exceeds maximum  approval . They are aware the PA has been approved but now this override is needed to proceed with filling he prescription. They expect this to be done today.   I will continue to follow-up.

## 2024-02-12 ENCOUNTER — Other Ambulatory Visit: Payer: Self-pay | Admitting: Hematology and Oncology

## 2024-02-12 NOTE — Progress Notes (Unsigned)
 [facility] CONSULT NOTE  Patient Care Team: Dyane Anthony RAMAN, FNP as PCP - General (Family Medicine) Mikell Katz, MD (Inactive) as Consulting Physician (General Surgery) Jason Charleston, MD (Inactive) as Consulting Physician (Radiation Oncology) Loretha Ash, MD as Consulting Physician (Hematology and Oncology) Glean Stephane BROCKS, RN (Inactive) as Oncology Nurse Navigator Tyree Nanetta SAILOR, RN as Oncology Nurse Navigator Ebbie Cough, MD as Consulting Physician (General Surgery) Dewey Rush, MD as Consulting Physician (Radiation Oncology)  CHIEF COMPLAINTS/PURPOSE OF CONSULTATION:  Newly diagnosed breast cancer  HISTORY OF PRESENTING ILLNESS:  Laurene Cameron 58 y.o. female is here because of recent diagnosis of left breast cancer  I reviewed her records extensively and collaborated the history with the patient.  SUMMARY OF ONCOLOGIC HISTORY: Oncology History  Malignant neoplasm of upper-outer quadrant of left breast in female, estrogen receptor positive (HCC)  04/26/2011 Initial Diagnosis   Malignant neoplasm of upper-outer quadrant of left breast in female, estrogen receptor positive (HCC)   11/30/2023 Mammogram   Focal asymmetry with calcifications in the left breast is indeterminate. Additional views with US  is recommended. US  in the breast   12/11/2023 Pathology Results   Left breast needle core biopsy showed invasive poorly differentiated adenocarcinoma grade 3 prognostic showed ER 100% positive strong staining PR 60% positive strong staining Ki-67 of 30% and group 5 HER2 negative    Genetic Testing   [facility] CancerNext-Expanded Panel+RNA was Negative. Report date is 01/06/2024.   The CancerNext-Expanded gene panel offered by [facility] and includes sequencing, rearrangement, and RNA analysis for the following 77 genes: AIP, ALK, APC, ATM, AXIN2, BAP1, BARD1, BMPR1A, BRCA1, BRCA2, BRIP1, CDC73, CDH1, CDK4, CDKN1B, CDKN2A, CEBPA, CHEK2, CTNNA1, DDX41,  DICER1, ETV6, FH, FLCN, GATA2, LZTR1, MAX, MBD4, MEN1, MET, MLH1, MSH2, MSH3, MSH6, MUTYH, NF1, NF2, NTHL1, PALB2, PHOX2B, PMS2, POT1, PRKAR1A, PTCH1, PTEN, RAD51C, RAD51D, RB1, RET, RPS20, RUNX1, SDHA, SDHAF2, SDHB, SDHC, SDHD, SMAD4, SMARCA4, SMARCB1, SMARCE1, STK11, SUFU, TMEM127, TP53, TSC1, TSC2, VHL, and WT1 (sequencing and deletion/duplication); EGFR, HOXB13, KIT, MITF, PDGFRA, POLD1, and POLE (sequencing only); EPCAM and GREM1 (deletion/duplication only).     Discussed the use of AI scribe software for clinical note transcription with the patient, who gave verbal consent to proceed.  History of Present Illness Lillieanna Tuohy is a 58 year old female with a history of breast cancer who presents for follow up after her PET CT scan results.   Lindalou Heyboer is a 58 year old female with breast cancer who presents for follow-up on her treatment plan.  She has metastatic breast cancer that is strongly estrogen receptor positive and HER2 non-amplified. She is currently taking Letrozole  and is considering the addition of Verzenio , a CDK 4/6 inhibitor, to her treatment regimen.  She is awaiting dental clearance for a bone strengthener infusion.  She is unsure if the clearance has been received and plans to follow up with her dentist, Dr. CANDIE Ewing.  She inquired about the frequency of scans, which will be conducted every three months, with an initial scan in two months to assess the effectiveness of the medication.   She experiences discomfort in the breast area, described as a tightness behind the breast, similar to previous spasms she experienced years ago. She also mentions increased coughing, which she wonders might be related to her current condition.  She is planning to move at the end of July but intends to continue her treatment with the current medical team, visiting once or twice a month. She has researched oncologists in  the [facility] area as a precautionary measure in case of any  treatment-related issues while away.  Rest of the pertinent 10 point ROS reviewed and negative  MEDICAL HISTORY:  Past Medical History:  Diagnosis Date   Achilles tendonitis    Anxiety    Breast cancer (HCC) 04/20/2011   Left, ER/PR+   Chest pain    hx - no current problem   Depression    Dyspareunia in female    GAD (generalized anxiety disorder)    Heel spur, left    History of radiation therapy    09/18/11 to 11/03/11, left breast   Hypercholesterolemia    Hypotension    Insomnia    occasional   Migraines    Osteopenia    Postmenopausal atrophic vaginitis    Pulmonary nodule 12/2023   right lower lobe   Ulcerative colitis (HCC)     SURGICAL HISTORY: Past Surgical History:  Procedure Laterality Date   BREAST LUMPECTOMY  05/15/11   left breast lumpectomy    CESAREAN SECTION  2000   ENDOBRONCHIAL ULTRASOUND  01/15/2024   Procedure: ENDOBRONCHIAL ULTRASOUND (EBUS);  Surgeon: Shelah Lamar RAMAN, MD;  Location: [facility] ENDOSCOPY;  Service: Pulmonary;;   MASTECTOMY PARTIAL / LUMPECTOMY W/ AXILLARY LYMPHADENECTOMY  10/12   snbx   NASAL SINUS SURGERY  1991   PORT-A-CATH REMOVAL  12/12/2011   Procedure: REMOVAL PORT-A-CATH;  Surgeon: Morene ONEIDA Olives, MD;  Location: [facility] ORS;  Service: General;  Laterality: Right;   PORTACATH PLACEMENT  06/12/2011   Procedure: INSERTION PORT-A-CATH;  Surgeon: Morene ONEIDA Olives, MD;  Location: [facility];  Service: General;  Laterality: Right;  Right Subclavian Vein   SINUS EXPLORATION     remote   VIDEO BRONCHOSCOPY WITH ENDOBRONCHIAL NAVIGATION Right 01/15/2024   Procedure: VIDEO BRONCHOSCOPY WITH ENDOBRONCHIAL NAVIGATION;  Surgeon: Shelah Lamar RAMAN, MD;  Location: [facility] ENDOSCOPY;  Service: Pulmonary;  Laterality: Right;    SOCIAL HISTORY: Social History   Socioeconomic History   Marital status: Married    Spouse name: Not on file   Number of children: Not on file   Years of education: Not on file   Highest education level: Not  on file  Occupational History   Occupation: Print production planner  Tobacco Use   Smoking status: Never    Passive exposure: Never   Smokeless tobacco: Never  Vaping Use   Vaping status: Never Used  Substance and Sexual Activity   Alcohol use: Yes    Alcohol/week: 2.0 - 3.0 standard drinks of alcohol    Types: 2 - 3 Glasses of wine per week   Drug use: No   Sexual activity: Yes    Birth control/protection: Post-menopausal  Other Topics Concern   Not on file  Social History Narrative   3 children   Social Drivers of Health   Financial Resource Strain: Not on file  Food Insecurity: No Food Insecurity (12/20/2023)   Hunger Vital Sign    Worried About Running Out of Food in the Last Year: Never true    Ran Out of Food in the Last Year: Never true  Transportation Needs: No Transportation Needs (12/20/2023)   PRAPARE - Administrator, Civil Service (Medical): No    Lack of Transportation (Non-Medical): No  Physical Activity: Not on file  Stress: Not on file  Social Connections: Not on file  Intimate Partner Violence: Not At Risk (12/20/2023)   Humiliation, Afraid, Rape, and Kick questionnaire    Fear of  Current or Ex-Partner: No    Emotionally Abused: No    Physically Abused: No    Sexually Abused: No    FAMILY HISTORY: Family History  Problem Relation Age of Onset   Bladder Cancer Father 9   Diabetes Father    Prostate cancer Paternal Grandfather     ALLERGIES:  has no known allergies.  MEDICATIONS:  Current Outpatient Medications  Medication Sig Dispense Refill   abemaciclib  (VERZENIO ) 100 MG tablet Take 1 tablet (100 mg total) by mouth 2 (two) times daily. (Patient not taking: Reported on 01/28/2024) 56 tablet 3   b complex vitamins tablet Take 1 tablet by mouth daily.      Cholecalciferol (VITAMIN D3) 25 MCG (1000 UT) CAPS 1 capsule.     clobetasol cream (TEMOVATE) 0.05 % Apply 1 Application topically.     letrozole  (FEMARA ) 2.5 MG tablet Take 1 tablet (2.5 mg  total) by mouth daily. 90 tablet 3   melatonin 5 MG TABS Take 5 mg by mouth.     mesalamine (LIALDA) 1.2 G EC tablet Take 1,200 mg by mouth daily with breakfast.     Multiple Vitamin (MULITIVITAMIN WITH MINERALS) TABS Take 1 tablet by mouth daily.     nitroGLYCERIN  (NITROSTAT ) 0.4 MG SL tablet Place 1 tablet (0.4 mg total) under the tongue every 5 (five) minutes as needed for chest pain. 25 tablet 3   ondansetron  (ZOFRAN ) 8 MG tablet Take 1 tablet (8 mg total) by mouth every 8 (eight) hours as needed for nausea or vomiting. 30 tablet 2   SUMAtriptan (IMITREX) 50 MG tablet Take 50 mg by mouth every 2 (two) hours as needed for migraine. May repeat in 2 hours if headache persists or recurs.     venlafaxine  XR (EFFEXOR -XR) 37.5 MG 24 hr capsule TAKE 2 CAPSULES (75 MG TOTAL) BY MOUTH DAILY. 180 capsule 2   No current facility-administered medications for this visit.    REVIEW OF SYSTEMS:   Constitutional: Denies fevers, chills or abnormal night sweats Eyes: Denies blurriness of vision, double vision or watery eyes Ears, nose, mouth, throat, and face: Denies mucositis or sore throat Respiratory: Denies cough, dyspnea or wheezes Cardiovascular: Denies palpitation, chest discomfort or lower extremity swelling Gastrointestinal:  Denies nausea, heartburn or change in bowel habits Skin: Denies abnormal skin rashes Lymphatics: Denies new lymphadenopathy or easy bruising Neurological:Denies numbness, tingling or new weaknesses Behavioral/Psych: Mood is stable, no new changes  Breast: Denies any palpable lumps or discharge All other systems were reviewed with the patient and are negative.  PHYSICAL EXAMINATION: ECOG PERFORMANCE STATUS: 0 - Asymptomatic  There were no vitals filed for this visit.    There were no vitals filed for this visit.   GENERAL:alert, no distress and comfortable   LABORATORY DATA:  I have reviewed the data as listed Lab Results  Component Value Date   WBC 3.9 (L)  01/15/2024   HGB 13.6 01/15/2024   HCT 42.7 01/15/2024   MCV 88.2 01/15/2024   PLT PLATELET CLUMPS NOTED ON SMEAR, UNABLE TO ESTIMATE 01/15/2024   Lab Results  Component Value Date   NA 141 01/15/2024   K 4.6 01/15/2024   CL 106 01/15/2024   CO2 26 01/15/2024    RADIOGRAPHIC STUDIES: I have personally reviewed the radiological reports and agreed with the findings in the report.  ASSESSMENT AND PLAN:   58 y.o. BRCA negative [place] woman    (1) Status post left lumpectomy and sentinel lymph node biopsy October of 2012  for a T2 N1, stage IIB invasive ductal carcinoma, grade 2,  strongly estrogen and progesterone receptor positive, with an MIB-1 of 9% and no HER-2/neu amplification.    (2) Treated initially with docetaxel , doxorubicin , and cyclophosphamide  for one cycle, with very poor tolerance   (3) status post cyclophosphamide / docetaxel , for an additional three cycles completed January 2013   (4)  Status post radiation, completed mid-April 2013   (5)  on tamoxifen  as of April 2013-- switched to anastrozole  March 2018, to be discontinued February 2020             (a) bone density January 2019 shows a T score of -1.7  (6) in May 2025 she had a mammogram and an ultrasound which showed a 2 x 1.7 x 2 cm irregular mass highly suggestive of malignancy.  This showed invasive poorly differentiated adenocarcinoma grade 3, ER 100% positive strong staining PR 60% positive strong staining, Ki-67 of 30% group 5 HER2 negative  (7) we did staging scans given concern for local recurrence which showed right lower lobe lung nodule highly suspicious for metastatic disease versus a second primary.  Hence we proceeded with the PET/CT which showed multiple sites concerning for metastatic disease including mediastinal adenopathy, lung, bone disease.  (8)Bronchoscopy confirmed met breast cancer, ER pos, PR pos, Her 2 1+   Assessment and Plan Assessment & Plan Breast cancer, ER positive, HER2  negative ER positive, HER2 negative breast cancer confirmed. Strongly estrogen receptor positive, weakly progesterone receptor positive, HER2 negative (1+). - Start letrozole  and Verzenio  (abemaciclib ) as frontline therapy. - Explained Verzenio  side effects: nausea, dyspepsia, diarrhea, transaminitis risk. - Verzenio  not expected to cause significant immunosuppression. - Discussed dental clearance for bone strengthener infusion. - Draw tumor markers CA 27-29 and CA 15-3 today; monitor monthly if elevated. - Start Verzenio  at 100 mg PO BID - Perform scans every 2-3 months for disease progression.  Indeterminate thyroid  nodule - Await final pathology results of thyroid  nodule biopsy.  Time spent: 30 min  All questions were answered. The patient knows to call the clinic with any problems, questions or concerns.    Amber Stalls, MD 02/12/24

## 2024-02-15 ENCOUNTER — Telehealth: Payer: Self-pay

## 2024-02-18 ENCOUNTER — Other Ambulatory Visit: Payer: Self-pay | Admitting: Pharmacist

## 2024-02-18 ENCOUNTER — Inpatient Hospital Stay (HOSPITAL_BASED_OUTPATIENT_CLINIC_OR_DEPARTMENT_OTHER): Admitting: Hematology and Oncology

## 2024-02-18 ENCOUNTER — Inpatient Hospital Stay

## 2024-02-18 VITALS — BP 114/79 | HR 82 | Temp 98.2°F | Resp 17 | Wt 132.8 lb

## 2024-02-18 DIAGNOSIS — C50919 Malignant neoplasm of unspecified site of unspecified female breast: Secondary | ICD-10-CM | POA: Diagnosis not present

## 2024-02-18 DIAGNOSIS — C7800 Secondary malignant neoplasm of unspecified lung: Secondary | ICD-10-CM | POA: Diagnosis not present

## 2024-02-18 DIAGNOSIS — C50412 Malignant neoplasm of upper-outer quadrant of left female breast: Secondary | ICD-10-CM | POA: Diagnosis not present

## 2024-02-18 DIAGNOSIS — Z170 Estrogen receptor positive status [ER+]: Secondary | ICD-10-CM

## 2024-02-18 LAB — CBC WITH DIFFERENTIAL (CANCER CENTER ONLY)
Abs Immature Granulocytes: 0.01 K/uL (ref 0.00–0.07)
Basophils Absolute: 0 K/uL (ref 0.0–0.1)
Basophils Relative: 1 %
Eosinophils Absolute: 0.1 K/uL (ref 0.0–0.5)
Eosinophils Relative: 1 %
HCT: 38.6 % (ref 36.0–46.0)
Hemoglobin: 12.9 g/dL (ref 12.0–15.0)
Immature Granulocytes: 0 %
Lymphocytes Relative: 37 %
Lymphs Abs: 1.6 K/uL (ref 0.7–4.0)
MCH: 28.2 pg (ref 26.0–34.0)
MCHC: 33.4 g/dL (ref 30.0–36.0)
MCV: 84.3 fL (ref 80.0–100.0)
Monocytes Absolute: 0.3 K/uL (ref 0.1–1.0)
Monocytes Relative: 7 %
Neutro Abs: 2.4 K/uL (ref 1.7–7.7)
Neutrophils Relative %: 54 %
Platelet Count: 211 K/uL (ref 150–400)
RBC: 4.58 MIL/uL (ref 3.87–5.11)
RDW: 15.5 % (ref 11.5–15.5)
WBC Count: 4.4 K/uL (ref 4.0–10.5)
nRBC: 0 % (ref 0.0–0.2)

## 2024-02-18 LAB — CMP (CANCER CENTER ONLY)
ALT: 11 U/L (ref 0–44)
AST: 15 U/L (ref 15–41)
Albumin: 4.1 g/dL (ref 3.5–5.0)
Alkaline Phosphatase: 59 U/L (ref 38–126)
Anion gap: 4 — ABNORMAL LOW (ref 5–15)
BUN: 16 mg/dL (ref 6–20)
CO2: 31 mmol/L (ref 22–32)
Calcium: 9.2 mg/dL (ref 8.9–10.3)
Chloride: 106 mmol/L (ref 98–111)
Creatinine: 0.95 mg/dL (ref 0.44–1.00)
GFR, Estimated: >60 mL/min (ref >60)
Glucose, Bld: 86 mg/dL (ref 70–99)
Potassium: 4.1 mmol/L (ref 3.5–5.1)
Sodium: 141 mmol/L (ref 135–145)
Total Bilirubin: 0.4 mg/dL (ref 0.0–1.2)
Total Protein: 7 g/dL (ref 6.5–8.1)

## 2024-02-18 MED ORDER — ABEMACICLIB 150 MG PO TABS: 150.0000 mg | ORAL_TABLET | Freq: Two times a day (BID) | ORAL | 3 refills | Status: DC

## 2024-02-18 NOTE — Progress Notes (Signed)
 [facility] CONSULT NOTE  Patient Care Team: Dyane Anthony RAMAN, FNP as PCP - General (Family Medicine) Jason Charleston, MD (Inactive) as Consulting Physician (Radiation Oncology) Loretha Ash, MD as Consulting Physician (Hematology and Oncology) Glean Stephane BROCKS, RN (Inactive) as Oncology Nurse Navigator Tyree Nanetta SAILOR, RN as Oncology Nurse Navigator Ebbie Cough, MD as Consulting Physician (General Surgery) Dewey Rush, MD as Consulting Physician (Radiation Oncology)  CHIEF COMPLAINTS/PURPOSE OF CONSULTATION:  Newly diagnosed breast cancer  HISTORY OF PRESENTING ILLNESS:  Erin Castillo 58 y.o. female is here because of recent diagnosis of left breast cancer  I reviewed her records extensively and collaborated the history with the patient.  SUMMARY OF ONCOLOGIC HISTORY: Oncology History  Malignant neoplasm of upper-outer quadrant of left breast in female, estrogen receptor positive (HCC)  04/26/2011 Initial Diagnosis   Malignant neoplasm of upper-outer quadrant of left breast in female, estrogen receptor positive (HCC)   11/30/2023 Mammogram   Focal asymmetry with calcifications in the left breast is indeterminate. Additional views with US  is recommended. US  in the breast   12/11/2023 Pathology Results   Left breast needle core biopsy showed invasive poorly differentiated adenocarcinoma grade 3 prognostic showed ER 100% positive strong staining PR 60% positive strong staining Ki-67 of 30% and group 5 HER2 negative    Genetic Testing   [facility] CancerNext-Expanded Panel+RNA was Negative. Report date is 01/06/2024.   The CancerNext-Expanded gene panel offered by [facility] and includes sequencing, rearrangement, and RNA analysis for the following 77 genes: AIP, ALK, APC, ATM, AXIN2, BAP1, BARD1, BMPR1A, BRCA1, BRCA2, BRIP1, CDC73, CDH1, CDK4, CDKN1B, CDKN2A, CEBPA, CHEK2, CTNNA1, DDX41, DICER1, ETV6, FH, FLCN, GATA2, LZTR1, MAX, MBD4, MEN1, MET, MLH1, MSH2, MSH3,  MSH6, MUTYH, NF1, NF2, NTHL1, PALB2, PHOX2B, PMS2, POT1, PRKAR1A, PTCH1, PTEN, RAD51C, RAD51D, RB1, RET, RPS20, RUNX1, SDHA, SDHAF2, SDHB, SDHC, SDHD, SMAD4, SMARCA4, SMARCB1, SMARCE1, STK11, SUFU, TMEM127, TP53, TSC1, TSC2, VHL, and WT1 (sequencing and deletion/duplication); EGFR, HOXB13, KIT, MITF, PDGFRA, POLD1, and POLE (sequencing only); EPCAM and GREM1 (deletion/duplication only).     Discussed the use of AI scribe software for clinical note transcription with the patient, who gave verbal consent to proceed.  History of Present Illness Erin Castillo is a 58 year old female with a history of breast cancer who presents for follow up while on verzenio  and letrozole .  She has been taking Verzenio  at a dose of two pills per day for approximately two weeks, starting around February 04, 2024. She also started Letrozole  about two weeks prior to Verzenio , around January 22, 2024. She experiences mild stomach upset and a tightening feeling in her joints. Additionally, she has mild lower back pain.  Her experience with Verzenio  has been positive with minimal side effects. She has an upset stomach, particularly at night and in the morning, and occasional loose stools. She also notes cramps or a tightening sensation in her hands, which she attributes to the medication. She ensures she is drinking plenty of water.  She encountered difficulties with her medication supply, initially receiving only a seven-day supply of Verzenio , which was later resolved to a three-week supply after multiple phone calls. She expresses concern about the logistics of receiving medication while traveling.  She recently returned from a vacation, during which she maintained her medication regimen. Her family was surprised by her appearance, as they expected her to look unwell, but she feels well and is not experiencing significant hair loss or other visible side effects. Her vision has improved and she is  able to take deep breaths without  difficulty.  Rest of the pertinent 10 point ROS reviewed and negative  MEDICAL HISTORY:  Past Medical History:  Diagnosis Date   Achilles tendonitis    Anxiety    Breast cancer (HCC) 04/20/2011   Left, ER/PR+   Chest pain    hx - no current problem   Depression    Dyspareunia in female    GAD (generalized anxiety disorder)    Heel spur, left    History of radiation therapy    09/18/11 to 11/03/11, left breast   Hypercholesterolemia    Hypotension    Insomnia    occasional   Migraines    Osteopenia    Postmenopausal atrophic vaginitis    Pulmonary nodule 12/2023   right lower lobe   Ulcerative colitis (HCC)     SURGICAL HISTORY: Past Surgical History:  Procedure Laterality Date   BREAST LUMPECTOMY  05/15/11   left breast lumpectomy    CESAREAN SECTION  2000   ENDOBRONCHIAL ULTRASOUND  01/15/2024   Procedure: ENDOBRONCHIAL ULTRASOUND (EBUS);  Surgeon: Shelah Lamar RAMAN, MD;  Location: [facility] ENDOSCOPY;  Service: Pulmonary;;   MASTECTOMY PARTIAL / LUMPECTOMY W/ AXILLARY LYMPHADENECTOMY  10/12   snbx   NASAL SINUS SURGERY  1991   PORT-A-CATH REMOVAL  12/12/2011   Procedure: REMOVAL PORT-A-CATH;  Surgeon: Morene ONEIDA Olives, MD;  Location: [facility] ORS;  Service: General;  Laterality: Right;   PORTACATH PLACEMENT  06/12/2011   Procedure: INSERTION PORT-A-CATH;  Surgeon: Morene ONEIDA Olives, MD;  Location:  SURGERY CENTER;  Service: General;  Laterality: Right;  Right Subclavian Vein   SINUS EXPLORATION     remote   VIDEO BRONCHOSCOPY WITH ENDOBRONCHIAL NAVIGATION Right 01/15/2024   Procedure: VIDEO BRONCHOSCOPY WITH ENDOBRONCHIAL NAVIGATION;  Surgeon: Shelah Lamar RAMAN, MD;  Location: [facility] ENDOSCOPY;  Service: Pulmonary;  Laterality: Right;    SOCIAL HISTORY: Social History   Socioeconomic History   Marital status: Married    Spouse name: Not on file   Number of children: Not on file   Years of education: Not on file   Highest education level: Not on file  Occupational  History   Occupation: Print production planner  Tobacco Use   Smoking status: Never    Passive exposure: Never   Smokeless tobacco: Never  Vaping Use   Vaping status: Never Used  Substance and Sexual Activity   Alcohol use: Yes    Alcohol/week: 2.0 - 3.0 standard drinks of alcohol    Types: 2 - 3 Glasses of wine per week   Drug use: No   Sexual activity: Yes    Birth control/protection: Post-menopausal  Other Topics Concern   Not on file  Social History Narrative   3 children   Social Drivers of Health   Financial Resource Strain: Not on file  Food Insecurity: No Food Insecurity (12/20/2023)   Hunger Vital Sign    Worried About Running Out of Food in the Last Year: Never true    Ran Out of Food in the Last Year: Never true  Transportation Needs: No Transportation Needs (12/20/2023)   PRAPARE - Administrator, Civil Service (Medical): No    Lack of Transportation (Non-Medical): No  Physical Activity: Not on file  Stress: Not on file  Social Connections: Not on file  Intimate Partner Violence: Not At Risk (12/20/2023)   Humiliation, Afraid, Rape, and Kick questionnaire    Fear of Current or Ex-Partner: No    Emotionally Abused:  No    Physically Abused: No    Sexually Abused: No    FAMILY HISTORY: Family History  Problem Relation Age of Onset   Bladder Cancer Father 46   Diabetes Father    Prostate cancer Paternal Grandfather     ALLERGIES:  has no known allergies.  MEDICATIONS:  Current Outpatient Medications  Medication Sig Dispense Refill   abemaciclib  (VERZENIO ) 150 MG tablet Take 1 tablet (150 mg total) by mouth 2 (two) times daily. 60 tablet 3   b complex vitamins tablet Take 1 tablet by mouth daily.      Cholecalciferol (VITAMIN D3) 25 MCG (1000 UT) CAPS 1 capsule.     clobetasol cream (TEMOVATE) 0.05 % Apply 1 Application topically.     letrozole  (FEMARA ) 2.5 MG tablet Take 1 tablet (2.5 mg total) by mouth daily. 90 tablet 3   melatonin 5 MG TABS Take 5  mg by mouth.     mesalamine (LIALDA) 1.2 G EC tablet Take 1,200 mg by mouth daily with breakfast.     Multiple Vitamin (MULITIVITAMIN WITH MINERALS) TABS Take 1 tablet by mouth daily.     nitroGLYCERIN  (NITROSTAT ) 0.4 MG SL tablet Place 1 tablet (0.4 mg total) under the tongue every 5 (five) minutes as needed for chest pain. 25 tablet 3   ondansetron  (ZOFRAN ) 8 MG tablet Take 1 tablet (8 mg total) by mouth every 8 (eight) hours as needed for nausea or vomiting. 30 tablet 2   SUMAtriptan (IMITREX) 50 MG tablet Take 50 mg by mouth every 2 (two) hours as needed for migraine. May repeat in 2 hours if headache persists or recurs.     venlafaxine  XR (EFFEXOR -XR) 37.5 MG 24 hr capsule TAKE 2 CAPSULES (75 MG TOTAL) BY MOUTH DAILY. 180 capsule 2   No current facility-administered medications for this visit.    REVIEW OF SYSTEMS:   Constitutional: Denies fevers, chills or abnormal night sweats Eyes: Denies blurriness of vision, double vision or watery eyes Ears, nose, mouth, throat, and face: Denies mucositis or sore throat Respiratory: Denies cough, dyspnea or wheezes Cardiovascular: Denies palpitation, chest discomfort or lower extremity swelling Gastrointestinal:  Denies nausea, heartburn or change in bowel habits Skin: Denies abnormal skin rashes Lymphatics: Denies new lymphadenopathy or easy bruising Neurological:Denies numbness, tingling or new weaknesses Behavioral/Psych: Mood is stable, no new changes  Breast: Denies any palpable lumps or discharge All other systems were reviewed with the patient and are negative.  PHYSICAL EXAMINATION: ECOG PERFORMANCE STATUS: 0 - Asymptomatic  Vitals:   02/18/24 1110  BP: 114/79  Pulse: 82  Resp: 17  Temp: 98.2 F (36.8 C)  SpO2: 100%      Filed Weights   02/18/24 1110  Weight: 132 lb 12.8 oz (60.2 kg)     GENERAL:alert, no distress and comfortable No palpable cervical or supraclavicular adenopathy Chest : CTA bilaterally RRR Abd:  soft, non distended,  No LE edema.   LABORATORY DATA:  I have reviewed the data as listed Lab Results  Component Value Date   WBC 4.4 02/18/2024   HGB 12.9 02/18/2024   HCT 38.6 02/18/2024   MCV 84.3 02/18/2024   PLT 211 02/18/2024   Lab Results  Component Value Date   NA 141 01/15/2024   K 4.6 01/15/2024   CL 106 01/15/2024   CO2 26 01/15/2024    RADIOGRAPHIC STUDIES: I have personally reviewed the radiological reports and agreed with the findings in the report.  ASSESSMENT AND PLAN:   58  y.o. BRCA negative [place] woman    (1) Status post left lumpectomy and sentinel lymph node biopsy October of 2012 for a T2 N1, stage IIB invasive ductal carcinoma, grade 2,  strongly estrogen and progesterone receptor positive, with an MIB-1 of 9% and no HER-2/neu amplification.    (2) Treated initially with docetaxel , doxorubicin , and cyclophosphamide  for one cycle, with very poor tolerance   (3) status post cyclophosphamide / docetaxel , for an additional three cycles completed January 2013   (4)  Status post radiation, completed mid-April 2013   (5)  on tamoxifen  as of April 2013-- switched to anastrozole  March 2018, to be discontinued February 2020             (a) bone density January 2019 shows a T score of -1.7  (6) in May 2025 she had a mammogram and an ultrasound which showed a 2 x 1.7 x 2 cm irregular mass highly suggestive of malignancy.  This showed invasive poorly differentiated adenocarcinoma grade 3, ER 100% positive strong staining PR 60% positive strong staining, Ki-67 of 30% group 5 HER2 negative  (7) we did staging scans given concern for local recurrence which showed right lower lobe lung nodule highly suspicious for metastatic disease versus a second primary.  Hence we proceeded with the PET/CT which showed multiple sites concerning for metastatic disease including mediastinal adenopathy, lung, bone disease.  (8)Bronchoscopy confirmed met breast cancer, ER pos, PR  pos, Her 2 1+   Assessment and Plan Assessment & Plan Breast cancer, ER positive, HER2 negative ER positive, HER2 negative breast cancer confirmed. Strongly estrogen receptor positive, weakly progesterone receptor positive, HER2 negative (1+). - Start letrozole  and Verzenio  (abemaciclib ) as frontline therapy. - Started verzenio  on July 14 th 2025, letrozole  July 1st 2025, . Mild side effects noted. - Continue current Verzenio  dose until new prescription received. - Draw tumor markers today.  Adverse effects of cancer treatment Mild upset stomach and loose stools from Verzenio , joint tightening from letrozole . Side effects manageable.  Metastatic cancer to bone. Dental clearance obtained for bone strengthener initiation. - Initiate bone strengthener treatment tomorrow.  Time spent: 30 min  All questions were answered. The patient knows to call the clinic with any problems, questions or concerns.    Amber Stalls, MD 02/18/24

## 2024-02-19 ENCOUNTER — Inpatient Hospital Stay

## 2024-02-19 VITALS — BP 128/87 | HR 75 | Temp 98.0°F | Resp 16

## 2024-02-19 DIAGNOSIS — C50412 Malignant neoplasm of upper-outer quadrant of left female breast: Secondary | ICD-10-CM | POA: Diagnosis not present

## 2024-02-19 LAB — CANCER ANTIGEN 15-3: CA 15-3: 55.5 U/mL — ABNORMAL HIGH (ref 0.0–25.0)

## 2024-02-19 LAB — CANCER ANTIGEN 27.29: CA 27.29: 68 U/mL — ABNORMAL HIGH (ref 0.0–38.6)

## 2024-02-19 MED ORDER — ZOLEDRONIC ACID 4 MG/100ML IV SOLN
4.0000 mg | Freq: Once | INTRAVENOUS | Status: AC
  Administered 2024-02-19: 4 mg via INTRAVENOUS
  Filled 2024-02-19: qty 100

## 2024-02-19 MED ORDER — SODIUM CHLORIDE 0.9 % IV SOLN
INTRAVENOUS | Status: DC
Start: 2024-02-19 — End: 2024-02-19

## 2024-02-19 NOTE — Patient Instructions (Signed)

## 2024-02-28 ENCOUNTER — Ambulatory Visit: Admitting: Hematology and Oncology

## 2024-03-05 ENCOUNTER — Encounter: Payer: Self-pay | Admitting: Hematology and Oncology

## 2024-03-17 ENCOUNTER — Inpatient Hospital Stay: Attending: Hematology and Oncology | Admitting: Hematology and Oncology

## 2024-03-17 ENCOUNTER — Inpatient Hospital Stay

## 2024-03-17 VITALS — BP 120/70 | HR 85 | Temp 97.7°F | Resp 18 | Wt 130.4 lb

## 2024-03-17 DIAGNOSIS — M8580 Other specified disorders of bone density and structure, unspecified site: Secondary | ICD-10-CM | POA: Insufficient documentation

## 2024-03-17 DIAGNOSIS — R072 Precordial pain: Secondary | ICD-10-CM | POA: Diagnosis not present

## 2024-03-17 DIAGNOSIS — Z79811 Long term (current) use of aromatase inhibitors: Secondary | ICD-10-CM | POA: Diagnosis not present

## 2024-03-17 DIAGNOSIS — G893 Neoplasm related pain (acute) (chronic): Secondary | ICD-10-CM | POA: Insufficient documentation

## 2024-03-17 DIAGNOSIS — Z79810 Long term (current) use of selective estrogen receptor modulators (SERMs): Secondary | ICD-10-CM | POA: Diagnosis not present

## 2024-03-17 DIAGNOSIS — Z1732 Human epidermal growth factor receptor 2 negative status: Secondary | ICD-10-CM | POA: Diagnosis not present

## 2024-03-17 DIAGNOSIS — R519 Headache, unspecified: Secondary | ICD-10-CM | POA: Diagnosis not present

## 2024-03-17 DIAGNOSIS — Z79899 Other long term (current) drug therapy: Secondary | ICD-10-CM | POA: Diagnosis not present

## 2024-03-17 DIAGNOSIS — Z923 Personal history of irradiation: Secondary | ICD-10-CM | POA: Diagnosis not present

## 2024-03-17 DIAGNOSIS — M549 Dorsalgia, unspecified: Secondary | ICD-10-CM | POA: Insufficient documentation

## 2024-03-17 DIAGNOSIS — Z8052 Family history of malignant neoplasm of bladder: Secondary | ICD-10-CM | POA: Diagnosis not present

## 2024-03-17 DIAGNOSIS — Z1721 Progesterone receptor positive status: Secondary | ICD-10-CM | POA: Diagnosis not present

## 2024-03-17 DIAGNOSIS — Z8042 Family history of malignant neoplasm of prostate: Secondary | ICD-10-CM | POA: Insufficient documentation

## 2024-03-17 DIAGNOSIS — R5383 Other fatigue: Secondary | ICD-10-CM | POA: Diagnosis not present

## 2024-03-17 DIAGNOSIS — R197 Diarrhea, unspecified: Secondary | ICD-10-CM | POA: Insufficient documentation

## 2024-03-17 DIAGNOSIS — M2550 Pain in unspecified joint: Secondary | ICD-10-CM | POA: Insufficient documentation

## 2024-03-17 DIAGNOSIS — C50412 Malignant neoplasm of upper-outer quadrant of left female breast: Secondary | ICD-10-CM | POA: Diagnosis not present

## 2024-03-17 DIAGNOSIS — Z170 Estrogen receptor positive status [ER+]: Secondary | ICD-10-CM | POA: Diagnosis not present

## 2024-03-17 DIAGNOSIS — Z833 Family history of diabetes mellitus: Secondary | ICD-10-CM | POA: Diagnosis not present

## 2024-03-17 DIAGNOSIS — N6489 Other specified disorders of breast: Secondary | ICD-10-CM | POA: Insufficient documentation

## 2024-03-17 DIAGNOSIS — C50919 Malignant neoplasm of unspecified site of unspecified female breast: Secondary | ICD-10-CM

## 2024-03-17 LAB — CBC WITH DIFFERENTIAL (CANCER CENTER ONLY)
Abs Immature Granulocytes: 0.01 K/uL (ref 0.00–0.07)
Basophils Absolute: 0 K/uL (ref 0.0–0.1)
Basophils Relative: 1 %
Eosinophils Absolute: 0 K/uL (ref 0.0–0.5)
Eosinophils Relative: 1 %
HCT: 39.3 % (ref 36.0–46.0)
Hemoglobin: 13.2 g/dL (ref 12.0–15.0)
Immature Granulocytes: 0 %
Lymphocytes Relative: 38 %
Lymphs Abs: 1.6 K/uL (ref 0.7–4.0)
MCH: 28.4 pg (ref 26.0–34.0)
MCHC: 33.6 g/dL (ref 30.0–36.0)
MCV: 84.7 fL (ref 80.0–100.0)
Monocytes Absolute: 0.3 K/uL (ref 0.1–1.0)
Monocytes Relative: 8 %
Neutro Abs: 2.2 K/uL (ref 1.7–7.7)
Neutrophils Relative %: 52 %
Platelet Count: 283 K/uL (ref 150–400)
RBC: 4.64 MIL/uL (ref 3.87–5.11)
RDW: 17.1 % — ABNORMAL HIGH (ref 11.5–15.5)
WBC Count: 4.2 K/uL (ref 4.0–10.5)
nRBC: 0 % (ref 0.0–0.2)

## 2024-03-17 LAB — CMP (CANCER CENTER ONLY)
ALT: 10 U/L (ref 0–44)
AST: 13 U/L — ABNORMAL LOW (ref 15–41)
Albumin: 4.4 g/dL (ref 3.5–5.0)
Alkaline Phosphatase: 55 U/L (ref 38–126)
Anion gap: 3 — ABNORMAL LOW (ref 5–15)
BUN: 15 mg/dL (ref 6–20)
CO2: 31 mmol/L (ref 22–32)
Calcium: 9.4 mg/dL (ref 8.9–10.3)
Chloride: 106 mmol/L (ref 98–111)
Creatinine: 0.85 mg/dL (ref 0.44–1.00)
GFR, Estimated: >60 mL/min (ref >60)
Glucose, Bld: 87 mg/dL (ref 70–99)
Potassium: 4.2 mmol/L (ref 3.5–5.1)
Sodium: 140 mmol/L (ref 135–145)
Total Bilirubin: 0.3 mg/dL (ref 0.0–1.2)
Total Protein: 6.9 g/dL (ref 6.5–8.1)

## 2024-03-17 MED ORDER — TRAMADOL HCL 50 MG PO TABS: 50.0000 mg | ORAL_TABLET | Freq: Two times a day (BID) | ORAL | 0 refills | Status: DC | PRN

## 2024-03-17 NOTE — Progress Notes (Unsigned)
 [facility] CONSULT NOTE  Patient Care Team: Dyane Anthony RAMAN, FNP as PCP - General (Family Medicine) Jason Charleston, MD (Inactive) as Consulting Physician (Radiation Oncology) Loretha Ash, MD as Consulting Physician (Hematology and Oncology) Glean Stephane BROCKS, RN (Inactive) as Oncology Nurse Navigator Tyree Nanetta SAILOR, RN as Oncology Nurse Navigator Ebbie Cough, MD as Consulting Physician (General Surgery) Dewey Rush, MD as Consulting Physician (Radiation Oncology)  CHIEF COMPLAINTS/PURPOSE OF CONSULTATION:  Newly diagnosed breast cancer  HISTORY OF PRESENTING ILLNESS:  Erin Castillo 58 y.o. female is here because of recent diagnosis of left breast cancer  I reviewed her records extensively and collaborated the history with the patient.  SUMMARY OF ONCOLOGIC HISTORY: Oncology History  Malignant neoplasm of upper-outer quadrant of left breast in female, estrogen receptor positive (HCC)  04/26/2011 Initial Diagnosis   Malignant neoplasm of upper-outer quadrant of left breast in female, estrogen receptor positive (HCC)   11/30/2023 Mammogram   Focal asymmetry with calcifications in the left breast is indeterminate. Additional views with US  is recommended. US  in the breast   12/11/2023 Pathology Results   Left breast needle core biopsy showed invasive poorly differentiated adenocarcinoma grade 3 prognostic showed ER 100% positive strong staining PR 60% positive strong staining Ki-67 of 30% and group 5 HER2 negative    Genetic Testing   [facility] CancerNext-Expanded Panel+RNA was Negative. Report date is 01/06/2024.   The CancerNext-Expanded gene panel offered by [facility] and includes sequencing, rearrangement, and RNA analysis for the following 77 genes: AIP, ALK, APC, ATM, AXIN2, BAP1, BARD1, BMPR1A, BRCA1, BRCA2, BRIP1, CDC73, CDH1, CDK4, CDKN1B, CDKN2A, CEBPA, CHEK2, CTNNA1, DDX41, DICER1, ETV6, FH, FLCN, GATA2, LZTR1, MAX, MBD4, MEN1, MET, MLH1, MSH2, MSH3,  MSH6, MUTYH, NF1, NF2, NTHL1, PALB2, PHOX2B, PMS2, POT1, PRKAR1A, PTCH1, PTEN, RAD51C, RAD51D, RB1, RET, RPS20, RUNX1, SDHA, SDHAF2, SDHB, SDHC, SDHD, SMAD4, SMARCA4, SMARCB1, SMARCE1, STK11, SUFU, TMEM127, TP53, TSC1, TSC2, VHL, and WT1 (sequencing and deletion/duplication); EGFR, HOXB13, KIT, MITF, PDGFRA, POLD1, and POLE (sequencing only); EPCAM and GREM1 (deletion/duplication only).      Discussed the use of AI scribe software for clinical note transcription with the patient, who gave verbal consent to proceed.  History of Present Illness Erin Castillo is a 58 year old female with breast cancer who presents with fatigue and medication side effects.  She experiences significant fatigue, which has worsened since her Verzenio  dose was increased to 150 mg on March 01, 2024. The fatigue was present before the dose increase but has intensified since then.  She has had diarrhea three times since the dose increase, describing the episodes as 'horrific,' but it is not a daily occurrence. She also reports an increase in headaches, which she attributes to her medication, and mentions that her appetite is not great, leading to nausea and a preference for smaller meals.  She experiences joint pain and morning stiffness. The joint pain is significant enough to impact her morning routine, but she manages her pace due to working remotely. She is concerned about an upcoming trip to [place] from September 7 to 17, 2025, and the potential impact of her symptoms on her ability to enjoy the trip.  She reports back pain described as a burning sensation, which is a new symptom. She manages the pain with Advil as needed. Additionally, she mentions occasional sternum pain when lying down at night.  Her sleep has been variable, with some nights being better than others. She notes that sometimes she experiences pain in her sternum at night.  She is currently taking Verzenio  and letrozole , with the recent increase in  Verzenio  to 150 mg. She also takes Advil for pain management as needed.  She works remotely, which allows her to manage her symptoms at her own pace.   Rest of the pertinent 10 point ROS reviewed and negative  MEDICAL HISTORY:  Past Medical History:  Diagnosis Date   Achilles tendonitis    Anxiety    Breast cancer (HCC) 04/20/2011   Left, ER/PR+   Chest pain    hx - no current problem   Depression    Dyspareunia in female    GAD (generalized anxiety disorder)    Heel spur, left    History of radiation therapy    09/18/11 to 11/03/11, left breast   Hypercholesterolemia    Hypotension    Insomnia    occasional   Migraines    Osteopenia    Postmenopausal atrophic vaginitis    Pulmonary nodule 12/2023   right lower lobe   Ulcerative colitis (HCC)     SURGICAL HISTORY: Past Surgical History:  Procedure Laterality Date   BREAST LUMPECTOMY  05/15/11   left breast lumpectomy    CESAREAN SECTION  2000   ENDOBRONCHIAL ULTRASOUND  01/15/2024   Procedure: ENDOBRONCHIAL ULTRASOUND (EBUS);  Surgeon: Shelah Lamar RAMAN, MD;  Location: [facility] ENDOSCOPY;  Service: Pulmonary;;   MASTECTOMY PARTIAL / LUMPECTOMY W/ AXILLARY LYMPHADENECTOMY  10/12   snbx   NASAL SINUS SURGERY  1991   PORT-A-CATH REMOVAL  12/12/2011   Procedure: REMOVAL PORT-A-CATH;  Surgeon: Morene ONEIDA Olives, MD;  Location: [facility] ORS;  Service: General;  Laterality: Right;   PORTACATH PLACEMENT  06/12/2011   Procedure: INSERTION PORT-A-CATH;  Surgeon: Morene ONEIDA Olives, MD;  Location: [facility];  Service: General;  Laterality: Right;  Right Subclavian Vein   SINUS EXPLORATION     remote   VIDEO BRONCHOSCOPY WITH ENDOBRONCHIAL NAVIGATION Right 01/15/2024   Procedure: VIDEO BRONCHOSCOPY WITH ENDOBRONCHIAL NAVIGATION;  Surgeon: Shelah Lamar RAMAN, MD;  Location: [facility] ENDOSCOPY;  Service: Pulmonary;  Laterality: Right;    SOCIAL HISTORY: Social History   Socioeconomic History   Marital status: Married    Spouse  name: Not on file   Number of children: Not on file   Years of education: Not on file   Highest education level: Not on file  Occupational History   Occupation: Print production planner  Tobacco Use   Smoking status: Never    Passive exposure: Never   Smokeless tobacco: Never  Vaping Use   Vaping status: Never Used  Substance and Sexual Activity   Alcohol use: Yes    Alcohol/week: 2.0 - 3.0 standard drinks of alcohol    Types: 2 - 3 Glasses of wine per week   Drug use: No   Sexual activity: Yes    Birth control/protection: Post-menopausal  Other Topics Concern   Not on file  Social History Narrative   3 children   Social Drivers of Health   Financial Resource Strain: Not on file  Food Insecurity: No Food Insecurity (12/20/2023)   Hunger Vital Sign    Worried About Running Out of Food in the Last Year: Never true    Ran Out of Food in the Last Year: Never true  Transportation Needs: No Transportation Needs (12/20/2023)   PRAPARE - Administrator, Civil Service (Medical): No    Lack of Transportation (Non-Medical): No  Physical Activity: Not on file  Stress: Not on file  Social  Connections: Not on file  Intimate Partner Violence: Not At Risk (12/20/2023)   Humiliation, Afraid, Rape, and Kick questionnaire    Fear of Current or Ex-Partner: No    Emotionally Abused: No    Physically Abused: No    Sexually Abused: No    FAMILY HISTORY: Family History  Problem Relation Age of Onset   Bladder Cancer Father 26   Diabetes Father    Prostate cancer Paternal Grandfather     ALLERGIES:  has no known allergies.  MEDICATIONS:  Current Outpatient Medications  Medication Sig Dispense Refill   abemaciclib  (VERZENIO ) 150 MG tablet Take 1 tablet (150 mg total) by mouth 2 (two) times daily. 56 tablet 3   b complex vitamins tablet Take 1 tablet by mouth daily.      Cholecalciferol (VITAMIN D3) 25 MCG (1000 UT) CAPS 1 capsule.     clobetasol cream (TEMOVATE) 0.05 % Apply 1  Application topically.     letrozole  (FEMARA ) 2.5 MG tablet Take 1 tablet (2.5 mg total) by mouth daily. 90 tablet 3   melatonin 5 MG TABS Take 5 mg by mouth.     mesalamine (LIALDA) 1.2 G EC tablet Take 1,200 mg by mouth daily with breakfast.     Multiple Vitamin (MULITIVITAMIN WITH MINERALS) TABS Take 1 tablet by mouth daily.     nitroGLYCERIN  (NITROSTAT ) 0.4 MG SL tablet Place 1 tablet (0.4 mg total) under the tongue every 5 (five) minutes as needed for chest pain. 25 tablet 3   ondansetron  (ZOFRAN ) 8 MG tablet Take 1 tablet (8 mg total) by mouth every 8 (eight) hours as needed for nausea or vomiting. 30 tablet 2   SUMAtriptan (IMITREX) 50 MG tablet Take 50 mg by mouth every 2 (two) hours as needed for migraine. May repeat in 2 hours if headache persists or recurs.     venlafaxine  XR (EFFEXOR -XR) 37.5 MG 24 hr capsule TAKE 2 CAPSULES (75 MG TOTAL) BY MOUTH DAILY. 180 capsule 2   No current facility-administered medications for this visit.    REVIEW OF SYSTEMS:   Constitutional: Denies fevers, chills or abnormal night sweats Eyes: Denies blurriness of vision, double vision or watery eyes Ears, nose, mouth, throat, and face: Denies mucositis or sore throat Respiratory: Denies cough, dyspnea or wheezes Cardiovascular: Denies palpitation, chest discomfort or lower extremity swelling Gastrointestinal:  Denies nausea, heartburn or change in bowel habits Skin: Denies abnormal skin rashes Lymphatics: Denies new lymphadenopathy or easy bruising Neurological:Denies numbness, tingling or new weaknesses Behavioral/Psych: Mood is stable, no new changes  Breast: Denies any palpable lumps or discharge All other systems were reviewed with the patient and are negative.  PHYSICAL EXAMINATION: ECOG PERFORMANCE STATUS: 0 - Asymptomatic  Vitals:   03/17/24 1246  BP: 120/70  Pulse: 85  Resp: 18  Temp: 97.7 F (36.5 C)  SpO2: 100%      Filed Weights   03/17/24 1246  Weight: 130 lb 6.4 oz  (59.1 kg)     GENERAL:alert, no distress and comfortable No palpable cervical or supraclavicular adenopathy Chest : CTA bilaterally RRR Abd: soft, non distended,  No LE edema.   LABORATORY DATA:  I have reviewed the data as listed Lab Results  Component Value Date   WBC 4.2 03/17/2024   HGB 13.2 03/17/2024   HCT 39.3 03/17/2024   MCV 84.7 03/17/2024   PLT 283 03/17/2024   Lab Results  Component Value Date   NA 140 03/17/2024   K 4.2 03/17/2024   CL  106 03/17/2024   CO2 31 03/17/2024    RADIOGRAPHIC STUDIES: I have personally reviewed the radiological reports and agreed with the findings in the report.  ASSESSMENT AND PLAN:   58 y.o. BRCA negative [place] woman    (1) Status post left lumpectomy and sentinel lymph node biopsy October of 2012 for a T2 N1, stage IIB invasive ductal carcinoma, grade 2,  strongly estrogen and progesterone receptor positive, with an MIB-1 of 9% and no HER-2/neu amplification.    (2) Treated initially with docetaxel , doxorubicin , and cyclophosphamide  for one cycle, with very poor tolerance   (3) status post cyclophosphamide / docetaxel , for an additional three cycles completed January 2013   (4)  Status post radiation, completed mid-April 2013   (5)  on tamoxifen  as of April 2013-- switched to anastrozole  March 2018, to be discontinued February 2020             (a) bone density January 2019 shows a T score of -1.7  (6) in May 2025 she had a mammogram and an ultrasound which showed a 2 x 1.7 x 2 cm irregular mass highly suggestive of malignancy.  This showed invasive poorly differentiated adenocarcinoma grade 3, ER 100% positive strong staining PR 60% positive strong staining, Ki-67 of 30% group 5 HER2 negative  (7) we did staging scans given concern for local recurrence which showed right lower lobe lung nodule highly suspicious for metastatic disease versus a second primary.  Hence we proceeded with the PET/CT which showed multiple sites  concerning for metastatic disease including mediastinal adenopathy, lung, bone disease.  (8)Bronchoscopy confirmed met breast cancer, ER pos, PR pos, Her 2 1+   Assessment and Plan Assessment & Plan  Metastatic breast cancer with bone metastases Increased back pain likely due to bone metastases. Blood counts stable, tumor markers slightly increased.  We initially talked about stopping verzenio  during her [place] trip but I later sent a message to our nursing team to see if we can instead do verzenio  every other day during her [place] trip given the up tick in tumor markers. - Continue letrozole  therapy. - Consider radiation therapy for back pain post-[place]. - Schedule scan in early October to assess disease status.  Adverse effects of antineoplastic therapy (fatigue, diarrhea, headache, nausea, decreased appetite, joint pain, morning stiffness) Fatigue, diarrhea, headaches, nausea, decreased appetite, joint pain, and morning stiffness likely due to antineoplastic therapy. Fatigue and headaches worsened with Verzenio  150 mg. Diarrhea noted since August 9. Joint pain and stiffness attributed to letrozole . Discussed potential Verzenio  dose reduction to 100 mg for tolerability. - Reduce Verzenio  to 100 mg if side effects persist. - Provide tramadol  for pain management, to be tried before [place] trip. - Continue letrozole  therapy.  Neoplasm-related pain (breast, back, sternum) Neoplasm-related pain in breast, back, and sternum. Back pain burning and persistent. Occasional sternum pain possibly Verzenio -related. - Provide tramadol  for pain management, to be tried before [place] trip. - Consider radiation therapy for back pain post-[place].  Bone health management in metastatic cancer (Zemaira infusion) Bone health management with Zemaira infusion scheduled. No dental issues reported. - Administer Zometa  infusion on October 20.  Time spent: 40 min  All questions were answered. The patient knows to  call the clinic with any problems, questions or concerns.    Amber Stalls, MD 03/17/24

## 2024-03-18 LAB — CANCER ANTIGEN 27.29: CA 27.29: 82.8 U/mL — ABNORMAL HIGH (ref 0.0–38.6)

## 2024-03-18 LAB — CANCER ANTIGEN 15-3: CA 15-3: 72.4 U/mL — ABNORMAL HIGH (ref 0.0–25.0)

## 2024-03-19 ENCOUNTER — Encounter: Payer: Self-pay | Admitting: Hematology and Oncology

## 2024-04-15 ENCOUNTER — Inpatient Hospital Stay (HOSPITAL_BASED_OUTPATIENT_CLINIC_OR_DEPARTMENT_OTHER): Admitting: Hematology and Oncology

## 2024-04-15 ENCOUNTER — Inpatient Hospital Stay: Attending: Hematology and Oncology

## 2024-04-15 VITALS — BP 116/71 | HR 75 | Temp 97.9°F | Resp 18 | Ht 64.0 in | Wt 135.0 lb

## 2024-04-15 DIAGNOSIS — Z923 Personal history of irradiation: Secondary | ICD-10-CM | POA: Insufficient documentation

## 2024-04-15 DIAGNOSIS — N6489 Other specified disorders of breast: Secondary | ICD-10-CM | POA: Diagnosis not present

## 2024-04-15 DIAGNOSIS — Z8042 Family history of malignant neoplasm of prostate: Secondary | ICD-10-CM | POA: Insufficient documentation

## 2024-04-15 DIAGNOSIS — Z79899 Other long term (current) drug therapy: Secondary | ICD-10-CM | POA: Diagnosis not present

## 2024-04-15 DIAGNOSIS — R519 Headache, unspecified: Secondary | ICD-10-CM | POA: Diagnosis not present

## 2024-04-15 DIAGNOSIS — C7951 Secondary malignant neoplasm of bone: Secondary | ICD-10-CM | POA: Diagnosis not present

## 2024-04-15 DIAGNOSIS — T50905A Adverse effect of unspecified drugs, medicaments and biological substances, initial encounter: Secondary | ICD-10-CM | POA: Diagnosis not present

## 2024-04-15 DIAGNOSIS — Z833 Family history of diabetes mellitus: Secondary | ICD-10-CM | POA: Diagnosis not present

## 2024-04-15 DIAGNOSIS — Z8052 Family history of malignant neoplasm of bladder: Secondary | ICD-10-CM | POA: Diagnosis not present

## 2024-04-15 DIAGNOSIS — C50412 Malignant neoplasm of upper-outer quadrant of left female breast: Secondary | ICD-10-CM | POA: Insufficient documentation

## 2024-04-15 DIAGNOSIS — C50919 Malignant neoplasm of unspecified site of unspecified female breast: Secondary | ICD-10-CM

## 2024-04-15 DIAGNOSIS — Z170 Estrogen receptor positive status [ER+]: Secondary | ICD-10-CM | POA: Insufficient documentation

## 2024-04-15 DIAGNOSIS — M2550 Pain in unspecified joint: Secondary | ICD-10-CM | POA: Diagnosis not present

## 2024-04-15 DIAGNOSIS — C7800 Secondary malignant neoplasm of unspecified lung: Secondary | ICD-10-CM | POA: Diagnosis not present

## 2024-04-15 DIAGNOSIS — Z79810 Long term (current) use of selective estrogen receptor modulators (SERMs): Secondary | ICD-10-CM | POA: Diagnosis not present

## 2024-04-15 DIAGNOSIS — Z79811 Long term (current) use of aromatase inhibitors: Secondary | ICD-10-CM | POA: Diagnosis not present

## 2024-04-15 DIAGNOSIS — R072 Precordial pain: Secondary | ICD-10-CM | POA: Diagnosis not present

## 2024-04-15 DIAGNOSIS — G893 Neoplasm related pain (acute) (chronic): Secondary | ICD-10-CM | POA: Diagnosis not present

## 2024-04-15 DIAGNOSIS — Z1721 Progesterone receptor positive status: Secondary | ICD-10-CM | POA: Diagnosis not present

## 2024-04-15 DIAGNOSIS — Z1732 Human epidermal growth factor receptor 2 negative status: Secondary | ICD-10-CM | POA: Diagnosis not present

## 2024-04-15 DIAGNOSIS — Z9010 Acquired absence of unspecified breast and nipple: Secondary | ICD-10-CM | POA: Diagnosis not present

## 2024-04-15 DIAGNOSIS — R5383 Other fatigue: Secondary | ICD-10-CM | POA: Diagnosis not present

## 2024-04-15 DIAGNOSIS — N644 Mastodynia: Secondary | ICD-10-CM | POA: Insufficient documentation

## 2024-04-15 LAB — CBC WITH DIFFERENTIAL (CANCER CENTER ONLY)
Abs Immature Granulocytes: 0.01 K/uL (ref 0.00–0.07)
Basophils Absolute: 0.1 K/uL (ref 0.0–0.1)
Basophils Relative: 2 %
Eosinophils Absolute: 0.1 K/uL (ref 0.0–0.5)
Eosinophils Relative: 2 %
HCT: 39 % (ref 36.0–46.0)
Hemoglobin: 13.2 g/dL (ref 12.0–15.0)
Immature Granulocytes: 0 %
Lymphocytes Relative: 26 %
Lymphs Abs: 1.1 K/uL (ref 0.7–4.0)
MCH: 29.9 pg (ref 26.0–34.0)
MCHC: 33.8 g/dL (ref 30.0–36.0)
MCV: 88.4 fL (ref 80.0–100.0)
Monocytes Absolute: 0.4 K/uL (ref 0.1–1.0)
Monocytes Relative: 10 %
Neutro Abs: 2.4 K/uL (ref 1.7–7.7)
Neutrophils Relative %: 60 %
Platelet Count: 356 K/uL (ref 150–400)
RBC: 4.41 MIL/uL (ref 3.87–5.11)
RDW: 19.6 % — ABNORMAL HIGH (ref 11.5–15.5)
WBC Count: 4.1 K/uL (ref 4.0–10.5)
nRBC: 0 % (ref 0.0–0.2)

## 2024-04-15 LAB — CMP (CANCER CENTER ONLY)
ALT: 17 U/L (ref 0–44)
AST: 18 U/L (ref 15–41)
Albumin: 4.5 g/dL (ref 3.5–5.0)
Alkaline Phosphatase: 43 U/L (ref 38–126)
Anion gap: 4 — ABNORMAL LOW (ref 5–15)
BUN: 19 mg/dL (ref 6–20)
CO2: 30 mmol/L (ref 22–32)
Calcium: 9.7 mg/dL (ref 8.9–10.3)
Chloride: 105 mmol/L (ref 98–111)
Creatinine: 0.92 mg/dL (ref 0.44–1.00)
GFR, Estimated: >60 mL/min (ref >60)
Glucose, Bld: 98 mg/dL (ref 70–99)
Potassium: 4.4 mmol/L (ref 3.5–5.1)
Sodium: 139 mmol/L (ref 135–145)
Total Bilirubin: 0.4 mg/dL (ref 0.0–1.2)
Total Protein: 7.3 g/dL (ref 6.5–8.1)

## 2024-04-15 NOTE — Progress Notes (Unsigned)
 [facility] CONSULT NOTE  Patient Care Team: Dyane Anthony RAMAN, FNP as PCP - General (Family Medicine) Jason Charleston, MD (Inactive) as Consulting Physician (Radiation Oncology) Loretha Ash, MD as Consulting Physician (Hematology and Oncology) Tyree Nanetta SAILOR, RN as Oncology Nurse Navigator Ebbie Cough, MD as Consulting Physician (General Surgery) Dewey Rush, MD as Consulting Physician (Radiation Oncology)  CHIEF COMPLAINTS/PURPOSE OF CONSULTATION:  Newly diagnosed breast cancer  HISTORY OF PRESENTING ILLNESS:  Erin Castillo 58 y.o. female is here because of recent diagnosis of left breast cancer  I reviewed her records extensively and collaborated the history with the patient.  SUMMARY OF ONCOLOGIC HISTORY: Oncology History  Malignant neoplasm of upper-outer quadrant of left breast in female, estrogen receptor positive (HCC)  04/26/2011 Initial Diagnosis   Malignant neoplasm of upper-outer quadrant of left breast in female, estrogen receptor positive (HCC)   11/30/2023 Mammogram   Focal asymmetry with calcifications in the left breast is indeterminate. Additional views with US  is recommended. US  in the breast   12/11/2023 Pathology Results   Left breast needle core biopsy showed invasive poorly differentiated adenocarcinoma grade 3 prognostic showed ER 100% positive strong staining PR 60% positive strong staining Ki-67 of 30% and group 5 HER2 negative    Genetic Testing   [facility] CancerNext-Expanded Panel+RNA was Negative. Report date is 01/06/2024.   The CancerNext-Expanded gene panel offered by [facility] and includes sequencing, rearrangement, and RNA analysis for the following 77 genes: AIP, ALK, APC, ATM, AXIN2, BAP1, BARD1, BMPR1A, BRCA1, BRCA2, BRIP1, CDC73, CDH1, CDK4, CDKN1B, CDKN2A, CEBPA, CHEK2, CTNNA1, DDX41, DICER1, ETV6, FH, FLCN, GATA2, LZTR1, MAX, MBD4, MEN1, MET, MLH1, MSH2, MSH3, MSH6, MUTYH, NF1, NF2, NTHL1, PALB2, PHOX2B, PMS2, POT1,  PRKAR1A, PTCH1, PTEN, RAD51C, RAD51D, RB1, RET, RPS20, RUNX1, SDHA, SDHAF2, SDHB, SDHC, SDHD, SMAD4, SMARCA4, SMARCB1, SMARCE1, STK11, SUFU, TMEM127, TP53, TSC1, TSC2, VHL, and WT1 (sequencing and deletion/duplication); EGFR, HOXB13, KIT, MITF, PDGFRA, POLD1, and POLE (sequencing only); EPCAM and GREM1 (deletion/duplication only).      Discussed the use of AI scribe software for clinical note transcription with the patient, who gave verbal consent to proceed.  History of Present Illness Erin Castillo is a 58 year old female with breast cancer who presents with fatigue and medication side effects.  She experiences significant fatigue, which has worsened since her Verzenio  dose was increased to 150 mg on March 01, 2024. The fatigue was present before the dose increase but has intensified since then.  She has had diarrhea three times since the dose increase, describing the episodes as 'horrific,' but it is not a daily occurrence. She also reports an increase in headaches, which she attributes to her medication, and mentions that her appetite is not great, leading to nausea and a preference for smaller meals.  She experiences joint pain and morning stiffness. The joint pain is significant enough to impact her morning routine, but she manages her pace due to working remotely. She is concerned about an upcoming trip to [place] from September 7 to 17, 2025, and the potential impact of her symptoms on her ability to enjoy the trip.  She reports back pain described as a burning sensation, which is a new symptom. She manages the pain with Advil as needed. Additionally, she mentions occasional sternum pain when lying down at night.  Her sleep has been variable, with some nights being better than others. She notes that sometimes she experiences pain in her sternum at night.  She is currently taking Verzenio  and letrozole , with  the recent increase in Verzenio  to 150 mg. She also takes Advil for pain management as  needed.  She works remotely, which allows her to manage her symptoms at her own pace.   Rest of the pertinent 10 point ROS reviewed and negative  MEDICAL HISTORY:  Past Medical History:  Diagnosis Date   Achilles tendonitis    Anxiety    Breast cancer (HCC) 04/20/2011   Left, ER/PR+   Chest pain    hx - no current problem   Depression    Dyspareunia in female    GAD (generalized anxiety disorder)    Heel spur, left    History of radiation therapy    09/18/11 to 11/03/11, left breast   Hypercholesterolemia    Hypotension    Insomnia    occasional   Migraines    Osteopenia    Postmenopausal atrophic vaginitis    Pulmonary nodule 12/2023   right lower lobe   Ulcerative colitis (HCC)     SURGICAL HISTORY: Past Surgical History:  Procedure Laterality Date   BREAST LUMPECTOMY  05/15/11   left breast lumpectomy    CESAREAN SECTION  2000   ENDOBRONCHIAL ULTRASOUND  01/15/2024   Procedure: ENDOBRONCHIAL ULTRASOUND (EBUS);  Surgeon: Shelah Lamar RAMAN, MD;  Location: [facility] ENDOSCOPY;  Service: Pulmonary;;   MASTECTOMY PARTIAL / LUMPECTOMY W/ AXILLARY LYMPHADENECTOMY  10/12   snbx   NASAL SINUS SURGERY  1991   PORT-A-CATH REMOVAL  12/12/2011   Procedure: REMOVAL PORT-A-CATH;  Surgeon: Morene ONEIDA Olives, MD;  Location: [facility] ORS;  Service: General;  Laterality: Right;   PORTACATH PLACEMENT  06/12/2011   Procedure: INSERTION PORT-A-CATH;  Surgeon: Morene ONEIDA Olives, MD;  Location: [facility];  Service: General;  Laterality: Right;  Right Subclavian Vein   SINUS EXPLORATION     remote   VIDEO BRONCHOSCOPY WITH ENDOBRONCHIAL NAVIGATION Right 01/15/2024   Procedure: VIDEO BRONCHOSCOPY WITH ENDOBRONCHIAL NAVIGATION;  Surgeon: Shelah Lamar RAMAN, MD;  Location: [facility]COPY;  Service: Pulmonary;  Laterality: Right;    SOCIAL HISTORY: Social History   Socioeconomic History   Marital status: Married    Spouse name: Not on file   Number of children: Not on file   Years of  education: Not on file   Highest education level: Not on file  Occupational History   Occupation: Print production planner  Tobacco Use   Smoking status: Never    Passive exposure: Never   Smokeless tobacco: Never  Vaping Use   Vaping status: Never Used  Substance and Sexual Activity   Alcohol use: Yes    Alcohol/week: 2.0 - 3.0 standard drinks of alcohol    Types: 2 - 3 Glasses of wine per week   Drug use: No   Sexual activity: Yes    Birth control/protection: Post-menopausal  Other Topics Concern   Not on file  Social History Narrative   3 children   Social Drivers of Health   Financial Resource Strain: Not on file  Food Insecurity: No Food Insecurity (12/20/2023)   Hunger Vital Sign    Worried About Running Out of Food in the Last Year: Never true    Ran Out of Food in the Last Year: Never true  Transportation Needs: No Transportation Needs (12/20/2023)   PRAPARE - Administrator, Civil Service (Medical): No    Lack of Transportation (Non-Medical): No  Physical Activity: Not on file  Stress: Not on file  Social Connections: Not on file  Intimate Partner Violence:  Not At Risk (12/20/2023)   Humiliation, Afraid, Rape, and Kick questionnaire    Fear of Current or Ex-Partner: No    Emotionally Abused: No    Physically Abused: No    Sexually Abused: No    FAMILY HISTORY: Family History  Problem Relation Age of Onset   Bladder Cancer Father 61   Diabetes Father    Prostate cancer Paternal Grandfather     ALLERGIES:  has no known allergies.  MEDICATIONS:  Current Outpatient Medications  Medication Sig Dispense Refill   abemaciclib  (VERZENIO ) 150 MG tablet Take 1 tablet (150 mg total) by mouth 2 (two) times daily. 56 tablet 3   b complex vitamins tablet Take 1 tablet by mouth daily.      Cholecalciferol (VITAMIN D3) 25 MCG (1000 UT) CAPS 1 capsule.     clobetasol cream (TEMOVATE) 0.05 % Apply 1 Application topically.     letrozole  (FEMARA ) 2.5 MG tablet Take 1  tablet (2.5 mg total) by mouth daily. 90 tablet 3   melatonin 5 MG TABS Take 5 mg by mouth.     mesalamine (LIALDA) 1.2 G EC tablet Take 1,200 mg by mouth daily with breakfast.     Multiple Vitamin (MULITIVITAMIN WITH MINERALS) TABS Take 1 tablet by mouth daily.     nitroGLYCERIN  (NITROSTAT ) 0.4 MG SL tablet Place 1 tablet (0.4 mg total) under the tongue every 5 (five) minutes as needed for chest pain. 25 tablet 3   ondansetron  (ZOFRAN ) 8 MG tablet Take 1 tablet (8 mg total) by mouth every 8 (eight) hours as needed for nausea or vomiting. 30 tablet 2   SUMAtriptan (IMITREX) 50 MG tablet Take 50 mg by mouth every 2 (two) hours as needed for migraine. May repeat in 2 hours if headache persists or recurs.     traMADol  (ULTRAM ) 50 MG tablet Take 1 tablet (50 mg total) by mouth every 12 (twelve) hours as needed. 30 tablet 0   venlafaxine  XR (EFFEXOR -XR) 37.5 MG 24 hr capsule TAKE 2 CAPSULES (75 MG TOTAL) BY MOUTH DAILY. 180 capsule 2   No current facility-administered medications for this visit.    REVIEW OF SYSTEMS:   Constitutional: Denies fevers, chills or abnormal night sweats Eyes: Denies blurriness of vision, double vision or watery eyes Ears, nose, mouth, throat, and face: Denies mucositis or sore throat Respiratory: Denies cough, dyspnea or wheezes Cardiovascular: Denies palpitation, chest discomfort or lower extremity swelling Gastrointestinal:  Denies nausea, heartburn or change in bowel habits Skin: Denies abnormal skin rashes Lymphatics: Denies new lymphadenopathy or easy bruising Neurological:Denies numbness, tingling or new weaknesses Behavioral/Psych: Mood is stable, no new changes  Breast: Denies any palpable lumps or discharge All other systems were reviewed with the patient and are negative.  PHYSICAL EXAMINATION: ECOG PERFORMANCE STATUS: 0 - Asymptomatic  Vitals:   04/15/24 1526  BP: 116/71  Pulse: 75  Resp: 18  Temp: 97.9 F (36.6 C)  SpO2: 100%      Filed  Weights   04/15/24 1526  Weight: 135 lb (61.2 kg)     GENERAL:alert, no distress and comfortable No palpable cervical or supraclavicular adenopathy Chest : CTA bilaterally RRR Abd: soft, non distended,  No LE edema.   LABORATORY DATA:  I have reviewed the data as listed Lab Results  Component Value Date   WBC 4.1 04/15/2024   HGB 13.2 04/15/2024   HCT 39.0 04/15/2024   MCV 88.4 04/15/2024   PLT 356 04/15/2024   Lab Results  Component Value  Date   NA 139 04/15/2024   K 4.4 04/15/2024   CL 105 04/15/2024   CO2 30 04/15/2024    RADIOGRAPHIC STUDIES: I have personally reviewed the radiological reports and agreed with the findings in the report.  ASSESSMENT AND PLAN:   58 y.o. BRCA negative [place] woman    (1) Status post left lumpectomy and sentinel lymph node biopsy October of 2012 for a T2 N1, stage IIB invasive ductal carcinoma, grade 2,  strongly estrogen and progesterone receptor positive, with an MIB-1 of 9% and no HER-2/neu amplification.    (2) Treated initially with docetaxel , doxorubicin , and cyclophosphamide  for one cycle, with very poor tolerance   (3) status post cyclophosphamide / docetaxel , for an additional three cycles completed January 2013   (4)  Status post radiation, completed mid-April 2013   (5)  on tamoxifen  as of April 2013-- switched to anastrozole  March 2018, to be discontinued February 2020             (a) bone density January 2019 shows a T score of -1.7  (6) in May 2025 she had a mammogram and an ultrasound which showed a 2 x 1.7 x 2 cm irregular mass highly suggestive of malignancy.  This showed invasive poorly differentiated adenocarcinoma grade 3, ER 100% positive strong staining PR 60% positive strong staining, Ki-67 of 30% group 5 HER2 negative  (7) we did staging scans given concern for local recurrence which showed right lower lobe lung nodule highly suspicious for metastatic disease versus a second primary.  Hence we proceeded  with the PET/CT which showed multiple sites concerning for metastatic disease including mediastinal adenopathy, lung, bone disease.  (8)Bronchoscopy confirmed met breast cancer, ER pos, PR pos, Her 2 1+   Assessment and Plan Assessment & Plan  Metastatic breast cancer with bone metastases Increased back pain likely due to bone metastases. Blood counts stable, tumor markers slightly increased.  We initially talked about stopping verzenio  during her [place] trip but I later sent a message to our nursing team to see if we can instead do verzenio  every other day during her [place] trip given the up tick in tumor markers. - Continue letrozole  therapy. - Consider radiation therapy for back pain post-[place]. - Schedule scan in early October to assess disease status.  Adverse effects of antineoplastic therapy (fatigue, diarrhea, headache, nausea, decreased appetite, joint pain, morning stiffness) Fatigue, diarrhea, headaches, nausea, decreased appetite, joint pain, and morning stiffness likely due to antineoplastic therapy. Fatigue and headaches worsened with Verzenio  150 mg. Diarrhea noted since August 9. Joint pain and stiffness attributed to letrozole . Discussed potential Verzenio  dose reduction to 100 mg for tolerability. - Reduce Verzenio  to 100 mg if side effects persist. - Provide tramadol  for pain management, to be tried before [place] trip. - Continue letrozole  therapy.  Neoplasm-related pain (breast, back, sternum) Neoplasm-related pain in breast, back, and sternum. Back pain burning and persistent. Occasional sternum pain possibly Verzenio -related. - Provide tramadol  for pain management, to be tried before [place] trip. - Consider radiation therapy for back pain post-[place].  Bone health management in metastatic cancer (Zemaira infusion) Bone health management with Zemaira infusion scheduled. No dental issues reported. - Administer Zometa  infusion on October 20.  Time spent: 40 min  All  questions were answered. The patient knows to call the clinic with any problems, questions or concerns.    Amber Stalls, MD 04/15/24

## 2024-04-15 NOTE — Progress Notes (Unsigned)
 [facility] CONSULT NOTE  Patient Care Team: Erin Anthony RAMAN, FNP as PCP - General (Family Medicine) Erin Charleston, MD (Inactive) as Consulting Physician (Radiation Oncology) Erin Ash, MD as Consulting Physician (Hematology and Oncology) Erin Nanetta SAILOR, RN as Oncology Nurse Navigator Erin Cough, MD as Consulting Physician (General Surgery) Erin Rush, MD as Consulting Physician (Radiation Oncology)  CHIEF COMPLAINTS/PURPOSE OF CONSULTATION:  Newly diagnosed breast cancer  HISTORY OF PRESENTING ILLNESS:  Erin Castillo 58 y.o. female is here because of recent diagnosis of left breast cancer  I reviewed her records extensively and collaborated the history with the patient.  SUMMARY OF ONCOLOGIC HISTORY: Oncology History  Malignant neoplasm of upper-outer quadrant of left breast in female, estrogen receptor positive (HCC)  04/26/2011 Initial Diagnosis   Malignant neoplasm of upper-outer quadrant of left breast in female, estrogen receptor positive (HCC)   11/30/2023 Mammogram   Focal asymmetry with calcifications in the left breast is indeterminate. Additional views with US  is recommended. US  in the breast   12/11/2023 Pathology Results   Left breast needle core biopsy showed invasive poorly differentiated adenocarcinoma grade 3 prognostic showed ER 100% positive strong staining PR 60% positive strong staining Ki-67 of 30% and group 5 HER2 negative    Genetic Testing   [facility] CancerNext-Expanded Panel+RNA was Negative. Report date is 01/06/2024.   The CancerNext-Expanded gene panel offered by [facility] and includes sequencing, rearrangement, and RNA analysis for the following 77 genes: AIP, ALK, APC, ATM, AXIN2, BAP1, BARD1, BMPR1A, BRCA1, BRCA2, BRIP1, CDC73, CDH1, CDK4, CDKN1B, CDKN2A, CEBPA, CHEK2, CTNNA1, DDX41, DICER1, ETV6, FH, FLCN, GATA2, LZTR1, MAX, MBD4, MEN1, MET, MLH1, MSH2, MSH3, MSH6, MUTYH, NF1, NF2, NTHL1, PALB2, PHOX2B, PMS2, POT1,  PRKAR1A, PTCH1, PTEN, RAD51C, RAD51D, RB1, RET, RPS20, RUNX1, SDHA, SDHAF2, SDHB, SDHC, SDHD, SMAD4, SMARCA4, SMARCB1, SMARCE1, STK11, SUFU, TMEM127, TP53, TSC1, TSC2, VHL, and WT1 (sequencing and deletion/duplication); EGFR, HOXB13, KIT, MITF, PDGFRA, POLD1, and POLE (sequencing only); EPCAM and GREM1 (deletion/duplication only).      Discussed the use of AI scribe software for clinical note transcription with the patient, who gave verbal consent to proceed.  History of Present Illness Erin Castillo is a 58 year old female with breast cancer who presents with fatigue and medication side effects.  She experiences significant fatigue, which has worsened since her Verzenio  dose was increased to 150 mg on March 01, 2024. The fatigue was present before the dose increase but has intensified since then.  She has had diarrhea three times since the dose increase, describing the episodes as 'horrific,' but it is not a daily occurrence. She also reports an increase in headaches, which she attributes to her medication, and mentions that her appetite is not great, leading to nausea and a preference for smaller meals.  She experiences joint pain and morning stiffness. The joint pain is significant enough to impact her morning routine, but she manages her pace due to working remotely. She is concerned about an upcoming trip to [place] from September 7 to 17, 2025, and the potential impact of her symptoms on her ability to enjoy the trip.  She reports back pain described as a burning sensation, which is a new symptom. She manages the pain with Advil as needed. Additionally, she mentions occasional sternum pain when lying down at night.  Her sleep has been variable, with some nights being better than others. She notes that sometimes she experiences pain in her sternum at night.  She is currently taking Verzenio  and letrozole , with  the recent increase in Verzenio  to 150 mg. She also takes Advil for pain management as  needed.  She works remotely, which allows her to manage her symptoms at her own pace.   Rest of the pertinent 10 point ROS reviewed and negative  MEDICAL HISTORY:  Past Medical History:  Diagnosis Date   Achilles tendonitis    Anxiety    Breast cancer (HCC) 04/20/2011   Left, ER/PR+   Chest pain    hx - no current problem   Depression    Dyspareunia in female    GAD (generalized anxiety disorder)    Heel spur, left    History of radiation therapy    09/18/11 to 11/03/11, left breast   Hypercholesterolemia    Hypotension    Insomnia    occasional   Migraines    Osteopenia    Postmenopausal atrophic vaginitis    Pulmonary nodule 12/2023   right lower lobe   Ulcerative colitis (HCC)     SURGICAL HISTORY: Past Surgical History:  Procedure Laterality Date   BREAST LUMPECTOMY  05/15/11   left breast lumpectomy    CESAREAN SECTION  2000   ENDOBRONCHIAL ULTRASOUND  01/15/2024   Procedure: ENDOBRONCHIAL ULTRASOUND (EBUS);  Surgeon: Erin Lamar RAMAN, MD;  Location: [facility] ENDOSCOPY;  Service: Pulmonary;;   MASTECTOMY PARTIAL / LUMPECTOMY W/ AXILLARY LYMPHADENECTOMY  10/12   snbx   NASAL SINUS SURGERY  1991   PORT-A-CATH REMOVAL  12/12/2011   Procedure: REMOVAL PORT-A-CATH;  Surgeon: Erin ONEIDA Olives, MD;  Location: [facility];  Service: General;  Laterality: Right;   PORTACATH PLACEMENT  06/12/2011   Procedure: INSERTION PORT-A-CATH;  Surgeon: Erin ONEIDA Olives, MD;  Location: [facility];  Service: General;  Laterality: Right;  Right Subclavian Vein   SINUS EXPLORATION     remote   VIDEO BRONCHOSCOPY WITH ENDOBRONCHIAL NAVIGATION Right 01/15/2024   Procedure: VIDEO BRONCHOSCOPY WITH ENDOBRONCHIAL NAVIGATION;  Surgeon: Erin Lamar RAMAN, MD;  Location: [facility] ENDOSCOPY;  Service: Pulmonary;  Laterality: Right;    SOCIAL HISTORY: Social History   Socioeconomic History   Marital status: Married    Spouse name: Not on file   Number of children: Not on file   Years of  education: Not on file   Highest education level: Not on file  Occupational History   Occupation: Print production planner  Tobacco Use   Smoking status: Never    Passive exposure: Never   Smokeless tobacco: Never  Vaping Use   Vaping status: Never Used  Substance and Sexual Activity   Alcohol use: Yes    Alcohol/week: 2.0 - 3.0 standard drinks of alcohol    Types: 2 - 3 Glasses of wine per week   Drug use: No   Sexual activity: Yes    Birth control/protection: Post-menopausal  Other Topics Concern   Not on file  Social History Narrative   3 children   Social Drivers of Health   Financial Resource Strain: Not on file  Food Insecurity: No Food Insecurity (12/20/2023)   Hunger Vital Sign    Worried About Running Out of Food in the Last Year: Never true    Ran Out of Food in the Last Year: Never true  Transportation Needs: No Transportation Needs (12/20/2023)   PRAPARE - Administrator, Civil Service (Medical): No    Lack of Transportation (Non-Medical): No  Physical Activity: Not on file  Stress: Not on file  Social Connections: Not on file  Intimate Partner Violence:  Not At Risk (12/20/2023)   Humiliation, Afraid, Rape, and Kick questionnaire    Fear of Current or Ex-Partner: No    Emotionally Abused: No    Physically Abused: No    Sexually Abused: No    FAMILY HISTORY: Family History  Problem Relation Age of Onset   Bladder Cancer Father 93   Diabetes Father    Prostate cancer Paternal Grandfather     ALLERGIES:  has no known allergies.  MEDICATIONS:  Current Outpatient Medications  Medication Sig Dispense Refill   abemaciclib  (VERZENIO ) 150 MG tablet Take 1 tablet (150 mg total) by mouth 2 (two) times daily. 56 tablet 3   b complex vitamins tablet Take 1 tablet by mouth daily.      Cholecalciferol (VITAMIN D3) 25 MCG (1000 UT) CAPS 1 capsule.     clobetasol cream (TEMOVATE) 0.05 % Apply 1 Application topically.     letrozole  (FEMARA ) 2.5 MG tablet Take 1  tablet (2.5 mg total) by mouth daily. 90 tablet 3   melatonin 5 MG TABS Take 5 mg by mouth.     mesalamine (LIALDA) 1.2 G EC tablet Take 1,200 mg by mouth daily with breakfast.     Multiple Vitamin (MULITIVITAMIN WITH MINERALS) TABS Take 1 tablet by mouth daily.     nitroGLYCERIN  (NITROSTAT ) 0.4 MG SL tablet Place 1 tablet (0.4 mg total) under the tongue every 5 (five) minutes as needed for chest pain. 25 tablet 3   ondansetron  (ZOFRAN ) 8 MG tablet Take 1 tablet (8 mg total) by mouth every 8 (eight) hours as needed for nausea or vomiting. 30 tablet 2   SUMAtriptan (IMITREX) 50 MG tablet Take 50 mg by mouth every 2 (two) hours as needed for migraine. May repeat in 2 hours if headache persists or recurs.     traMADol  (ULTRAM ) 50 MG tablet Take 1 tablet (50 mg total) by mouth every 12 (twelve) hours as needed. 30 tablet 0   venlafaxine  XR (EFFEXOR -XR) 37.5 MG 24 hr capsule TAKE 2 CAPSULES (75 MG TOTAL) BY MOUTH DAILY. 180 capsule 2   No current facility-administered medications for this visit.    REVIEW OF SYSTEMS:   Constitutional: Denies fevers, chills or abnormal night sweats Eyes: Denies blurriness of vision, double vision or watery eyes Ears, nose, mouth, throat, and face: Denies mucositis or sore throat Respiratory: Denies Castillo, dyspnea or wheezes Cardiovascular: Denies palpitation, chest discomfort or lower extremity swelling Gastrointestinal:  Denies nausea, heartburn or change in bowel habits Skin: Denies abnormal skin rashes Lymphatics: Denies new lymphadenopathy or easy bruising Neurological:Denies numbness, tingling or new weaknesses Behavioral/Psych: Mood is stable, no new changes  Breast: Denies any palpable lumps or discharge All other systems were reviewed with the patient and are negative.  PHYSICAL EXAMINATION: ECOG PERFORMANCE STATUS: 0 - Asymptomatic  Vitals:   04/15/24 1526  BP: 116/71  Pulse: 75  Resp: 18  Temp: 97.9 F (36.6 C)  SpO2: 100%      Filed  Weights   04/15/24 1526  Weight: 135 lb (61.2 kg)     GENERAL:alert, no distress and comfortable No palpable cervical or supraclavicular adenopathy Chest : CTA bilaterally RRR Abd: soft, non distended,  No LE edema.   LABORATORY DATA:  I have reviewed the data as listed Lab Results  Component Value Date   WBC 4.1 04/15/2024   HGB 13.2 04/15/2024   HCT 39.0 04/15/2024   MCV 88.4 04/15/2024   PLT 356 04/15/2024   Lab Results  Component Value  Date   NA 139 04/15/2024   K 4.4 04/15/2024   CL 105 04/15/2024   CO2 30 04/15/2024    RADIOGRAPHIC STUDIES: I have personally reviewed the radiological reports and agreed with the findings in the report.  ASSESSMENT AND PLAN:   58 y.o. BRCA negative [place] woman    (1) Status post left lumpectomy and sentinel lymph node biopsy October of 2012 for a T2 N1, stage IIB invasive ductal carcinoma, grade 2,  strongly estrogen and progesterone receptor positive, with an MIB-1 of 9% and no HER-2/neu amplification.    (2) Treated initially with docetaxel , doxorubicin , and cyclophosphamide  for one cycle, with very poor tolerance   (3) status post cyclophosphamide / docetaxel , for an additional three cycles completed January 2013   (4)  Status post radiation, completed mid-April 2013   (5)  on tamoxifen  as of April 2013-- switched to anastrozole  March 2018, to be discontinued February 2020             (a) bone density January 2019 shows a T score of -1.7  (6) in May 2025 she had a mammogram and an ultrasound which showed a 2 x 1.7 x 2 cm irregular mass highly suggestive of malignancy.  This showed invasive poorly differentiated adenocarcinoma grade 3, ER 100% positive strong staining PR 60% positive strong staining, Ki-67 of 30% group 5 HER2 negative  (7) we did staging scans given concern for local recurrence which showed right lower lobe lung nodule highly suspicious for metastatic disease versus a second primary.  Hence we proceeded  with the PET/CT which showed multiple sites concerning for metastatic disease including mediastinal adenopathy, lung, bone disease.  (8)Bronchoscopy confirmed met breast cancer, ER pos, PR pos, Her 2 1+   Assessment and Plan Assessment & Plan  Metastatic breast cancer with bone metastases Increased back pain likely due to bone metastases. Blood counts stable, tumor markers slightly increased.  We initially talked about stopping verzenio  during her [place] trip but I later sent a message to our nursing team to see if we can instead do verzenio  every other day during her [place] trip given the up tick in tumor markers.    Time spent: 40 min  All questions were answered. The patient knows to call the clinic with any problems, questions or concerns.    Amber Stalls, MD 04/15/24

## 2024-04-16 LAB — CANCER ANTIGEN 27.29: CA 27.29: 94.9 U/mL — ABNORMAL HIGH (ref 0.0–38.6)

## 2024-04-16 LAB — CANCER ANTIGEN 15-3: CA 15-3: 83.5 U/mL — ABNORMAL HIGH (ref 0.0–25.0)

## 2024-04-17 ENCOUNTER — Encounter: Payer: Self-pay | Admitting: Hematology and Oncology

## 2024-04-20 DIAGNOSIS — C7951 Secondary malignant neoplasm of bone: Secondary | ICD-10-CM | POA: Insufficient documentation

## 2024-04-20 NOTE — Progress Notes (Incomplete)
 Radiation Oncology         [phone] ________________________________  Outpatient ReConsultation - Conducted via telephone at patient request.  I spoke with the patient to conduct this visit via telephone. The patient was notified in advance and was offered an in person or telemedicine meeting to allow for face to face communication but instead preferred to proceed with a telephone consult.   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   HISTORY OF PRESENT ILLNESS: Erin Castillo is a 58 y.o. female originally seen in the multidisciplinary breast clinic for a diagnosis of left breast cancer. Of note the patient had a palpable mass in the left breast originally noted in the fall 2012, this measured 2.1 cm by mammogram and 1.8 cm by ultrasound with no evidence of axillary adenopathy, she underwent biopsy showing a grade 2 ER/PR positive invasive ductal carcinoma and underwent lumpectomy with sentinel lymph node biopsy on 05/15/2011 which showed a 2.4 cm invasive ductal carcinoma that was grade 2 with negative margins and 1 sentinel lymph node was positive. She had low risk Oncotype Dx score so she went on to receive radiation as well as antiestrogen therapy which she completed for a total of 7 years.  More recently, she presented with a screening detected asymmetry near the site of her prior lumpectomy.  By mammography this area measured up to 2 cm with associated calcifications and by ultrasound measured 2 cm in the 1 o'clock position of the left breast.  Her axilla was negative for adenopathy. A biopsy of her left breast on 12/11/23 at  the 11 o'clock position showed a grade 3 invasive ductal carcinoma that was ER/PR positive, HER2 negative with a Ki-67 of 30%.    She was counseled on staging bone scan which she had performed on 12/26/2023 and this showed no evidence of skeletal metastases,CT chest abdomen pelvis on 12/27/2023 showed evidence of a 17 mm RLL pulmonary nodule concerning for metastatic disease. She underwent a PET scan on 01/02/24 which showed evidence of hypermetabolic mediastinal lymph nodes including a precarinal and subcarinal lymph node, and right hilar lymph node all with SUVs ranging from 7.9-12.4, the right lower lobe nodule had an SUV of 14.9.  Her left breast lesion had an SUV max of 10.9, and an intensely hypermetabolic lesion in the left aspect of the L5 vertebral body with an SUV of 19.6, posterior right iliac bone with an SUV of 10.9, and subtle hypermetabolic lesion in the right acetabular roof with an SUV of 4.9.  She was started on Verzenio  and letrozole .  She also underwent a bronchoscopy which showed adenocarcinoma consistent with breast primary in the RLL fine-needle aspirate, this was also noted in the 4R station 7 fine-needle aspirates.  Workup of her thyroid  with an ultrasound and fine-needle biopsy showed benign follicular nodule.  She has been in recurring more pain in her back as well as a burning sensation which was new, and for this reason she is contacted to see about palliative radiation to help her pain in the spine.  PREVIOUS RADIATION THERAPY:   09/18/2011-11/03/2011  Left breast 50.4 Gy in 28 sessions, left breast boost 12 Gy in 6 sessions treated with Dr. Jason          PAST MEDICAL HISTORY:  Past Medical History:  Diagnosis Date   Achilles tendonitis    Anxiety    Breast cancer (HCC) 04/20/2011   Left, ER/PR+   Chest pain    hx - no current problem   Depression    Dyspareunia in female    GAD (generalized anxiety disorder)    Heel  spur, left    History of radiation therapy    09/18/11 to 11/03/11, left breast   Hypercholesterolemia    Hypotension    Insomnia    occasional   Migraines    Osteopenia    Postmenopausal atrophic vaginitis    Pulmonary nodule 12/2023   right lower lobe   Ulcerative colitis (HCC)        PAST SURGICAL HISTORY: Past Surgical History:  Procedure Laterality Date   BREAST LUMPECTOMY  05/15/11   left breast lumpectomy    CESAREAN SECTION  2000   ENDOBRONCHIAL ULTRASOUND  01/15/2024   Procedure: ENDOBRONCHIAL ULTRASOUND (EBUS);  Surgeon: Shelah Lamar RAMAN, MD;  Location: [facility] ENDOSCOPY;  Service: Pulmonary;;   MASTECTOMY PARTIAL / LUMPECTOMY W/ AXILLARY LYMPHADENECTOMY  10/12   snbx   NASAL SINUS SURGERY  1991   PORT-A-CATH REMOVAL  12/12/2011   Procedure: REMOVAL PORT-A-CATH;  Surgeon: Morene ONEIDA Olives, MD;  Location: [facility];  Service: General;  Laterality: Right;   PORTACATH PLACEMENT  06/12/2011   Procedure: INSERTION PORT-A-CATH;  Surgeon: Morene ONEIDA Olives, MD;  Location: [facility];  Service: General;  Laterality: Right;  Right Subclavian Vein   SINUS EXPLORATION     remote   VIDEO BRONCHOSCOPY WITH ENDOBRONCHIAL NAVIGATION Right 01/15/2024   Procedure: VIDEO BRONCHOSCOPY WITH ENDOBRONCHIAL NAVIGATION;  Surgeon: Shelah Lamar RAMAN, MD;  Location: [facility] ENDOSCOPY;  Service: Pulmonary;  Laterality: Right;     FAMILY HISTORY:  Family History  Problem Relation Age of Onset   Bladder Cancer Father 39   Diabetes Father    Prostate cancer Paternal Grandfather      SOCIAL HISTORY:  reports that she has never smoked. She has never been exposed to tobacco smoke. She has never used smokeless tobacco. She reports current alcohol use of about 2.0 - 3.0 standard drinks of alcohol per week. She reports that she does not use drugs. The patient is married and lives in [place]. She works for a company that [employer]. She and her husband are planning  to relocate to [place], [place] in July. ***   ALLERGIES: Patient has no known allergies.   MEDICATIONS:  Current Outpatient Medications  Medication Sig Dispense Refill   abemaciclib  (VERZENIO ) 150 MG tablet Take 1 tablet (150 mg total) by mouth 2 (two) times daily. 56 tablet 3   b complex vitamins tablet Take 1 tablet by mouth daily.      Cholecalciferol (VITAMIN D3) 25 MCG (1000 UT) CAPS 1 capsule.     clobetasol cream (TEMOVATE) 0.05 % Apply 1 Application topically.     letrozole  (FEMARA ) 2.5 MG tablet Take 1 tablet (2.5 mg total) by mouth daily. 90 tablet 3   melatonin 5 MG TABS Take 5 mg by mouth.     mesalamine (LIALDA) 1.2 G EC tablet Take 1,200 mg by mouth daily with breakfast.     Multiple Vitamin (MULITIVITAMIN WITH MINERALS) TABS Take 1 tablet by mouth daily.  nitroGLYCERIN  (NITROSTAT ) 0.4 MG SL tablet Place 1 tablet (0.4 mg total) under the tongue every 5 (five) minutes as needed for chest pain. 25 tablet 3   ondansetron  (ZOFRAN ) 8 MG tablet Take 1 tablet (8 mg total) by mouth every 8 (eight) hours as needed for nausea or vomiting. 30 tablet 2   SUMAtriptan (IMITREX) 50 MG tablet Take 50 mg by mouth every 2 (two) hours as needed for migraine. May repeat in 2 hours if headache persists or recurs.     traMADol  (ULTRAM ) 50 MG tablet Take 1 tablet (50 mg total) by mouth every 12 (twelve) hours as needed. 30 tablet 0   venlafaxine  XR (EFFEXOR -XR) 37.5 MG 24 hr capsule TAKE 2 CAPSULES (75 MG TOTAL) BY MOUTH DAILY. 180 capsule 2   No current facility-administered medications for this visit.     REVIEW OF SYSTEMS: On review of systems, the patient reports that ***     PHYSICAL EXAM:  Unable to assess given encounter type.    ECOG = ****  0 - Asymptomatic (Fully active, able to carry on all predisease activities without restriction)  1 - Symptomatic but completely ambulatory (Restricted in physically strenuous activity but ambulatory and able to carry out work of a light  or sedentary nature. For example, light housework, office work)  2 - Symptomatic, <50% in bed during the day (Ambulatory and capable of all self care but unable to carry out any work activities. Up and about more than 50% of waking hours)  3 - Symptomatic, >50% in bed, but not bedbound (Capable of only limited self-care, confined to bed or chair 50% or more of waking hours)  4 - Bedbound (Completely disabled. Cannot carry on any self-care. Totally confined to bed or chair)  5 - Death   Raylene MM, Creech RH, Tormey DC, et al. [phone]). Toxicity and response criteria of the [facility]. Am. DOROTHA Bridges. Oncol. 5 (6): 649-55    LABORATORY DATA:  Lab Results  Component Value Date   WBC 4.1 04/15/2024   HGB 13.2 04/15/2024   HCT 39.0 04/15/2024   MCV 88.4 04/15/2024   PLT 356 04/15/2024   Lab Results  Component Value Date   NA 139 04/15/2024   K 4.4 04/15/2024   CL 105 04/15/2024   CO2 30 04/15/2024   Lab Results  Component Value Date   ALT 17 04/15/2024   AST 18 04/15/2024   ALKPHOS 43 04/15/2024   BILITOT 0.4 04/15/2024      RADIOGRAPHY: No results found.     IMPRESSION/PLAN: 1. Delayed onset recurrent metastatic stage IIB, pT2N1M0, grade 2 ER/PR positive invasive ductal carcinoma of the left breast.  Dr. Dewey has reviewed the patient's course since her last visit.  Unfortunately her workup showed evidence of metastatic disease rather than localized recurrence and she was encouraged to consider systemic treatment.  She has been receiving Verzenio  and and letrozole .  She is scheduled to proceed with repeat PET and brain MRI on 05/12/2024.  In the meantime we base the discussion regarding her PET scan on 01/01/2024 which showed evidence of disease in her L5 vertebral body, posterior iliac bone right acetabular roof and disease in the lung and hilum.  We discussed the use of palliative radiation to treat her pain which seems to be isolated in the***location.  We  discussed that Dr. Dewey would offer 10 fractions of radiotherapy to this site.  We reviewed the risks, benefits, short and long-term effects of radiotherapy  and she is interested in proceeding.  She will come for simulation on***and we will sign written consent to proceed at that time.   This encounter was conducted via telephone.  The patient has provided two factor identification and has given verbal consent for this type of encounter and has been advised to only accept a meeting of this type in a secure network environment. The time spent during this encounter was *** minutes including preparation, discussion, and coordination of the patient's care. The attendants for this meeting include Sharene Cary, RN, Dr. Dewey, Donald Estefana Husband  and Ariauna Delima.  During the encounter,  Sharene Cary, RN, Dr. Dewey, and Donald Estefana Husband were located at [facility] Radiation Oncology Department.  Almetta Mcgilvery was located at home.      Donald KYM Husband, [facility]    **Disclaimer: This note was dictated with voice recognition software. Similar sounding words can inadvertently be transcribed and this note may contain transcription errors which may not have been corrected upon publication of note.**

## 2024-04-23 ENCOUNTER — Ambulatory Visit
Admission: RE | Admit: 2024-04-23 | Discharge: 2024-04-23 | Disposition: A | Discharge status: Home / Self Care | Source: Ambulatory Visit | Attending: Radiation Oncology | Admitting: Radiation Oncology

## 2024-04-23 ENCOUNTER — Ambulatory Visit
Admission: RE | Admit: 2024-04-23 | Discharge: 2024-04-23 | Attending: Radiation Oncology | Admitting: Radiation Oncology

## 2024-04-23 ENCOUNTER — Encounter: Payer: Self-pay | Admitting: Radiation Oncology

## 2024-04-23 DIAGNOSIS — C7951 Secondary malignant neoplasm of bone: Secondary | ICD-10-CM

## 2024-04-23 DIAGNOSIS — Z170 Estrogen receptor positive status [ER+]: Secondary | ICD-10-CM

## 2024-04-23 DIAGNOSIS — C50412 Malignant neoplasm of upper-outer quadrant of left female breast: Secondary | ICD-10-CM

## 2024-04-23 NOTE — Addendum Note (Signed)
 Encounter addended by: Lanell Donald Stagger, PA-C on: 04/23/2024 3:40 PM  Actions taken: Clinical Note Signed

## 2024-04-23 NOTE — Progress Notes (Signed)
 Histology and Location of Primary Cancer:   Sites of Visceral and Bony Metastatic Disease:  L5 vertebral body, posterior iliac bone right acetabular roof and disease in the lung and hilum.  Location(s) of Symptomatic Metastases:   Past/Anticipated chemotherapy by medical oncology, if any:    Pain on a scale of 0-10 is: 6, lower back and comes and goes   If Spine Met(s), symptoms, if any, include: Bowel/Bladder retention or incontinence (please describe): No Numbness or weakness in extremities (please describe): No Current Decadron  regimen, if applicable: no  Ambulatory status? Walker? Wheelchair?: Ambulatory  SAFETY ISSUES: Prior radiation? yes Pacemaker/ICD? no Possible current pregnancy? no Is the patient on methotrexate? no  Current Complaints / other details:  No

## 2024-04-23 NOTE — Addendum Note (Signed)
 Encounter addended by: Lanell Donald Stagger, PA-C on: 04/23/2024 3:40 PM  Actions taken: Level of Service modified

## 2024-04-24 ENCOUNTER — Encounter (HOSPITAL_COMMUNITY): Payer: Self-pay

## 2024-04-28 ENCOUNTER — Encounter: Payer: Self-pay | Admitting: Hematology and Oncology

## 2024-04-28 ENCOUNTER — Telehealth: Payer: Self-pay | Admitting: Radiation Oncology

## 2024-04-28 ENCOUNTER — Other Ambulatory Visit: Payer: Self-pay | Admitting: Hematology and Oncology

## 2024-04-28 NOTE — Telephone Encounter (Signed)
 The patient's PET scan has been approved. She will come on Wednesday for this and her MRI brain, and we're holding a spot on our schedule Thursday for simulation once we have clarity on her imaging results. She is in agreement with this plan and has asked if her infusion appt and visit with Dr. Loretha can also be moved up. I sent a message asking this as well to Dr. Loretha.

## 2024-04-30 ENCOUNTER — Ambulatory Visit (HOSPITAL_COMMUNITY)
Admission: RE | Admit: 2024-04-30 | Discharge: 2024-04-30 | Disposition: A | Discharge status: Home / Self Care | Source: Ambulatory Visit | Attending: Hematology and Oncology | Admitting: Hematology and Oncology

## 2024-04-30 ENCOUNTER — Ambulatory Visit (HOSPITAL_COMMUNITY)
Admission: RE | Admit: 2024-04-30 | Discharge: 2024-04-30 | Disposition: A | Discharge status: Home / Self Care | Source: Ambulatory Visit | Attending: Radiation Oncology | Admitting: Radiation Oncology

## 2024-04-30 DIAGNOSIS — C50919 Malignant neoplasm of unspecified site of unspecified female breast: Secondary | ICD-10-CM | POA: Insufficient documentation

## 2024-04-30 DIAGNOSIS — C7800 Secondary malignant neoplasm of unspecified lung: Secondary | ICD-10-CM | POA: Insufficient documentation

## 2024-04-30 DIAGNOSIS — C50412 Malignant neoplasm of upper-outer quadrant of left female breast: Secondary | ICD-10-CM | POA: Diagnosis present

## 2024-04-30 DIAGNOSIS — C7951 Secondary malignant neoplasm of bone: Secondary | ICD-10-CM | POA: Diagnosis present

## 2024-04-30 DIAGNOSIS — R519 Headache, unspecified: Secondary | ICD-10-CM | POA: Insufficient documentation

## 2024-04-30 DIAGNOSIS — Z170 Estrogen receptor positive status [ER+]: Secondary | ICD-10-CM | POA: Diagnosis present

## 2024-04-30 MED ORDER — FLUOROESTRADIOL F 18 4-100 MCI/ML IV SOLN
6.0300 | Freq: Once | INTRAVENOUS | Status: AC
  Administered 2024-04-30: 6.03 via INTRAVENOUS

## 2024-04-30 MED ORDER — GADOBUTROL 1 MMOL/ML IV SOLN
6.0000 mL | Freq: Once | INTRAVENOUS | Status: AC | PRN
  Administered 2024-04-30: 6 mL via INTRAVENOUS

## 2024-05-01 ENCOUNTER — Ambulatory Visit: Admission: RE | Admit: 2024-05-01 | Admitting: Radiation Oncology

## 2024-05-01 ENCOUNTER — Other Ambulatory Visit: Payer: Self-pay | Admitting: Radiation Oncology

## 2024-05-01 ENCOUNTER — Encounter: Payer: Self-pay | Admitting: Radiation Oncology

## 2024-05-01 DIAGNOSIS — C7951 Secondary malignant neoplasm of bone: Secondary | ICD-10-CM

## 2024-05-01 DIAGNOSIS — Z170 Estrogen receptor positive status [ER+]: Secondary | ICD-10-CM

## 2024-05-01 NOTE — Progress Notes (Signed)
 I saw the patient and her husband this morning and we reviewed her MRI brain and Cerianna PET scan results. It appears in reviewing her scan imaging including PET overlay, and CT overlay that the lesions in the L5 spine and right iliac bone are not as intense, and also do not extend into the region of her pain which is more right sided in the upper L spine region, and does not communicate into the thighs, but she has bilateral thigh pain extending from the region of the hip joints to the mid thigh laterally. She does not have any loss of bowel or bladder function, foot drop, saddle anesthesia, or other focal neurologic symptoms. We discussed that her systemic therapy seems to be effective in these sites, though focally the imaging from her PET images do not correlate to her symptoms, and while we could still consider offering treatment to the L5 and right iliac sites, she is interested in avoiding radiotherapy if it is not felt to be necessary. Rather, we discussed additional imaging with an MRI of the L and pelvis. She is in agreement with this and we'll order this STAT to try to coordinate a recommendation since the patient remains in pain.     Donald KYM Husband, PAC

## 2024-05-02 ENCOUNTER — Inpatient Hospital Stay: Attending: Hematology and Oncology | Admitting: Hematology and Oncology

## 2024-05-02 VITALS — BP 124/82 | HR 82 | Temp 97.7°F | Resp 18 | Ht 64.0 in | Wt 136.9 lb

## 2024-05-02 DIAGNOSIS — Z833 Family history of diabetes mellitus: Secondary | ICD-10-CM | POA: Diagnosis not present

## 2024-05-02 DIAGNOSIS — C50919 Malignant neoplasm of unspecified site of unspecified female breast: Secondary | ICD-10-CM

## 2024-05-02 DIAGNOSIS — G893 Neoplasm related pain (acute) (chronic): Secondary | ICD-10-CM | POA: Diagnosis not present

## 2024-05-02 DIAGNOSIS — Z923 Personal history of irradiation: Secondary | ICD-10-CM | POA: Diagnosis not present

## 2024-05-02 DIAGNOSIS — Z1732 Human epidermal growth factor receptor 2 negative status: Secondary | ICD-10-CM | POA: Diagnosis not present

## 2024-05-02 DIAGNOSIS — Z170 Estrogen receptor positive status [ER+]: Secondary | ICD-10-CM | POA: Insufficient documentation

## 2024-05-02 DIAGNOSIS — C7951 Secondary malignant neoplasm of bone: Secondary | ICD-10-CM | POA: Insufficient documentation

## 2024-05-02 DIAGNOSIS — M8580 Other specified disorders of bone density and structure, unspecified site: Secondary | ICD-10-CM | POA: Insufficient documentation

## 2024-05-02 DIAGNOSIS — Z9010 Acquired absence of unspecified breast and nipple: Secondary | ICD-10-CM | POA: Insufficient documentation

## 2024-05-02 DIAGNOSIS — Z8042 Family history of malignant neoplasm of prostate: Secondary | ICD-10-CM | POA: Diagnosis not present

## 2024-05-02 DIAGNOSIS — Z79810 Long term (current) use of selective estrogen receptor modulators (SERMs): Secondary | ICD-10-CM | POA: Insufficient documentation

## 2024-05-02 DIAGNOSIS — Z79811 Long term (current) use of aromatase inhibitors: Secondary | ICD-10-CM | POA: Insufficient documentation

## 2024-05-02 DIAGNOSIS — R519 Headache, unspecified: Secondary | ICD-10-CM | POA: Insufficient documentation

## 2024-05-02 DIAGNOSIS — Z1721 Progesterone receptor positive status: Secondary | ICD-10-CM | POA: Diagnosis not present

## 2024-05-02 DIAGNOSIS — Z79899 Other long term (current) drug therapy: Secondary | ICD-10-CM | POA: Diagnosis not present

## 2024-05-02 DIAGNOSIS — C50412 Malignant neoplasm of upper-outer quadrant of left female breast: Secondary | ICD-10-CM | POA: Insufficient documentation

## 2024-05-02 DIAGNOSIS — Z8052 Family history of malignant neoplasm of bladder: Secondary | ICD-10-CM | POA: Diagnosis not present

## 2024-05-02 DIAGNOSIS — C7800 Secondary malignant neoplasm of unspecified lung: Secondary | ICD-10-CM

## 2024-05-02 NOTE — Progress Notes (Signed)
 [facility] CONSULT NOTE  Patient Care Team: Dyane Anthony RAMAN, FNP as PCP - General (Family Medicine) Jason Charleston, MD (Inactive) as Consulting Physician (Radiation Oncology) Loretha Ash, MD as Consulting Physician (Hematology and Oncology) Tyree Nanetta SAILOR, RN as Oncology Nurse Navigator Ebbie Cough, MD as Consulting Physician (General Surgery) Dewey Rush, MD as Consulting Physician (Radiation Oncology)  CHIEF COMPLAINTS/PURPOSE OF CONSULTATION:  Newly diagnosed breast cancer  HISTORY OF PRESENTING ILLNESS:  Erin Castillo 58 y.o. female is here because of recent diagnosis of left breast cancer  I reviewed her records extensively and collaborated the history with the patient.  SUMMARY OF ONCOLOGIC HISTORY: Oncology History  Malignant neoplasm of upper-outer quadrant of left breast in female, estrogen receptor positive (HCC)  04/26/2011 Initial Diagnosis   Malignant neoplasm of upper-outer quadrant of left breast in female, estrogen receptor positive (HCC)   11/30/2023 Mammogram   Focal asymmetry with calcifications in the left breast is indeterminate. Additional views with US  is recommended. US  in the breast   12/11/2023 Pathology Results   Left breast needle core biopsy showed invasive poorly differentiated adenocarcinoma grade 3 prognostic showed ER 100% positive strong staining PR 60% positive strong staining Ki-67 of 30% and group 5 HER2 negative    Genetic Testing   [facility] CancerNext-Expanded Panel+RNA was Negative. Report date is 01/06/2024.   The CancerNext-Expanded gene panel offered by [facility] and includes sequencing, rearrangement, and RNA analysis for the following 77 genes: AIP, ALK, APC, ATM, AXIN2, BAP1, BARD1, BMPR1A, BRCA1, BRCA2, BRIP1, CDC73, CDH1, CDK4, CDKN1B, CDKN2A, CEBPA, CHEK2, CTNNA1, DDX41, DICER1, ETV6, FH, FLCN, GATA2, LZTR1, MAX, MBD4, MEN1, MET, MLH1, MSH2, MSH3, MSH6, MUTYH, NF1, NF2, NTHL1, PALB2, PHOX2B, PMS2, POT1,  PRKAR1A, PTCH1, PTEN, RAD51C, RAD51D, RB1, RET, RPS20, RUNX1, SDHA, SDHAF2, SDHB, SDHC, SDHD, SMAD4, SMARCA4, SMARCB1, SMARCE1, STK11, SUFU, TMEM127, TP53, TSC1, TSC2, VHL, and WT1 (sequencing and deletion/duplication); EGFR, HOXB13, KIT, MITF, PDGFRA, POLD1, and POLE (sequencing only); EPCAM and GREM1 (deletion/duplication only).      Discussed the use of AI scribe software for clinical note transcription with the patient, who gave verbal consent to proceed.  History of Present Illness  Erin Castillo is a 58 year old female with metastatic breast cancer who presents for follow-up regarding her recent imaging and ongoing symptoms such as left hip pain and tingling in both legs.  Recent scans, including a PET scan, showed improvement in her bones, lymph nodes, and breast.   She experiences headaches that have become less frequent but can be severe when they occur. She is monitoring her water intake to see if it correlates with the headaches.  She will be doing MRI spine and will connect with rad onc again.  Rest of the pertinent 10 point ROS reviewed and negative  MEDICAL HISTORY:  Past Medical History:  Diagnosis Date   Achilles tendonitis    Anxiety    Breast cancer (HCC) 04/20/2011   Left, ER/PR+   Chest pain    hx - no current problem   Depression    Dyspareunia in female    GAD (generalized anxiety disorder)    Heel spur, left    History of radiation therapy    09/18/11 to 11/03/11, left breast   Hypercholesterolemia    Hypotension    Insomnia    occasional   Migraines    Osteopenia    Postmenopausal atrophic vaginitis    Pulmonary nodule 12/2023   right lower lobe   Ulcerative colitis (HCC)  SURGICAL HISTORY: Past Surgical History:  Procedure Laterality Date   BREAST LUMPECTOMY  05/15/11   left breast lumpectomy    CESAREAN SECTION  2000   ENDOBRONCHIAL ULTRASOUND  01/15/2024   Procedure: ENDOBRONCHIAL ULTRASOUND (EBUS);  Surgeon: Shelah Lamar RAMAN, MD;  Location:  [facility] ENDOSCOPY;  Service: Pulmonary;;   MASTECTOMY PARTIAL / LUMPECTOMY W/ AXILLARY LYMPHADENECTOMY  10/12   snbx   NASAL SINUS SURGERY  1991   PORT-A-CATH REMOVAL  12/12/2011   Procedure: REMOVAL PORT-A-CATH;  Surgeon: Morene ONEIDA Olives, MD;  Location: [facility] ORS;  Service: General;  Laterality: Right;   PORTACATH PLACEMENT  06/12/2011   Procedure: INSERTION PORT-A-CATH;  Surgeon: Morene ONEIDA Olives, MD;  Location: [facility];  Service: General;  Laterality: Right;  Right Subclavian Vein   SINUS EXPLORATION     remote   VIDEO BRONCHOSCOPY WITH ENDOBRONCHIAL NAVIGATION Right 01/15/2024   Procedure: VIDEO BRONCHOSCOPY WITH ENDOBRONCHIAL NAVIGATION;  Surgeon: Shelah Lamar RAMAN, MD;  Location: [facility] ENDOSCOPY;  Service: Pulmonary;  Laterality: Right;    SOCIAL HISTORY: Social History   Socioeconomic History   Marital status: Married    Spouse name: Not on file   Number of children: Not on file   Years of education: Not on file   Highest education level: Not on file  Occupational History   Occupation: Print production planner  Tobacco Use   Smoking status: Never    Passive exposure: Never   Smokeless tobacco: Never  Vaping Use   Vaping status: Never Used  Substance and Sexual Activity   Alcohol use: Yes    Alcohol/week: 2.0 - 3.0 standard drinks of alcohol    Types: 2 - 3 Glasses of wine per week   Drug use: No   Sexual activity: Yes    Birth control/protection: Post-menopausal  Other Topics Concern   Not on file  Social History Narrative   3 children   Social Drivers of Corporate investment banker Strain: Not on file  Food Insecurity: No Food Insecurity (04/23/2024)   Hunger Vital Sign    Worried About Running Out of Food in the Last Year: Never true    Ran Out of Food in the Last Year: Never true  Transportation Needs: No Transportation Needs (04/23/2024)   PRAPARE - Administrator, Civil Service (Medical): No    Lack of Transportation (Non-Medical): No   Physical Activity: Not on file  Stress: Not on file  Social Connections: Not on file  Intimate Partner Violence: Not At Risk (04/23/2024)   Humiliation, Afraid, Rape, and Kick questionnaire    Fear of Current or Ex-Partner: No    Emotionally Abused: No    Physically Abused: No    Sexually Abused: No    FAMILY HISTORY: Family History  Problem Relation Age of Onset   Bladder Cancer Father 66   Diabetes Father    Prostate cancer Paternal Grandfather     ALLERGIES:  has no known allergies.  MEDICATIONS:  Current Outpatient Medications  Medication Sig Dispense Refill   abemaciclib  (VERZENIO ) 150 MG tablet Take 1 tablet (150 mg total) by mouth 2 (two) times daily. 56 tablet 3   b complex vitamins tablet Take 1 tablet by mouth daily.      Cholecalciferol (VITAMIN D3) 25 MCG (1000 UT) CAPS 1 capsule.     clobetasol cream (TEMOVATE) 0.05 % Apply 1 Application topically.     letrozole  (FEMARA ) 2.5 MG tablet Take 1 tablet (2.5 mg total) by mouth daily.  90 tablet 3   melatonin 5 MG TABS Take 5 mg by mouth.     mesalamine (LIALDA) 1.2 G EC tablet Take 1,200 mg by mouth daily with breakfast.     Multiple Vitamin (MULITIVITAMIN WITH MINERALS) TABS Take 1 tablet by mouth daily.     nitroGLYCERIN  (NITROSTAT ) 0.4 MG SL tablet Place 1 tablet (0.4 mg total) under the tongue every 5 (five) minutes as needed for chest pain. 25 tablet 3   ondansetron  (ZOFRAN ) 8 MG tablet Take 1 tablet (8 mg total) by mouth every 8 (eight) hours as needed for nausea or vomiting. 30 tablet 2   SUMAtriptan (IMITREX) 50 MG tablet Take 50 mg by mouth every 2 (two) hours as needed for migraine. May repeat in 2 hours if headache persists or recurs.     traMADol  (ULTRAM ) 50 MG tablet Take 1 tablet (50 mg total) by mouth every 12 (twelve) hours as needed. 30 tablet 0   venlafaxine  XR (EFFEXOR -XR) 37.5 MG 24 hr capsule TAKE 2 CAPSULES (75 MG TOTAL) BY MOUTH DAILY. 180 capsule 2   No current facility-administered medications  for this visit.    REVIEW OF SYSTEMS:   Constitutional: Denies fevers, chills or abnormal night sweats Eyes: Denies blurriness of vision, double vision or watery eyes Ears, nose, mouth, throat, and face: Denies mucositis or sore throat Respiratory: Denies cough, dyspnea or wheezes Cardiovascular: Denies palpitation, chest discomfort or lower extremity swelling Gastrointestinal:  Denies nausea, heartburn or change in bowel habits Skin: Denies abnormal skin rashes Lymphatics: Denies new lymphadenopathy or easy bruising Neurological:Denies numbness, tingling or new weaknesses Behavioral/Psych: Mood is stable, no new changes  Breast: Denies any palpable lumps or discharge All other systems were reviewed with the patient and are negative.  PHYSICAL EXAMINATION: ECOG PERFORMANCE STATUS: 0 - Asymptomatic  Vitals:   05/02/24 0914  BP: 124/82  Pulse: 82  Resp: 18  Temp: 97.7 F (36.5 C)  SpO2: 100%       Filed Weights   05/02/24 0914  Weight: 136 lb 14.4 oz (62.1 kg)      GENERAL:alert, no distress and comfortable No palpable cervical or supraclavicular adenopathy Chest : CTA bilaterally RRR Abd: soft, non distended,  No LE edema.   LABORATORY DATA:  I have reviewed the data as listed Lab Results  Component Value Date   WBC 4.1 04/15/2024   HGB 13.2 04/15/2024   HCT 39.0 04/15/2024   MCV 88.4 04/15/2024   PLT 356 04/15/2024   Lab Results  Component Value Date   NA 139 04/15/2024   K 4.4 04/15/2024   CL 105 04/15/2024   CO2 30 04/15/2024    RADIOGRAPHIC STUDIES: I have personally reviewed the radiological reports and agreed with the findings in the report.  ASSESSMENT AND PLAN:   58 y.o. BRCA negative [place] woman    (1) Status post left lumpectomy and sentinel lymph node biopsy October of 2012 for a T2 N1, stage IIB invasive ductal carcinoma, grade 2,  strongly estrogen and progesterone receptor positive, with an MIB-1 of 9% and no HER-2/neu  amplification.    (2) Treated initially with docetaxel , doxorubicin , and cyclophosphamide  for one cycle, with very poor tolerance   (3) status post cyclophosphamide / docetaxel , for an additional three cycles completed January 2013   (4)  Status post radiation, completed mid-April 2013   (5)  on tamoxifen  as of April 2013-- switched to anastrozole  March 2018, to be discontinued February 2020             (  a) bone density January 2019 shows a T score of -1.7  (6) in May 2025 she had a mammogram and an ultrasound which showed a 2 x 1.7 x 2 cm irregular mass highly suggestive of malignancy.  This showed invasive poorly differentiated adenocarcinoma grade 3, ER 100% positive strong staining PR 60% positive strong staining, Ki-67 of 30% group 5 HER2 negative  (7) we did staging scans given concern for local recurrence which showed right lower lobe lung nodule highly suspicious for metastatic disease versus a second primary.  Hence we proceeded with the PET/CT which showed multiple sites concerning for metastatic disease including mediastinal adenopathy, lung, bone disease.  (8)Bronchoscopy confirmed met breast cancer, ER pos, PR pos, Her 2 1+   (9) She is on verzenio  and letrozole , zometa   PLAN  Continue Verzenio  and letrozole  with zometa  as planned Recent imaging consistent with response. Continue MR imaging as recommended by rad onc for consideration of palliative radiation. No concerns on exam today. Cancer related pain, ok to use tramadol  PRN Follow up with me as scheduled.  Time spent: 30 min  All questions were answered. The patient knows to call the clinic with any problems, questions or concerns.    Amber Stalls, MD 05/02/24

## 2024-05-05 ENCOUNTER — Other Ambulatory Visit (HOSPITAL_COMMUNITY)

## 2024-05-05 ENCOUNTER — Ambulatory Visit (HOSPITAL_COMMUNITY)

## 2024-05-05 ENCOUNTER — Encounter (HOSPITAL_COMMUNITY): Payer: Self-pay

## 2024-05-06 ENCOUNTER — Ambulatory Visit: Admitting: Adult Health

## 2024-05-06 ENCOUNTER — Ambulatory Visit

## 2024-05-06 ENCOUNTER — Other Ambulatory Visit

## 2024-05-07 ENCOUNTER — Other Ambulatory Visit: Payer: Self-pay | Admitting: Hematology and Oncology

## 2024-05-12 ENCOUNTER — Inpatient Hospital Stay

## 2024-05-12 ENCOUNTER — Ambulatory Visit (HOSPITAL_COMMUNITY)
Admission: RE | Admit: 2024-05-12 | Discharge: 2024-05-12 | Disposition: A | Source: Ambulatory Visit | Attending: Radiation Oncology | Admitting: Radiation Oncology

## 2024-05-12 ENCOUNTER — Ambulatory Visit: Admitting: Hematology and Oncology

## 2024-05-12 ENCOUNTER — Encounter (HOSPITAL_COMMUNITY)

## 2024-05-12 ENCOUNTER — Other Ambulatory Visit (HOSPITAL_COMMUNITY)

## 2024-05-12 ENCOUNTER — Inpatient Hospital Stay: Admitting: Hematology and Oncology

## 2024-05-12 ENCOUNTER — Ambulatory Visit (HOSPITAL_COMMUNITY)
Admission: RE | Admit: 2024-05-12 | Discharge: 2024-05-12 | Disposition: A | Discharge status: Home / Self Care | Source: Ambulatory Visit | Attending: Radiation Oncology | Admitting: Radiation Oncology

## 2024-05-12 VITALS — BP 112/81 | HR 86 | Temp 98.0°F | Resp 16

## 2024-05-12 DIAGNOSIS — Z170 Estrogen receptor positive status [ER+]: Secondary | ICD-10-CM

## 2024-05-12 DIAGNOSIS — C50919 Malignant neoplasm of unspecified site of unspecified female breast: Secondary | ICD-10-CM

## 2024-05-12 DIAGNOSIS — C7951 Secondary malignant neoplasm of bone: Secondary | ICD-10-CM

## 2024-05-12 DIAGNOSIS — C50412 Malignant neoplasm of upper-outer quadrant of left female breast: Secondary | ICD-10-CM | POA: Insufficient documentation

## 2024-05-12 LAB — CMP (CANCER CENTER ONLY)
ALT: 10 U/L (ref 0–44)
AST: 17 U/L (ref 15–41)
Albumin: 4.4 g/dL (ref 3.5–5.0)
Alkaline Phosphatase: 37 U/L — ABNORMAL LOW (ref 38–126)
Anion gap: 8 (ref 5–15)
BUN: 17 mg/dL (ref 6–20)
CO2: 26 mmol/L (ref 22–32)
Calcium: 9.7 mg/dL (ref 8.9–10.3)
Chloride: 104 mmol/L (ref 98–111)
Creatinine: 0.88 mg/dL (ref 0.44–1.00)
GFR, Estimated: >60 mL/min (ref >60)
Glucose, Bld: 80 mg/dL (ref 70–99)
Potassium: 4.2 mmol/L (ref 3.5–5.1)
Sodium: 138 mmol/L (ref 135–145)
Total Bilirubin: 0.3 mg/dL (ref 0.0–1.2)
Total Protein: 7.3 g/dL (ref 6.5–8.1)

## 2024-05-12 LAB — CBC WITH DIFFERENTIAL (CANCER CENTER ONLY)
Abs Immature Granulocytes: 0.02 K/uL (ref 0.00–0.07)
Basophils Absolute: 0.1 K/uL (ref 0.0–0.1)
Basophils Relative: 2 %
Eosinophils Absolute: 0.1 K/uL (ref 0.0–0.5)
Eosinophils Relative: 2 %
HCT: 39.9 % (ref 36.0–46.0)
Hemoglobin: 13.6 g/dL (ref 12.0–15.0)
Immature Granulocytes: 0 %
Lymphocytes Relative: 28 %
Lymphs Abs: 1.3 K/uL (ref 0.7–4.0)
MCH: 31.3 pg (ref 26.0–34.0)
MCHC: 34.1 g/dL (ref 30.0–36.0)
MCV: 91.9 fL (ref 80.0–100.0)
Monocytes Absolute: 0.4 K/uL (ref 0.1–1.0)
Monocytes Relative: 9 %
Neutro Abs: 2.8 K/uL (ref 1.7–7.7)
Neutrophils Relative %: 59 %
Platelet Count: 256 K/uL (ref 150–400)
RBC: 4.34 MIL/uL (ref 3.87–5.11)
RDW: 16.4 % — ABNORMAL HIGH (ref 11.5–15.5)
WBC Count: 4.7 K/uL (ref 4.0–10.5)
nRBC: 0 % (ref 0.0–0.2)

## 2024-05-12 MED ORDER — SODIUM CHLORIDE 0.9 % IV SOLN: INTRAVENOUS | Status: DC

## 2024-05-12 MED ORDER — GADOBUTROL 1 MMOL/ML IV SOLN
6.0000 mL | Freq: Once | INTRAVENOUS | Status: AC | PRN
  Administered 2024-05-12: 6 mL via INTRAVENOUS

## 2024-05-12 MED ORDER — ZOLEDRONIC ACID 4 MG/100ML IV SOLN
4.0000 mg | Freq: Once | INTRAVENOUS | Status: AC
  Administered 2024-05-12: 4 mg via INTRAVENOUS
  Filled 2024-05-12: qty 100

## 2024-05-12 NOTE — Patient Instructions (Signed)

## 2024-05-13 ENCOUNTER — Telehealth: Payer: Self-pay | Admitting: Radiation Oncology

## 2024-05-13 ENCOUNTER — Ambulatory Visit: Payer: Self-pay | Admitting: Radiation Oncology

## 2024-05-13 LAB — CANCER ANTIGEN 15-3: CA 15-3: 103 U/mL — ABNORMAL HIGH (ref 0.0–25.0)

## 2024-05-13 LAB — CANCER ANTIGEN 27.29: CA 27.29: 125.8 U/mL — ABNORMAL HIGH (ref 0.0–38.6)

## 2024-05-13 NOTE — Telephone Encounter (Signed)
 Pt called back and we discussed MRI results. Dr. Dewey is open to treating L5, right iliac lesion measuring 3.7 cm, and bilateral iliac lesions below the larger. He would recommend 10 fractions of therapy.  We discussed the risks, benefits, short, and long term effects of radiotherapy, as well as the palliative intent. She will come on 05/28/24 for simulation when she is back in town seeing Dr. Loretha.

## 2024-05-13 NOTE — Telephone Encounter (Signed)
 LM for patient regarding her scans and asked if she would call back to discuss.

## 2024-05-15 ENCOUNTER — Telehealth: Payer: Self-pay | Admitting: Radiation Oncology

## 2024-05-15 ENCOUNTER — Other Ambulatory Visit: Payer: Self-pay | Admitting: Hematology and Oncology

## 2024-05-15 ENCOUNTER — Telehealth: Payer: Self-pay

## 2024-05-15 DIAGNOSIS — C50412 Malignant neoplasm of upper-outer quadrant of left female breast: Secondary | ICD-10-CM

## 2024-05-15 DIAGNOSIS — C50919 Malignant neoplasm of unspecified site of unspecified female breast: Secondary | ICD-10-CM

## 2024-05-15 NOTE — Telephone Encounter (Signed)
 I spoke with the patient and she's interested in delaying radiation simulation and treatment by another few weeks. She will sim on 11/26 and begin treatment on 12/1

## 2024-05-15 NOTE — Telephone Encounter (Signed)
 Msg received from Donald Husband, [place] today requesting that Erin Castillo be rescheduled for 06/18/24 after her CT SIM. Her lab appt was scheduled to 11/26 1300 and MD visit 11/26 at 1430. Called pt and LVM letting her know this has been changed and to call the office back if needed.

## 2024-05-15 NOTE — Progress Notes (Signed)
 PET re ordered, unfortunately previous CERIANNA can't be exactly compared to the original PET scan and her tumor markers continue to increase.  Erin Castillo

## 2024-05-16 ENCOUNTER — Telehealth: Payer: Self-pay

## 2024-05-16 NOTE — Telephone Encounter (Signed)
-----   Message from Nurse Leita B sent at 05/16/2024  4:56 PM EDT ----- Attempted to call pt to make her aware. LVM for call back. Do you want to schedule a telephone f/u post NM PET? Currently scheduled to see you 11/26 when she comes for CT SIM. [place] ----- Message ----- From: Loretha Ash, MD Sent: 05/15/2024   5:30 PM EDT To: JINNY Boxer, MD; Leita KANDICE Daring, LPN; [facility] Bc 2  Ok thank you, will do that.  Leita, I am repeating PET scan, ordered, can u let her know. ----- Message ----- From: Boxer JINNY, MD Sent: 05/14/2024   5:29 PM EDT To: Ash Loretha, MD  I dont see any new lesions.. The estradiol sites and FDG sites match.  The Mediastinal lymph nodes and pulmonary nodal are a bit smaller on the current E2 PET.   Maybe consider repeat FDG PET ----- Message ----- From: Loretha Ash, MD Sent: 05/13/2024   7:04 PM EDT To: JINNY Boxer, MD; Leita KANDICE Daring, LPN  Dr Boxer  Are you able to compare the original PET to the ceriarnna PET by any chance or is that not possible. Our only worry is that the tumor marker doubled in the past 2 months and we are worried if this line of treatment is not working.   Thanks for looking into this.

## 2024-05-28 ENCOUNTER — Inpatient Hospital Stay

## 2024-05-28 ENCOUNTER — Encounter (HOSPITAL_COMMUNITY)
Admission: RE | Admit: 2024-05-28 | Discharge: 2024-05-28 | Disposition: A | Discharge status: Home / Self Care | Source: Ambulatory Visit | Attending: Hematology and Oncology | Admitting: Hematology and Oncology

## 2024-05-28 ENCOUNTER — Inpatient Hospital Stay: Admitting: Hematology and Oncology

## 2024-05-28 ENCOUNTER — Ambulatory Visit: Payer: Self-pay | Admitting: Radiation Oncology

## 2024-05-28 ENCOUNTER — Encounter: Payer: Self-pay | Admitting: Hematology and Oncology

## 2024-05-28 DIAGNOSIS — Z170 Estrogen receptor positive status [ER+]: Secondary | ICD-10-CM | POA: Diagnosis present

## 2024-05-28 DIAGNOSIS — C50919 Malignant neoplasm of unspecified site of unspecified female breast: Secondary | ICD-10-CM | POA: Insufficient documentation

## 2024-05-28 DIAGNOSIS — C7800 Secondary malignant neoplasm of unspecified lung: Secondary | ICD-10-CM | POA: Insufficient documentation

## 2024-05-28 DIAGNOSIS — C50412 Malignant neoplasm of upper-outer quadrant of left female breast: Secondary | ICD-10-CM | POA: Diagnosis present

## 2024-05-28 LAB — GLUCOSE, CAPILLARY: Glucose-Capillary: 87 mg/dL (ref 70–99)

## 2024-05-28 MED ORDER — FLUDEOXYGLUCOSE F - 18 (FDG) INJECTION
6.7000 | Freq: Once | INTRAVENOUS | Status: AC
  Administered 2024-05-28: 6.7 via INTRAVENOUS

## 2024-06-02 ENCOUNTER — Encounter: Payer: Self-pay | Admitting: Hematology and Oncology

## 2024-06-04 ENCOUNTER — Inpatient Hospital Stay: Attending: Hematology and Oncology | Admitting: Hematology and Oncology

## 2024-06-04 ENCOUNTER — Telehealth: Payer: Self-pay

## 2024-06-04 DIAGNOSIS — R530 Neoplastic (malignant) related fatigue: Secondary | ICD-10-CM | POA: Insufficient documentation

## 2024-06-04 DIAGNOSIS — Z79810 Long term (current) use of selective estrogen receptor modulators (SERMs): Secondary | ICD-10-CM | POA: Insufficient documentation

## 2024-06-04 DIAGNOSIS — C50412 Malignant neoplasm of upper-outer quadrant of left female breast: Secondary | ICD-10-CM | POA: Insufficient documentation

## 2024-06-04 DIAGNOSIS — K5900 Constipation, unspecified: Secondary | ICD-10-CM | POA: Insufficient documentation

## 2024-06-04 DIAGNOSIS — Z9010 Acquired absence of unspecified breast and nipple: Secondary | ICD-10-CM | POA: Insufficient documentation

## 2024-06-04 DIAGNOSIS — Z170 Estrogen receptor positive status [ER+]: Secondary | ICD-10-CM | POA: Diagnosis not present

## 2024-06-04 DIAGNOSIS — Z79899 Other long term (current) drug therapy: Secondary | ICD-10-CM | POA: Insufficient documentation

## 2024-06-04 DIAGNOSIS — C7800 Secondary malignant neoplasm of unspecified lung: Secondary | ICD-10-CM

## 2024-06-04 DIAGNOSIS — Z923 Personal history of irradiation: Secondary | ICD-10-CM | POA: Insufficient documentation

## 2024-06-04 DIAGNOSIS — N6489 Other specified disorders of breast: Secondary | ICD-10-CM | POA: Insufficient documentation

## 2024-06-04 DIAGNOSIS — C50919 Malignant neoplasm of unspecified site of unspecified female breast: Secondary | ICD-10-CM | POA: Diagnosis not present

## 2024-06-04 DIAGNOSIS — Z8042 Family history of malignant neoplasm of prostate: Secondary | ICD-10-CM | POA: Insufficient documentation

## 2024-06-04 DIAGNOSIS — Z833 Family history of diabetes mellitus: Secondary | ICD-10-CM | POA: Insufficient documentation

## 2024-06-04 DIAGNOSIS — E7800 Pure hypercholesterolemia, unspecified: Secondary | ICD-10-CM | POA: Insufficient documentation

## 2024-06-04 DIAGNOSIS — R519 Headache, unspecified: Secondary | ICD-10-CM | POA: Diagnosis not present

## 2024-06-04 DIAGNOSIS — Z79811 Long term (current) use of aromatase inhibitors: Secondary | ICD-10-CM | POA: Insufficient documentation

## 2024-06-04 DIAGNOSIS — Z1721 Progesterone receptor positive status: Secondary | ICD-10-CM | POA: Insufficient documentation

## 2024-06-04 DIAGNOSIS — Z8052 Family history of malignant neoplasm of bladder: Secondary | ICD-10-CM | POA: Insufficient documentation

## 2024-06-04 DIAGNOSIS — N644 Mastodynia: Secondary | ICD-10-CM | POA: Insufficient documentation

## 2024-06-04 DIAGNOSIS — Z1732 Human epidermal growth factor receptor 2 negative status: Secondary | ICD-10-CM | POA: Insufficient documentation

## 2024-06-04 NOTE — Telephone Encounter (Signed)
 Attempted to call pt X2 to schedule telephone visit since she lives 3 hours away. LVM for call back.

## 2024-06-04 NOTE — Telephone Encounter (Signed)
 Pt scheduled and agreeable to come in 11/17 at 1300 for Guardant 360 draw. Reminder left for oncoming nurse to complete requisition before her visit Monday.

## 2024-06-06 ENCOUNTER — Inpatient Hospital Stay: Admitting: Hematology and Oncology

## 2024-06-06 ENCOUNTER — Encounter: Payer: Self-pay | Admitting: Hematology and Oncology

## 2024-06-06 NOTE — Progress Notes (Signed)
 [facility] CONSULT NOTE  Patient Care Team: Dyane Anthony RAMAN, FNP as PCP - General (Family Medicine) Jason Charleston, MD (Inactive) as Consulting Physician (Radiation Oncology) Loretha Ash, MD as Consulting Physician (Hematology and Oncology) Tyree Nanetta SAILOR, RN as Oncology Nurse Navigator Ebbie Cough, MD as Consulting Physician (General Surgery) Dewey Rush, MD as Consulting Physician (Radiation Oncology)  CHIEF COMPLAINTS/PURPOSE OF CONSULTATION:  Newly diagnosed breast cancer  HISTORY OF PRESENTING ILLNESS:  Erin Castillo 58 y.o. female is here because of recent diagnosis of left breast cancer  I reviewed her records extensively and collaborated the history with the patient.  SUMMARY OF ONCOLOGIC HISTORY: Oncology History  Malignant neoplasm of upper-outer quadrant of left breast in female, estrogen receptor positive (HCC)  04/26/2011 Initial Diagnosis   Malignant neoplasm of upper-outer quadrant of left breast in female, estrogen receptor positive (HCC)   11/30/2023 Mammogram   Focal asymmetry with calcifications in the left breast is indeterminate. Additional views with US  is recommended. US  in the breast   12/11/2023 Pathology Results   Left breast needle core biopsy showed invasive poorly differentiated adenocarcinoma grade 3 prognostic showed ER 100% positive strong staining PR 60% positive strong staining Ki-67 of 30% and group 5 HER2 negative    Genetic Testing   [facility] CancerNext-Expanded Panel+RNA was Negative. Report date is 01/06/2024.   The CancerNext-Expanded gene panel offered by [facility] and includes sequencing, rearrangement, and RNA analysis for the following 77 genes: AIP, ALK, APC, ATM, AXIN2, BAP1, BARD1, BMPR1A, BRCA1, BRCA2, BRIP1, CDC73, CDH1, CDK4, CDKN1B, CDKN2A, CEBPA, CHEK2, CTNNA1, DDX41, DICER1, ETV6, FH, FLCN, GATA2, LZTR1, MAX, MBD4, MEN1, MET, MLH1, MSH2, MSH3, MSH6, MUTYH, NF1, NF2, NTHL1, PALB2, PHOX2B, PMS2, POT1,  PRKAR1A, PTCH1, PTEN, RAD51C, RAD51D, RB1, RET, RPS20, RUNX1, SDHA, SDHAF2, SDHB, SDHC, SDHD, SMAD4, SMARCA4, SMARCB1, SMARCE1, STK11, SUFU, TMEM127, TP53, TSC1, TSC2, VHL, and WT1 (sequencing and deletion/duplication); EGFR, HOXB13, KIT, MITF, PDGFRA, POLD1, and POLE (sequencing only); EPCAM and GREM1 (deletion/duplication only).      Discussed the use of AI scribe software for clinical note transcription with the patient, who gave verbal consent to proceed.  History of Present Illness  Erin Castillo is a 58 year old female with metastatic breast cancer who presents for follow-up regarding her recent imaging via telephone follow up.  Rest of the pertinent 10 point ROS reviewed and negative  MEDICAL HISTORY:  Past Medical History:  Diagnosis Date   Achilles tendonitis    Anxiety    Breast cancer (HCC) 04/20/2011   Left, ER/PR+   Chest pain    hx - no current problem   Depression    Dyspareunia in female    GAD (generalized anxiety disorder)    Heel spur, left    History of radiation therapy    09/18/11 to 11/03/11, left breast   Hypercholesterolemia    Hypotension    Insomnia    occasional   Migraines    Osteopenia    Postmenopausal atrophic vaginitis    Pulmonary nodule 12/2023   right lower lobe   Ulcerative colitis (HCC)     SURGICAL HISTORY: Past Surgical History:  Procedure Laterality Date   BREAST LUMPECTOMY  05/15/11   left breast lumpectomy    CESAREAN SECTION  2000   ENDOBRONCHIAL ULTRASOUND  01/15/2024   Procedure: ENDOBRONCHIAL ULTRASOUND (EBUS);  Surgeon: Shelah Charleston RAMAN, MD;  Location: [facility] ENDOSCOPY;  Service: Pulmonary;;   MASTECTOMY PARTIAL / LUMPECTOMY W/ AXILLARY LYMPHADENECTOMY  10/12   snbx   NASAL  SINUS SURGERY  1991   PORT-A-CATH REMOVAL  12/12/2011   Procedure: REMOVAL PORT-A-CATH;  Surgeon: Morene ONEIDA Olives, MD;  Location: [facility];  Service: General;  Laterality: Right;   PORTACATH PLACEMENT  06/12/2011   Procedure: INSERTION PORT-A-CATH;   Surgeon: Morene ONEIDA Olives, MD;  Location: [facility];  Service: General;  Laterality: Right;  Right Subclavian Vein   SINUS EXPLORATION     remote   VIDEO BRONCHOSCOPY WITH ENDOBRONCHIAL NAVIGATION Right 01/15/2024   Procedure: VIDEO BRONCHOSCOPY WITH ENDOBRONCHIAL NAVIGATION;  Surgeon: Shelah Lamar RAMAN, MD;  Location: [facility] ENDOSCOPY;  Service: Pulmonary;  Laterality: Right;    SOCIAL HISTORY: Social History   Socioeconomic History   Marital status: Married    Spouse name: Not on file   Number of children: Not on file   Years of education: Not on file   Highest education level: Not on file  Occupational History   Occupation: print production planner  Tobacco Use   Smoking status: Never    Passive exposure: Never   Smokeless tobacco: Never  Vaping Use   Vaping status: Never Used  Substance and Sexual Activity   Alcohol use: Yes    Alcohol/week: 2.0 - 3.0 standard drinks of alcohol    Types: 2 - 3 Glasses of wine per week   Drug use: No   Sexual activity: Yes    Birth control/protection: Post-menopausal  Other Topics Concern   Not on file  Social History Narrative   3 children   Social Drivers of Corporate Investment Banker Strain: Not on file  Food Insecurity: No Food Insecurity (04/23/2024)   Hunger Vital Sign    Worried About Running Out of Food in the Last Year: Never true    Ran Out of Food in the Last Year: Never true  Transportation Needs: No Transportation Needs (04/23/2024)   PRAPARE - Administrator, Civil Service (Medical): No    Lack of Transportation (Non-Medical): No  Physical Activity: Not on file  Stress: Not on file  Social Connections: Not on file  Intimate Partner Violence: Not At Risk (04/23/2024)   Humiliation, Afraid, Rape, and Kick questionnaire    Fear of Current or Ex-Partner: No    Emotionally Abused: No    Physically Abused: No    Sexually Abused: No    FAMILY HISTORY: Family History  Problem Relation Age of Onset    Bladder Cancer Father 40   Diabetes Father    Prostate cancer Paternal Grandfather     ALLERGIES:  has no known allergies.  MEDICATIONS:  Current Outpatient Medications  Medication Sig Dispense Refill   b complex vitamins tablet Take 1 tablet by mouth daily.      Cholecalciferol (VITAMIN D3) 25 MCG (1000 UT) CAPS 1 capsule.     clobetasol cream (TEMOVATE) 0.05 % Apply 1 Application topically.     letrozole  (FEMARA ) 2.5 MG tablet Take 1 tablet (2.5 mg total) by mouth daily. 90 tablet 3   melatonin 5 MG TABS Take 5 mg by mouth.     mesalamine (LIALDA) 1.2 G EC tablet Take 1,200 mg by mouth daily with breakfast.     Multiple Vitamin (MULITIVITAMIN WITH MINERALS) TABS Take 1 tablet by mouth daily.     nitroGLYCERIN  (NITROSTAT ) 0.4 MG SL tablet Place 1 tablet (0.4 mg total) under the tongue every 5 (five) minutes as needed for chest pain. 25 tablet 3   ondansetron  (ZOFRAN ) 8 MG tablet Take 1 tablet (8 mg  total) by mouth every 8 (eight) hours as needed for nausea or vomiting. 30 tablet 2   SUMAtriptan (IMITREX) 50 MG tablet Take 50 mg by mouth every 2 (two) hours as needed for migraine. May repeat in 2 hours if headache persists or recurs.     traMADol  (ULTRAM ) 50 MG tablet Take 1 tablet (50 mg total) by mouth every 12 (twelve) hours as needed. 30 tablet 0   venlafaxine  XR (EFFEXOR -XR) 37.5 MG 24 hr capsule TAKE 2 CAPSULES (75 MG TOTAL) BY MOUTH DAILY. 180 capsule 2   VERZENIO  150 MG tablet TAKE 1 TABLET BY MOUTH 2 TIMES A DAY 56 tablet 3   No current facility-administered medications for this visit.    PHYSICAL EXAMINATION: ECOG PERFORMANCE STATUS: 0 - Asymptomatic   There were no vitals filed for this visit.   Telephone visit   LABORATORY DATA:  I have reviewed the data as listed Lab Results  Component Value Date   WBC 4.7 05/12/2024   HGB 13.6 05/12/2024   HCT 39.9 05/12/2024   MCV 91.9 05/12/2024   PLT 256 05/12/2024   Lab Results  Component Value Date   NA 138  05/12/2024   K 4.2 05/12/2024   CL 104 05/12/2024   CO2 26 05/12/2024    RADIOGRAPHIC STUDIES: I have personally reviewed the radiological reports and agreed with the findings in the report.  ASSESSMENT AND PLAN:   58 y.o. BRCA negative [place] woman    (1) Status post left lumpectomy and sentinel lymph node biopsy October of 2012 for a T2 N1, stage IIB invasive ductal carcinoma, grade 2,  strongly estrogen and progesterone receptor positive, with an MIB-1 of 9% and no HER-2/neu amplification.    (2) Treated initially with docetaxel , doxorubicin , and cyclophosphamide  for one cycle, with very poor tolerance   (3) status post cyclophosphamide / docetaxel , for an additional three cycles completed January 2013   (4)  Status post radiation, completed mid-April 2013   (5)  on tamoxifen  as of April 2013-- switched to anastrozole  March 2018, to be discontinued February 2020             (a) bone density January 2019 shows a T score of -1.7  (6) in May 2025 she had a mammogram and an ultrasound which showed a 2 x 1.7 x 2 cm irregular mass highly suggestive of malignancy.  This showed invasive poorly differentiated adenocarcinoma grade 3, ER 100% positive strong staining PR 60% positive strong staining, Ki-67 of 30% group 5 HER2 negative  (7) we did staging scans given concern for local recurrence which showed right lower lobe lung nodule highly suspicious for metastatic disease versus a second primary.  Hence we proceeded with the PET/CT which showed multiple sites concerning for metastatic disease including mediastinal adenopathy, lung, bone disease.  (8)Bronchoscopy confirmed met breast cancer, ER pos, PR pos, Her 2 1+   (9) She is on verzenio  and letrozole , zometa   PLAN  This is a follow up telephone visit to discuss imaging results. It appears based on the most recent imaging that she has progressive disease with one new lesion at T3. Her tumor marker also appears to be up  trending. We will send for guardant 360 to determine targetable mutations and therapies respectively In the interim she will continue current treatment If guardant 360 is of no yield, then we can pursue additional biopsy. She is agreeable to this plan  Time spent: 10 min  I connected with  Aubrea Vermeer on 06/06/24  by a telephone application and verified that I am speaking with the correct person using two identifiers.   I discussed the limitations of evaluation and management by telemedicine. The patient expressed understanding and agreed to proceed.  Location of pt: Home Location of provider: office.  All questions were answered. The patient knows to call the clinic with any problems, questions or concerns.    Amber Stalls, MD 06/06/24

## 2024-06-09 ENCOUNTER — Inpatient Hospital Stay

## 2024-06-09 ENCOUNTER — Encounter: Payer: Self-pay | Admitting: Hematology and Oncology

## 2024-06-09 DIAGNOSIS — C50412 Malignant neoplasm of upper-outer quadrant of left female breast: Secondary | ICD-10-CM

## 2024-06-09 DIAGNOSIS — C50919 Malignant neoplasm of unspecified site of unspecified female breast: Secondary | ICD-10-CM

## 2024-06-13 ENCOUNTER — Telehealth: Payer: Self-pay | Admitting: *Deleted

## 2024-06-13 ENCOUNTER — Encounter: Payer: Self-pay | Admitting: Hematology and Oncology

## 2024-06-13 NOTE — Telephone Encounter (Signed)
 Patient called to cancel all of her radiation treatments.  She stated her treatment dates were changed and she was not notified.  I asked if we could make changes to her dates would she be willing to proceed with treatments.  She declined stating my pain isn't that bad anyway, I just can't do this.  I let her know that I would make the treating team, MD/PA aware and her appointments would be cancelled.

## 2024-06-17 ENCOUNTER — Telehealth: Payer: Self-pay | Admitting: Radiation Oncology

## 2024-06-17 NOTE — Telephone Encounter (Signed)
 I spoke with the patient and she has decided to forgo radiation for now based on the upcoming holidays and coordination she has to have in order to come back to [place]. She was very upset last week when some of her treatment appts were moved by our staff without clarification or a phone call. I offered to try to get her rescheduled but she is not interested at this time. She will reach back out to my via mychart or by phone if she changes her mind and wants to pursue palliative radiation.

## 2024-06-18 ENCOUNTER — Telehealth: Payer: Self-pay

## 2024-06-18 ENCOUNTER — Telehealth: Payer: Self-pay | Admitting: Pharmacist

## 2024-06-18 ENCOUNTER — Other Ambulatory Visit: Payer: Self-pay

## 2024-06-18 ENCOUNTER — Ambulatory Visit: Admitting: Radiation Oncology

## 2024-06-18 ENCOUNTER — Other Ambulatory Visit (HOSPITAL_COMMUNITY): Payer: Self-pay

## 2024-06-18 ENCOUNTER — Inpatient Hospital Stay: Admitting: Hematology and Oncology

## 2024-06-18 ENCOUNTER — Inpatient Hospital Stay

## 2024-06-18 VITALS — BP 106/68 | HR 84 | Temp 98.3°F | Resp 17 | Wt 134.3 lb

## 2024-06-18 DIAGNOSIS — C7800 Secondary malignant neoplasm of unspecified lung: Secondary | ICD-10-CM

## 2024-06-18 DIAGNOSIS — N644 Mastodynia: Secondary | ICD-10-CM | POA: Diagnosis not present

## 2024-06-18 DIAGNOSIS — Z8042 Family history of malignant neoplasm of prostate: Secondary | ICD-10-CM | POA: Diagnosis not present

## 2024-06-18 DIAGNOSIS — C50919 Malignant neoplasm of unspecified site of unspecified female breast: Secondary | ICD-10-CM | POA: Diagnosis not present

## 2024-06-18 DIAGNOSIS — Z170 Estrogen receptor positive status [ER+]: Secondary | ICD-10-CM | POA: Diagnosis not present

## 2024-06-18 DIAGNOSIS — K5900 Constipation, unspecified: Secondary | ICD-10-CM | POA: Diagnosis not present

## 2024-06-18 DIAGNOSIS — N6489 Other specified disorders of breast: Secondary | ICD-10-CM | POA: Diagnosis not present

## 2024-06-18 DIAGNOSIS — Z79899 Other long term (current) drug therapy: Secondary | ICD-10-CM | POA: Diagnosis not present

## 2024-06-18 DIAGNOSIS — Z1732 Human epidermal growth factor receptor 2 negative status: Secondary | ICD-10-CM | POA: Diagnosis not present

## 2024-06-18 DIAGNOSIS — Z79810 Long term (current) use of selective estrogen receptor modulators (SERMs): Secondary | ICD-10-CM | POA: Diagnosis not present

## 2024-06-18 DIAGNOSIS — Z923 Personal history of irradiation: Secondary | ICD-10-CM | POA: Diagnosis not present

## 2024-06-18 DIAGNOSIS — Z79811 Long term (current) use of aromatase inhibitors: Secondary | ICD-10-CM | POA: Diagnosis not present

## 2024-06-18 DIAGNOSIS — R530 Neoplastic (malignant) related fatigue: Secondary | ICD-10-CM | POA: Diagnosis not present

## 2024-06-18 DIAGNOSIS — E7800 Pure hypercholesterolemia, unspecified: Secondary | ICD-10-CM | POA: Diagnosis not present

## 2024-06-18 DIAGNOSIS — Z8052 Family history of malignant neoplasm of bladder: Secondary | ICD-10-CM | POA: Diagnosis not present

## 2024-06-18 DIAGNOSIS — Z9010 Acquired absence of unspecified breast and nipple: Secondary | ICD-10-CM | POA: Diagnosis not present

## 2024-06-18 DIAGNOSIS — Z1721 Progesterone receptor positive status: Secondary | ICD-10-CM | POA: Diagnosis not present

## 2024-06-18 DIAGNOSIS — Z833 Family history of diabetes mellitus: Secondary | ICD-10-CM | POA: Diagnosis not present

## 2024-06-18 DIAGNOSIS — C50412 Malignant neoplasm of upper-outer quadrant of left female breast: Secondary | ICD-10-CM

## 2024-06-18 LAB — CMP (CANCER CENTER ONLY)
ALT: 9 U/L (ref 0–44)
AST: 18 U/L (ref 15–41)
Albumin: 4.3 g/dL (ref 3.5–5.0)
Alkaline Phosphatase: 42 U/L (ref 38–126)
Anion gap: 8 (ref 5–15)
BUN: 15 mg/dL (ref 6–20)
CO2: 28 mmol/L (ref 22–32)
Calcium: 9.2 mg/dL (ref 8.9–10.3)
Chloride: 104 mmol/L (ref 98–111)
Creatinine: 0.83 mg/dL (ref 0.44–1.00)
GFR, Estimated: >60 mL/min (ref >60)
Glucose, Bld: 91 mg/dL (ref 70–99)
Potassium: 4 mmol/L (ref 3.5–5.1)
Sodium: 141 mmol/L (ref 135–145)
Total Bilirubin: 0.3 mg/dL (ref 0.0–1.2)
Total Protein: 7 g/dL (ref 6.5–8.1)

## 2024-06-18 LAB — CBC WITH DIFFERENTIAL (CANCER CENTER ONLY)
Abs Immature Granulocytes: 0.01 K/uL (ref 0.00–0.07)
Basophils Absolute: 0.1 K/uL (ref 0.0–0.1)
Basophils Relative: 1 %
Eosinophils Absolute: 0.1 K/uL (ref 0.0–0.5)
Eosinophils Relative: 1 %
HCT: 37.6 % (ref 36.0–46.0)
Hemoglobin: 13 g/dL (ref 12.0–15.0)
Immature Granulocytes: 0 %
Lymphocytes Relative: 20 %
Lymphs Abs: 1.4 K/uL (ref 0.7–4.0)
MCH: 32.7 pg (ref 26.0–34.0)
MCHC: 34.6 g/dL (ref 30.0–36.0)
MCV: 94.5 fL (ref 80.0–100.0)
Monocytes Absolute: 0.4 K/uL (ref 0.1–1.0)
Monocytes Relative: 6 %
Neutro Abs: 5 K/uL (ref 1.7–7.7)
Neutrophils Relative %: 72 %
Platelet Count: 248 K/uL (ref 150–400)
RBC: 3.98 MIL/uL (ref 3.87–5.11)
RDW: 13.2 % (ref 11.5–15.5)
WBC Count: 6.9 K/uL (ref 4.0–10.5)
nRBC: 0 % (ref 0.0–0.2)

## 2024-06-18 MED ORDER — INAVOLISIB 9 MG PO TABS
9.0000 mg | ORAL_TABLET | Freq: Every day | ORAL | 1 refills | Status: DC
  Filled 2024-06-18: qty 28, 28d supply, fill #0

## 2024-06-18 MED ORDER — PALBOCICLIB 125 MG PO TABS: 125.0000 mg | ORAL_TABLET | Freq: Every day | ORAL | 1 refills | Status: DC

## 2024-06-18 NOTE — Telephone Encounter (Signed)
 Oral Oncology Patient Advocate Encounter   Received notification that prior authorization for ITOVEBI  is required.   PA submitted on 06/18/2024 Key [id number] Status is pending      Charlott Hamilton,  CPhT-Adv  she/her/hers [facility]  [facility] Pharmacy Technician Patient Advocate Specialist III [facility] Phone: [phone]  Fax: [phone] [email or web address]

## 2024-06-18 NOTE — Telephone Encounter (Signed)
 Oral Oncology Patient Advocate Encounter   Received notification that prior authorization for IBRANCE  is required.   PA submitted on 06/18/2024 Key [id number] Status is pending  Patient must fill prescription through [facility]      Charlott Hamilton,  CPhT-Adv  she/her/hers [place]   Specialty Pharmacy Services Pharmacy Technician Patient Advocate Specialist III [facility] Phone: [phone]  Fax: [phone] [email or web address]

## 2024-06-18 NOTE — Progress Notes (Signed)
 [facility] CONSULT NOTE  Patient Care Team: Dyane Anthony RAMAN, FNP as PCP - General (Family Medicine) Jason Charleston, MD (Inactive) as Consulting Physician (Radiation Oncology) Loretha Ash, MD as Consulting Physician (Hematology and Oncology) Tyree Nanetta SAILOR, RN as Oncology Nurse Navigator Ebbie Cough, MD as Consulting Physician (General Surgery) Dewey Rush, MD as Consulting Physician (Radiation Oncology)  CHIEF COMPLAINTS/PURPOSE OF CONSULTATION:  Newly diagnosed breast cancer  HISTORY OF PRESENTING ILLNESS:  Erin Castillo 58 y.o. female is here because of recent diagnosis of left breast cancer  I reviewed her records extensively and collaborated the history with the patient.  SUMMARY OF ONCOLOGIC HISTORY: Oncology History  Malignant neoplasm of upper-outer quadrant of left breast in female, estrogen receptor positive (HCC)  04/26/2011 Initial Diagnosis   Malignant neoplasm of upper-outer quadrant of left breast in female, estrogen receptor positive (HCC)   11/30/2023 Mammogram   Focal asymmetry with calcifications in the left breast is indeterminate. Additional views with US  is recommended. US  in the breast   12/11/2023 Pathology Results   Left breast needle core biopsy showed invasive poorly differentiated adenocarcinoma grade 3 prognostic showed ER 100% positive strong staining PR 60% positive strong staining Ki-67 of 30% and group 5 HER2 negative    Genetic Testing   [facility] CancerNext-Expanded Panel+RNA was Negative. Report date is 01/06/2024.   The CancerNext-Expanded gene panel offered by [facility] and includes sequencing, rearrangement, and RNA analysis for the following 77 genes: AIP, ALK, APC, ATM, AXIN2, BAP1, BARD1, BMPR1A, BRCA1, BRCA2, BRIP1, CDC73, CDH1, CDK4, CDKN1B, CDKN2A, CEBPA, CHEK2, CTNNA1, DDX41, DICER1, ETV6, FH, FLCN, GATA2, LZTR1, MAX, MBD4, MEN1, MET, MLH1, MSH2, MSH3, MSH6, MUTYH, NF1, NF2, NTHL1, PALB2, PHOX2B, PMS2, POT1,  PRKAR1A, PTCH1, PTEN, RAD51C, RAD51D, RB1, RET, RPS20, RUNX1, SDHA, SDHAF2, SDHB, SDHC, SDHD, SMAD4, SMARCA4, SMARCB1, SMARCE1, STK11, SUFU, TMEM127, TP53, TSC1, TSC2, VHL, and WT1 (sequencing and deletion/duplication); EGFR, HOXB13, KIT, MITF, PDGFRA, POLD1, and POLE (sequencing only); EPCAM and GREM1 (deletion/duplication only).      Discussed the use of AI scribe software for clinical note transcription with the patient, who gave verbal consent to proceed.  History of Present Illness  Erin Castillo is a 58 year old female with breast cancer who presents with worsening symptoms and treatment evaluation.  Over the past week to week and a half, she feels 'horrible' with worsening symptoms. She experienced severe constipation followed by diarrhea after taking a stool softener and continues to have significant stomach pain, which kept her up almost all night. She also reports severe fatigue, which is worse than last year, and feels 'way off'.  She notes a new symptom of right breast pain and thought she felt something abnormal, but is unsure now. She also reports more breathing issues and shortness of breath.  Her current medications include Verzenio  50 mg and Letrozole . She questions why she has not felt this bad before and reports that her symptoms have been worse over the last month or two. Rest of the pertinent 10 point ROS reviewed and negative  MEDICAL HISTORY:  Past Medical History:  Diagnosis Date   Achilles tendonitis    Anxiety    Breast cancer (HCC) 04/20/2011   Left, ER/PR+   Chest pain    hx - no current problem   Depression    Dyspareunia in female    GAD (generalized anxiety disorder)    Heel spur, left    History of radiation therapy    09/18/11 to 11/03/11, left breast  Hypercholesterolemia    Hypotension    Insomnia    occasional   Migraines    Osteopenia    Postmenopausal atrophic vaginitis    Pulmonary nodule 12/2023   right lower lobe   Ulcerative colitis  [facility])     SURGICAL HISTORY: Past Surgical History:  Procedure Laterality Date   BREAST LUMPECTOMY  05/15/11   left breast lumpectomy    CESAREAN SECTION  2000   ENDOBRONCHIAL ULTRASOUND  01/15/2024   Procedure: ENDOBRONCHIAL ULTRASOUND (EBUS);  Surgeon: Shelah Lamar RAMAN, MD;  Location: [facility] ENDOSCOPY;  Service: Pulmonary;;   MASTECTOMY PARTIAL / LUMPECTOMY W/ AXILLARY LYMPHADENECTOMY  10/12   snbx   NASAL SINUS SURGERY  1991   PORT-A-CATH REMOVAL  12/12/2011   Procedure: REMOVAL PORT-A-CATH;  Surgeon: Morene ONEIDA Olives, MD;  Location: [facility];  Service: General;  Laterality: Right;   PORTACATH PLACEMENT  06/12/2011   Procedure: INSERTION PORT-A-CATH;  Surgeon: Morene ONEIDA Olives, MD;  Location: [facility];  Service: General;  Laterality: Right;  Right Subclavian Vein   SINUS EXPLORATION     remote   VIDEO BRONCHOSCOPY WITH ENDOBRONCHIAL NAVIGATION Right 01/15/2024   Procedure: VIDEO BRONCHOSCOPY WITH ENDOBRONCHIAL NAVIGATION;  Surgeon: Shelah Lamar RAMAN, MD;  Location[facility] ENDOSCOPY;  Service: Pulmonary;  Laterality: Right;    SOCIAL HISTORY: Social History   Socioeconomic History   Marital status: Married    Spouse name: Not on file   Number of children: Not on file   Years of education: Not on file   Highest education level: Not on file  Occupational History   Occupation: print production planner  Tobacco Use   Smoking status: Never    Passive exposure: Never   Smokeless tobacco: Never  Vaping Use   Vaping status: Never Used  Substance and Sexual Activity   Alcohol use: Yes    Alcohol/week: 2.0 - 3.0 standard drinks of alcohol    Types: 2 - 3 Glasses of wine per week   Drug use: No   Sexual activity: Yes    Birth control/protection: Post-menopausal  Other Topics Concern   Not on file  Social History Narrative   3 children   Social Drivers of Corporate Investment Banker Strain: Not on file  Food Insecurity: No Food Insecurity (04/23/2024)   Hunger Vital Sign     Worried About Running Out of Food in the Last Year: Never true    Ran Out of Food in the Last Year: Never true  Transportation Needs: No Transportation Needs (04/23/2024)   PRAPARE - Administrator, Civil Service (Medical): No    Lack of Transportation (Non-Medical): No  Physical Activity: Not on file  Stress: Not on file  Social Connections: Not on file  Intimate Partner Violence: Not At Risk (04/23/2024)   Humiliation, Afraid, Rape, and Kick questionnaire    Fear of Current or Ex-Partner: No    Emotionally Abused: No    Physically Abused: No    Sexually Abused: No    FAMILY HISTORY: Family History  Problem Relation Age of Onset   Bladder Cancer Father 31   Diabetes Father    Prostate cancer Paternal Grandfather     ALLERGIES:  has no known allergies.  MEDICATIONS:  Current Outpatient Medications  Medication Sig Dispense Refill   b complex vitamins tablet Take 1 tablet by mouth daily.      Cholecalciferol (VITAMIN D3) 25 MCG (1000 UT) CAPS 1 capsule.     clobetasol cream (TEMOVATE)  0.05 % Apply 1 Application topically.     letrozole  (FEMARA ) 2.5 MG tablet Take 1 tablet (2.5 mg total) by mouth daily. 90 tablet 3   melatonin 5 MG TABS Take 5 mg by mouth.     mesalamine (LIALDA) 1.2 G EC tablet Take 1,200 mg by mouth daily with breakfast.     Multiple Vitamin (MULITIVITAMIN WITH MINERALS) TABS Take 1 tablet by mouth daily.     nitroGLYCERIN  (NITROSTAT ) 0.4 MG SL tablet Place 1 tablet (0.4 mg total) under the tongue every 5 (five) minutes as needed for chest pain. 25 tablet 3   ondansetron  (ZOFRAN ) 8 MG tablet Take 1 tablet (8 mg total) by mouth every 8 (eight) hours as needed for nausea or vomiting. 30 tablet 2   SUMAtriptan (IMITREX) 50 MG tablet Take 50 mg by mouth every 2 (two) hours as needed for migraine. May repeat in 2 hours if headache persists or recurs.     traMADol  (ULTRAM ) 50 MG tablet Take 1 tablet (50 mg total) by mouth every 12 (twelve) hours as needed.  30 tablet 0   venlafaxine  XR (EFFEXOR -XR) 37.5 MG 24 hr capsule TAKE 2 CAPSULES (75 MG TOTAL) BY MOUTH DAILY. 180 capsule 2   VERZENIO  150 MG tablet TAKE 1 TABLET BY MOUTH 2 TIMES A DAY 56 tablet 3   No current facility-administered medications for this visit.    PHYSICAL EXAMINATION: ECOG PERFORMANCE STATUS: 0 - Asymptomatic   Filed Weights   06/18/24 1439  Weight: 134 lb 4.8 oz (60.9 kg)    No palpable breast mass or regional adenopathy CTA bilaterally RRR No LE edema.   LABORATORY DATA:  I have reviewed the data as listed Lab Results  Component Value Date   WBC 6.9 06/18/2024   HGB 13.0 06/18/2024   HCT 37.6 06/18/2024   MCV 94.5 06/18/2024   PLT 248 06/18/2024   Lab Results  Component Value Date   NA 141 06/18/2024   K 4.0 06/18/2024   CL 104 06/18/2024   CO2 28 06/18/2024    RADIOGRAPHIC STUDIES: I have personally reviewed the radiological reports and agreed with the findings in the report.  ASSESSMENT AND PLAN:   58 y.o. BRCA negative [place] woman    (1) Status post left lumpectomy and sentinel lymph node biopsy October of 2012 for a T2 N1, stage IIB invasive ductal carcinoma, grade 2,  strongly estrogen and progesterone receptor positive, with an MIB-1 of 9% and no HER-2/neu amplification.    (2) Treated initially with docetaxel , doxorubicin , and cyclophosphamide  for one cycle, with very poor tolerance   (3) status post cyclophosphamide / docetaxel , for an additional three cycles completed January 2013   (4)  Status post radiation, completed mid-April 2013   (5)  on tamoxifen  as of April 2013-- switched to anastrozole  March 2018, to be discontinued February 2020             (a) bone density January 2019 shows a T score of -1.7  (6) in May 2025 she had a mammogram and an ultrasound which showed a 2 x 1.7 x 2 cm irregular mass highly suggestive of malignancy.  This showed invasive poorly differentiated adenocarcinoma grade 3, ER 100% positive strong  staining PR 60% positive strong staining, Ki-67 of 30% group 5 HER2 negative  (7) we did staging scans given concern for local recurrence which showed right lower lobe lung nodule highly suspicious for metastatic disease versus a second primary.  Hence we proceeded with the PET/CT  which showed multiple sites concerning for metastatic disease including mediastinal adenopathy, lung, bone disease.  (8)Bronchoscopy confirmed met breast cancer, ER pos, PR pos, Her 2 1+   (9) She is on verzenio  and letrozole , zometa   PLAN  Assessment and Plan Assessment & Plan Endocrine refractory metastatic breast cancer, PIK3 mutated, ER positive Endocrine resistance confirmed after Verzenio  failure. PIK3CA mutation present.  - Submitted request for inavalosib and palbociclib  combination therapy with faslodex, pending insurance approval. - If inavalosib not approved, initiate Trucap and Faslodex combination therapy. - Arrange Faslodex administration at clinic. - Repeat mutation testing if disease progresses.  Cancer-related fatigue Severe fatigue likely due to cancer and treatment.  Abdominal pain and altered bowel habits (constipation and diarrhea) Alternating constipation and diarrhea with abdominal pain, likely medication side effects.  Shortness of breath Increased shortness of breath, possibly due to extreme fatigue from treatment. No findings on exam today  All questions were answered. The patient knows to call the clinic with any problems, questions or concerns.    Amber Stalls, MD 06/18/24

## 2024-06-18 NOTE — Telephone Encounter (Signed)
 I left voicemail for patient regarding schedule pharmacy clinic visit for 06/25/2024 at 2:00 pm. I advised patient to return my call if date/time does not work for her.

## 2024-06-19 LAB — CANCER ANTIGEN 15-3: CA 15-3: 127 U/mL — ABNORMAL HIGH (ref 0.0–25.0)

## 2024-06-19 LAB — CANCER ANTIGEN 27.29: CA 27.29: 175.2 U/mL — ABNORMAL HIGH (ref 0.0–38.6)

## 2024-06-20 ENCOUNTER — Other Ambulatory Visit: Payer: Self-pay

## 2024-06-20 ENCOUNTER — Other Ambulatory Visit (HOSPITAL_COMMUNITY): Payer: Self-pay

## 2024-06-21 ENCOUNTER — Other Ambulatory Visit (HOSPITAL_COMMUNITY): Payer: Self-pay

## 2024-06-23 ENCOUNTER — Telehealth: Payer: Self-pay | Admitting: Pharmacist

## 2024-06-23 ENCOUNTER — Other Ambulatory Visit (HOSPITAL_COMMUNITY): Payer: Self-pay

## 2024-06-23 ENCOUNTER — Other Ambulatory Visit: Payer: Self-pay | Admitting: Pharmacist

## 2024-06-23 DIAGNOSIS — R739 Hyperglycemia, unspecified: Secondary | ICD-10-CM

## 2024-06-23 NOTE — Telephone Encounter (Signed)
 Oral Oncology Patient Advocate Encounter  Prior Authorization for  IBRANCE   has been approved.    PA# [id number] Effective dates: 06/20/2024 through 06/20/2025  Patient must fill prescription through [facility]    Charlott Hamilton,  CPhT-Adv  she/her/hers [place]  [facility] [place] [facility] Pharmacy Technician Patient Advocate Specialist III [facility] Phone: [phone]  Fax: [phone] [email or web address] .

## 2024-06-23 NOTE — Telephone Encounter (Signed)
 [facility]        Telephone: [phone]?Fax: [phone]   Oncology Clinical Pharmacist Practitioner Encounter   Received new prescription for inavolisib  and palbociclib  for the treatment of breast cancer. This is being given in combination with fulvestrant. It is planned to continue until disease progression or unacceptable toxicity.  Labs from 06/18/24 assessed. Prescription dose and frequency assessed.   Current medication list in Epic reviewed. Significant DDIs with inavolisib  + palbociclib  identified:No.  Evaluated chart. Patient barriers to medication adherence identified: No.  Patient agreement for treatment documented in physician note on 06/18/24.  Prescription has been e-scribed to the [facility]) for benefits analysis and approval.  Oral Oncology Clinic will continue to follow for insurance authorization, copayment issues, initial counseling and start date.  She will see clinical pharmacy on 06/25/24 prior to her first fulvestrant injection.  Caster Fayette A. Lucila, PharmD, BCOP, CPP Hematology-Oncology Clinical Pharmacist Practitioner  06/23/2024 10:29 AM  **Disclaimer: This note was dictated with voice recognition software. Similar sounding words can inadvertently be transcribed and this note may contain transcription errors which may not have been corrected upon publication of note.**

## 2024-06-23 NOTE — Telephone Encounter (Signed)
 I spoke with patient to let her know that she needs a lab appointment before pharmacy clinic visit on 06/25/2024. Patient aware of date/time.

## 2024-06-23 NOTE — Telephone Encounter (Signed)
 Oral Oncology Patient Advocate Encounter  Received notification that the request for prior authorization for ITOVEBI   has been denied due to:    Charlott Hamilton,  CPhT-Adv  she/her/hers [facility]  [facility] Pharmacy Technician Patient Advocate Specialist III [facility] Phone: [phone]  Fax: [phone] [email or web address]

## 2024-06-24 ENCOUNTER — Ambulatory Visit: Admitting: Radiation Oncology

## 2024-06-24 ENCOUNTER — Encounter: Payer: Self-pay | Admitting: Hematology and Oncology

## 2024-06-24 NOTE — Progress Notes (Unsigned)
 [facility]        Telephone: [phone]?Fax: [phone]   Oncology Clinical Pharmacist Practitioner Initial Assessment   Erin Castillo is a 58 y.o. female with a diagnosis of metastatic breast cancer. They were contacted today via in-person visit.  Indication/Regimen Inavolisib  (Itovebi ) + palbociclib  (Ibrance ) + fulvestrant (Faslodex) is being used appropriately for treatment of metastatic breast cancer by Dr. Amber Stalls.      Wt Readings from Last 1 Encounters:  06/18/24 134 lb 4.8 oz (60.9 kg)    Estimated body surface area is 1.66 meters squared as calculated from the following:   Height as of 05/02/24: 5' 4 (1.626 m).   Weight as of 06/18/24: 134 lb 4.8 oz (60.9 kg).  The dosing regimen is inavolisib  9 mg by mouth daily on days 1 to 28 of a 28-day cycle, palbociclib  125 mg by mouth daily on days 1 to 21 of a 28-day cycle, and fulvestrant 500 mg IM every 2 weeks x 3 doses, then every 4 weeks thereafter.   It is planned to continue until disease progression or unacceptable toxicity. Prescription dose and frequency assessed for appropriateness. The treatment goal is: Control.  Patient has agreed to treatment which is documented in physician note on 06/18/24. Counseled patient on administration, dosing, side effects, monitoring, drug-food interactions, safe handling, storage, and disposal.  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 Dr. Stalls aware Start date if known: TBD  Adherence Assessment Reviewed importance on keeping a med schedule and plan for any missed doses Barriers to adherence identified? No  Communication and Learning Assessment Primary learner: Patient Barriers to learning: No  barriers Preferred language: English Learning preferences: Listening   Allergies No Known Allergies  Vitals    06/18/2024    2:39 PM 05/12/2024    1:55 PM 05/02/2024    9:14 AM  Oncology Vitals  Height   163 cm  Weight 60.918 kg  62.097 kg  Weight (lbs) 134 lbs 5 oz  136 lbs 14 oz  BMI 23.05 kg/m2  23.5 kg/m2  Temp 98.3 F (36.8 C) 98 F (36.7 C) 97.7 F (36.5 C)  Pulse Rate 84 86 82  BP 106/68 112/81 124/82  Resp 17 16 18   SpO2 100 % 100 % 100 %  BSA (m2) 1.66 m2  1.67 m2     Laboratory Data    Latest Ref Rng & Units 06/18/2024   12:54 PM 05/12/2024   12:56 PM 04/15/2024    3:03 PM  CBC EXTENDED  WBC 4.0 - 10.5 K/uL 6.9  4.7  4.1   RBC 3.87 - 5.11 MIL/uL 3.98  4.34  4.41   Hemoglobin 12.0 - 15.0 g/dL 86.9  86.3  86.7   HCT 36.0 - 46.0 % 37.6  39.9  39.0   Platelets 150 - 400 K/uL 248  256  356   NEUT# 1.7 - 7.7 K/uL 5.0  2.8  2.4   Lymph# 0.7 - 4.0 K/uL 1.4  1.3  1.1        Latest Ref Rng & Units 06/18/2024   12:54 PM 05/12/2024   12:56 PM 04/15/2024    3:03 PM  CMP  Glucose 70 - 99 mg/dL 91  80  98   BUN 6 - 20 mg/dL 15  17  19   Creatinine 0.44 - 1.00 mg/dL 9.16  9.11  9.07   Sodium 135 - 145 mmol/L 141  138  139   Potassium 3.5 - 5.1 mmol/L 4.0  4.2  4.4   Chloride 98 - 111 mmol/L 104  104  105   CO2 22 - 32 mmol/L 28  26  30    Calcium 8.9 - 10.3 mg/dL 9.2  9.7  9.7   Total Protein 6.5 - 8.1 g/dL 7.0  7.3  7.3   Total Bilirubin 0.0 - 1.2 mg/dL 0.3  0.3  0.4   Alkaline Phos 38 - 126 U/L 42  37  43   AST 15 - 41 U/L 18  17  18    ALT 0 - 44 U/L 9  10  17     No results found for: MG Lab Results  Component Value Date   CA2729 175.2 (H) 06/18/2024   CA2729 125.8 (H) 05/12/2024   CA2729 94.9 (H) 04/15/2024     Contraindications Contraindications were reviewed? Yes Contraindications to therapy were identified? No   Safety Precautions The following safety precautions for the use of inavolisib , palbociclib , and fulvestrant were reviewed:  Fever:  reviewed the importance of having a thermometer and the Centers for Disease Control and Prevention (CDC) definition of fever which is 100.18F (38C) or higher. Patient should call 24/7 triage at [phone] if experiencing a fever or any other symptoms Decreased white blood cells (WBCs) and increased risk for infection Decreased hemoglobin, part of the red blood cells that carry iron and oxygen Decreased platelet count and increased risk for bleeding Changes in liver function Fatigue Nausea or vomiting Hair loss (alopecia) Mouth irritation or sores Pneumonitis / ILD Handling body fluids and wastes Storage and handling Missed doses Avoid grapefruit products Hyperglycemia Diarrhea Rash  Inavolisib  Monitoring Parameters CBC at baseline and as clinically indicated Fasting glucose level at baseline, once every 3 days for the first week (days 1 to 7), then weekly for 3 weeks (days 8 to 28), then every 2 weeks for the next 8 weeks, then once every 4 weeks thereafter, and as clinically indicated.  HbA1C at baseline and every 3 months during treatment, and as clinically indicated. Monitor for diarrhea, hematologic toxicities, hyperglycemia, and stomatitis   Medication Reconciliation Current Outpatient Medications  Medication Sig Dispense Refill   b complex vitamins tablet Take 1 tablet by mouth daily.      Cholecalciferol (VITAMIN D3) 25 MCG (1000 UT) CAPS 1 capsule.     clobetasol cream (TEMOVATE) 0.05 % Apply 1 Application topically.     Inavolisib  (ITOVEBI ) 9 MG TABS tablet Take 1 tablet (9 mg total) by mouth daily. 28 tablet 1   melatonin 5 MG TABS Take 5 mg by mouth.     mesalamine (LIALDA) 1.2 G EC tablet Take 1,200 mg by mouth daily with breakfast.     Multiple Vitamin (MULITIVITAMIN WITH MINERALS) TABS Take 1 tablet by mouth daily.     nitroGLYCERIN  (NITROSTAT ) 0.4 MG SL tablet Place 1 tablet (0.4 mg total) under the tongue every 5 (five) minutes as needed for chest pain. 25  tablet 3   ondansetron  (ZOFRAN ) 8 MG tablet Take 1 tablet (8 mg total) by mouth every 8 (eight) hours as needed for nausea or vomiting. 30 tablet 2   palbociclib  (IBRANCE ) 125 MG tablet Take 1 tablet (125 mg total) by mouth daily. Take for 21 days on, 7 days off, repeat every 28 days. 21 tablet 1  SUMAtriptan (IMITREX) 50 MG tablet Take 50 mg by mouth every 2 (two) hours as needed for migraine. May repeat in 2 hours if headache persists or recurs.     traMADol  (ULTRAM ) 50 MG tablet Take 1 tablet (50 mg total) by mouth every 12 (twelve) hours as needed. 30 tablet 0   venlafaxine  XR (EFFEXOR -XR) 37.5 MG 24 hr capsule TAKE 2 CAPSULES (75 MG TOTAL) BY MOUTH DAILY. 180 capsule 2   No current facility-administered medications for this visit.    Medication reconciliation is based on the patient's most recent medication list in the electronic medical record (EMR) including herbal products and OTC medications.   The patient's medication list was reviewed today with the patient? Yes   Drug-drug interactions (DDIs) DDIs were evaluated? Yes Significant DDIs identified? Patient has been on tramadol  and venlafaxine  with no issues. Discussed serotonin syndrome in past and again today with Ms. Amrein  Drug-Food Interactions Drug-food interactions were evaluated? Yes Drug-food interactions identified? Avoid grapefruit products  Follow-up Plan  Patient education handout given to patient Start inavolisib  9 mg by mouth daily -- insurance needs PIK3CA mutation information. Dr. Loretha aware Start palbociclib  125 mg by mouth on days 1 to 21, followed by a 7-day rest peroid Start fulvestrant 500 mg IM every 2 weeks for 3 doses, followed by every 28 days thereafter. Will start today, dose 2 (07/09/24), dose 3 (07/23/24), then every 28 days Continue zoledronic  acid 4 mg IV every 12 weeks, last given on 05/12/24, next due on 08/04/24 Dexamethasone  mouthwash prescription sent to local pharmacy of choice Monitor for  side effects and hold for Grade 2 toxicities or fasting blood glucose > 160 mg/dL.  Discussed stomatitis prevention and checking blood sugars at home once every 3 days for the first week (days 1 to 7), then weekly for 3 weeks (days 8 to 28), then every 2 weeks for the next 8 weeks, then once every 4 weeks thereafter. Will check fasting blood glucose in clinic as well. Distress thermometer not done as patient has had prior treatment at [facility] Will add fasting labs, Dr. Loretha visit, and dose #2 fulvestrant in 2 weeks *** Neelie Tobey can follow up with clinical pharmacy as deemed necessary by Dr. Amber Iruku going forward   Corynne Schweikert participated in the discussion, expressed understanding, and voiced agreement with the above plan. All questions were answered to their satisfaction. The patient was advised to contact the clinic at [phone] with any questions or concerns prior to their return visit.   I spent *** minutes assessing the patient.  Chantia Amalfitano A. Lucila, PharmD, BCOP, CPP  Norleen DELENA Lucila, RPH-CPP, 06/24/2024 8:28 AM  **Disclaimer: This note was dictated with voice recognition software. Similar sounding words can inadvertently be transcribed and this note may contain transcription errors which may not have been corrected upon publication of note.**

## 2024-06-25 ENCOUNTER — Inpatient Hospital Stay

## 2024-06-25 ENCOUNTER — Other Ambulatory Visit (HOSPITAL_COMMUNITY): Payer: Self-pay

## 2024-06-25 ENCOUNTER — Inpatient Hospital Stay: Attending: Hematology and Oncology | Admitting: Pharmacist

## 2024-06-25 ENCOUNTER — Telehealth: Payer: Self-pay | Admitting: Hematology and Oncology

## 2024-06-25 ENCOUNTER — Ambulatory Visit

## 2024-06-25 ENCOUNTER — Ambulatory Visit: Admitting: Radiation Oncology

## 2024-06-25 VITALS — BP 109/70 | HR 81 | Temp 98.0°F | Resp 18 | Ht 64.0 in | Wt 132.6 lb

## 2024-06-25 DIAGNOSIS — Z79899 Other long term (current) drug therapy: Secondary | ICD-10-CM | POA: Diagnosis not present

## 2024-06-25 DIAGNOSIS — C50412 Malignant neoplasm of upper-outer quadrant of left female breast: Secondary | ICD-10-CM | POA: Diagnosis present

## 2024-06-25 DIAGNOSIS — C50919 Malignant neoplasm of unspecified site of unspecified female breast: Secondary | ICD-10-CM

## 2024-06-25 DIAGNOSIS — R739 Hyperglycemia, unspecified: Secondary | ICD-10-CM

## 2024-06-25 DIAGNOSIS — Z5111 Encounter for antineoplastic chemotherapy: Secondary | ICD-10-CM | POA: Insufficient documentation

## 2024-06-25 DIAGNOSIS — Z170 Estrogen receptor positive status [ER+]: Secondary | ICD-10-CM | POA: Diagnosis not present

## 2024-06-25 LAB — CMP (CANCER CENTER ONLY)
ALT: 11 U/L (ref 0–44)
AST: 19 U/L (ref 15–41)
Albumin: 4.5 g/dL (ref 3.5–5.0)
Alkaline Phosphatase: 43 U/L (ref 38–126)
Anion gap: 9 (ref 5–15)
BUN: 17 mg/dL (ref 6–20)
CO2: 30 mmol/L (ref 22–32)
Calcium: 9.2 mg/dL (ref 8.9–10.3)
Chloride: 103 mmol/L (ref 98–111)
Creatinine: 1.02 mg/dL — ABNORMAL HIGH (ref 0.44–1.00)
GFR, Estimated: >60 mL/min (ref >60)
Glucose, Bld: 108 mg/dL — ABNORMAL HIGH (ref 70–99)
Potassium: 3.8 mmol/L (ref 3.5–5.1)
Sodium: 141 mmol/L (ref 135–145)
Total Bilirubin: 0.3 mg/dL (ref 0.0–1.2)
Total Protein: 7.3 g/dL (ref 6.5–8.1)

## 2024-06-25 LAB — CBC WITH DIFFERENTIAL (CANCER CENTER ONLY)
Abs Immature Granulocytes: 0.02 K/uL (ref 0.00–0.07)
Basophils Absolute: 0.1 K/uL (ref 0.0–0.1)
Basophils Relative: 1 %
Eosinophils Absolute: 0.1 K/uL (ref 0.0–0.5)
Eosinophils Relative: 1 %
HCT: 38 % (ref 36.0–46.0)
Hemoglobin: 12.9 g/dL (ref 12.0–15.0)
Immature Granulocytes: 0 %
Lymphocytes Relative: 26 %
Lymphs Abs: 1.4 K/uL (ref 0.7–4.0)
MCH: 32.4 pg (ref 26.0–34.0)
MCHC: 33.9 g/dL (ref 30.0–36.0)
MCV: 95.5 fL (ref 80.0–100.0)
Monocytes Absolute: 0.4 K/uL (ref 0.1–1.0)
Monocytes Relative: 8 %
Neutro Abs: 3.4 K/uL (ref 1.7–7.7)
Neutrophils Relative %: 64 %
Platelet Count: 237 K/uL (ref 150–400)
RBC: 3.98 MIL/uL (ref 3.87–5.11)
RDW: 13.1 % (ref 11.5–15.5)
WBC Count: 5.4 K/uL (ref 4.0–10.5)
nRBC: 0 % (ref 0.0–0.2)

## 2024-06-25 MED ORDER — LANCET DEVICE MISC
0 refills | Status: AC
  Filled 2024-06-25: qty 1, fill #0

## 2024-06-25 MED ORDER — BLOOD GLUCOSE TEST VI STRP
ORAL_STRIP | 0 refills | Status: AC
  Filled 2024-06-25: qty 50, 50d supply, fill #0

## 2024-06-25 MED ORDER — ACCU-CHEK SOFTCLIX LANCETS MISC
0 refills | Status: AC
  Filled 2024-06-25: qty 100, 90d supply, fill #0

## 2024-06-25 MED ORDER — ACCU-CHEK GUIDE ME W/DEVICE KIT
PACK | 0 refills | Status: AC
  Filled 2024-06-25: qty 1, 30d supply, fill #0

## 2024-06-25 MED ORDER — DEXAMETHASONE 0.5 MG/5ML PO ELIX
ORAL_SOLUTION | ORAL | 4 refills | Status: AC
  Filled 2024-06-25: qty 474, 12d supply, fill #0

## 2024-06-25 MED ORDER — FULVESTRANT 250 MG/5ML IM SOSY
500.0000 mg | PREFILLED_SYRINGE | Freq: Once | INTRAMUSCULAR | Status: AC
  Administered 2024-06-25: 500 mg via INTRAMUSCULAR
  Filled 2024-06-25: qty 10

## 2024-06-25 NOTE — Telephone Encounter (Signed)
 I left voicemail for patient to return my call if lab/MD/injection appointments on 07/07/2024 do not work for her. Patient also needs Zometa  scheduled.

## 2024-06-26 ENCOUNTER — Other Ambulatory Visit: Payer: Self-pay

## 2024-06-26 ENCOUNTER — Telehealth: Payer: Self-pay | Admitting: Pharmacist

## 2024-06-26 ENCOUNTER — Ambulatory Visit

## 2024-06-26 ENCOUNTER — Other Ambulatory Visit (HOSPITAL_COMMUNITY): Payer: Self-pay

## 2024-06-26 LAB — GUARDANT 360

## 2024-06-26 LAB — HEMOGLOBIN A1C
Hgb A1c MFr Bld: 4.8 % (ref 4.8–5.6)
Mean Plasma Glucose: 91 mg/dL

## 2024-06-26 NOTE — Telephone Encounter (Signed)
 I spoke with patient and she would like to wait to schedule Zometa  when she comes in on 07/22/2024.

## 2024-06-27 ENCOUNTER — Ambulatory Visit

## 2024-06-30 ENCOUNTER — Ambulatory Visit

## 2024-06-30 ENCOUNTER — Encounter: Payer: Self-pay | Admitting: Pharmacist

## 2024-06-30 ENCOUNTER — Other Ambulatory Visit: Payer: Self-pay | Admitting: Pharmacist

## 2024-06-30 MED ORDER — INAVOLISIB 9 MG PO TABS: 9.0000 mg | ORAL_TABLET | Freq: Every day | ORAL | 0 refills | Status: DC

## 2024-07-01 ENCOUNTER — Other Ambulatory Visit: Payer: Self-pay | Admitting: Hematology and Oncology

## 2024-07-01 ENCOUNTER — Encounter: Payer: Self-pay | Admitting: Hematology and Oncology

## 2024-07-01 ENCOUNTER — Ambulatory Visit

## 2024-07-01 NOTE — Telephone Encounter (Signed)
 Oral Oncology Patient Advocate Encounter  Prior Authorization for Itovebi  has been approved.    Effective dates: 06/28/2024 through 12/06/82026  Patient [facility]  Oral Oncology Patient Advocate Encounter  [facility] details on Itovebi . I was informed they needed a 2nd pa ( the dose strength) and  that they will not have a decision until after 5pm on 07-03-24 They called the office  today to get the info to process the 2nd pa.  [facility]brance   until Itovebi  is Approved  I will continue to follow up.   Patient has been notified via MyChart     Charlott Hamilton,  CPhT-Adv  she/her/hers [facility]  [facility] Pharmacy Technician Patient Advocate Specialist III [facility] Phone: [phone]  Fax: [phone] [email or web address]

## 2024-07-02 ENCOUNTER — Ambulatory Visit

## 2024-07-02 ENCOUNTER — Other Ambulatory Visit (HOSPITAL_COMMUNITY): Payer: Self-pay

## 2024-07-03 ENCOUNTER — Ambulatory Visit

## 2024-07-04 ENCOUNTER — Ambulatory Visit

## 2024-07-07 ENCOUNTER — Ambulatory Visit

## 2024-07-07 ENCOUNTER — Inpatient Hospital Stay

## 2024-07-07 ENCOUNTER — Inpatient Hospital Stay: Admitting: Hematology and Oncology

## 2024-07-07 NOTE — Progress Notes (Unsigned)
 [facility]        Telephone: [phone]?Fax: [phone]   Oncology Clinical Pharmacist Practitioner Progress Note   Erin Castillo was contacted via telephone visit to discuss her chemotherapy regimen for inavolisib  + palbociclib  + fulvestrant  which they receive under the care of Dr. Amber Stalls.   I connected with Erin Castillo today by telephone and verified that I was speaking with the correct person using two patient identifiers. I discussed the limitations, risks, security and privacy concerns of performing an evaluation and management service by telemedicine and the availability of in-person appointments. The patient/caregiver expressed understanding and agreed to proceed.  Other persons participating in the visit and their role in the encounter: none   Patients location: home  Providers location: clinic   Current treatment regimen and start date Inavolisib  (will start 07/12/24) Palbociclib  (07/05/24) Fulvestrant  (06/25/24)  Interval History She continues on palbociclib  125 mg by mouth daily on days 1 to 21 of a 28-day cycle, and fulvestrant  500 mg IM every 2 weeks x 3 doses, then every 4 weeks thereafter. Therapy is planned to continue until disease progression or unacceptable toxicity.   The patient was last seen by Erin Castillo on 06/18/24 and clinical pharmacy on 06/25/24.  She received her second fulvestrant  injection earlier today and had fasting labs for baseline. She was not able to stay for her clinical pharmacy visit so it is being conducted over the phone per her request. She is due to have fasting labs again, clinical pharmacy visit, and fulvestrant  #3 on 07/22/24. She will then see Dr. Stalls with labs, and zoledronic  acid the week of 08/04/24. Scheduling aware of this change.  She will receive inavolisib  on Friday (07/11/24) and start the next day. She takes her palbociclib  and will take inavolisib  at 8 am.  Response to Therapy She started her  palbociclib  on Saturday and so far is tolerating it well despite some fatigue which is chronic. She received her second fulvestrant  today and will receive her third on 07/22/24. Then every 28 days thereafter. She will receive every 12 week zoledronic  acid the week of 08/04/24 and see Dr. Stalls with fasting labs prior.   Labs, vitals, treatment parameters, and manufacturer guidelines assessing toxicity were reviewed with Erin Castillo today. Based on these values, patient is in agreement to continue therapy at this time.  Allergies Allergies[1]  Vitals    06/25/2024    2:04 PM 06/18/2024    2:39 PM 05/12/2024    1:55 PM  Oncology Vitals  Height 163 cm    Weight 60.147 kg 60.918 kg   Weight (lbs) 132 lbs 10 oz 134 lbs 5 oz   BMI 22.76 kg/m2 23.05 kg/m2   Temp 98 F (36.7 C) 98.3 F (36.8 C) 98 F (36.7 C)  Pulse Rate 81 84 86  BP 109/70 106/68 112/81  Resp 18 17 16   SpO2 100 % 100 % 100 %  BSA (m2) 1.65 m2 1.66 m2     Laboratory Data    Latest Ref Rng & Units 06/25/2024    1:45 PM 06/18/2024   12:54 PM 05/12/2024   12:56 PM  CBC EXTENDED  WBC 4.0 - 10.5 K/uL 5.4  6.9  4.7   RBC 3.87 - 5.11 MIL/uL 3.98  3.98  4.34   Hemoglobin 12.0 - 15.0 g/dL 87.0  86.9  86.3   HCT 36.0 - 46.0 % 38.0  37.6  39.9   Platelets 150 - 400 K/uL 237  248  256   NEUT# 1.7 - 7.7 K/uL 3.4  5.0  2.8   Lymph# 0.7 - 4.0 K/uL 1.4  1.4  1.3        Latest Ref Rng & Units 06/25/2024    1:45 PM 06/18/2024   12:54 PM 05/12/2024   12:56 PM  CMP  Glucose 70 - 99 mg/dL 891  91  80   BUN 6 - 20 mg/dL 17  15  17    Creatinine 0.44 - 1.00 mg/dL 8.97  9.16  9.11   Sodium 135 - 145 mmol/L 141  141  138   Potassium 3.5 - 5.1 mmol/L 3.8  4.0  4.2   Chloride 98 - 111 mmol/L 103  104  104   CO2 22 - 32 mmol/L 30  28  26    Calcium 8.9 - 10.3 mg/dL 9.2  9.2  9.7   Total Protein 6.5 - 8.1 g/dL 7.3  7.0  7.3   Total Bilirubin 0.0 - 1.2 mg/dL 0.3  0.3  0.3   Alkaline Phos 38 - 126 U/L 43  42  37   AST 15 - 41 U/L 19   18  17    ALT 0 - 44 U/L 11  9  10      No results found for: MG Lab Results  Component Value Date   CA2729 175.2 (H) 06/18/2024   CA2729 125.8 (H) 05/12/2024   CA2729 94.9 (H) 04/15/2024     Adverse Effects Assessment Fatigue: chronic but manageable  Adherence Assessment Erin Castillo reports missing 0 doses over the past 1 weeks.   Reason for missed dose: N/A Patient was re-educated on importance of adherence.   Access Assessment Erin Castillo is currently receiving her inavolisib  and palbociclib  [facility] concerns:  None  Medication Reconciliation The patient's medication list was reviewed today with the patient? Yes New medications or herbal supplements have recently been started? No  Any medications have been discontinued? No  The medication list was updated and reconciled based on the patient's most recent medication list in the electronic medical record (EMR) including herbal products and OTC medications.   Medications Current Outpatient Medications  Medication Sig Dispense Refill   Accu-Chek Softclix Lancets lancets as directed 100 each 0   b complex vitamins tablet Take 1 tablet by mouth daily.      Blood Glucose Monitoring Suppl (ACCU-CHEK GUIDE ME) w/Device KIT Check fasting glucose once every 3 days for first week, then weekly for 3 weeks, then every 2 weeks for 8 weeks, then once every 4 weeks thereafter. 1 kit 0   Cholecalciferol (VITAMIN D3) 25 MCG (1000 UT) CAPS 1 capsule.     clobetasol cream (TEMOVATE) 0.05 % Apply 1 Application topically.     dexamethasone  0.5 MG/5ML elixir Swish 10 mL (1 mg) by mouth for 2 minutes, then spit. Repeat 4 times daily. Start on 1st day of inavolisib  (Itovebi ). 474 mL 4   Glucose Blood (BLOOD GLUCOSE TEST STRIPS) STRP Check fasting glucose once every 3 days for first week, then weekly for 3 weeks, then every 2 weeks for 8 weeks, then once every 4 weeks thereafter. 100 strip 0   Inavolisib   (ITOVEBI ) 9 MG TABS tablet Take 1 tablet (9 mg total) by mouth daily. 28 tablet 0   Lancet Device MISC Check fasting glucose once every 3 days for first week, then weekly for 3 weeks, then every 2 weeks for 8 weeks, then once every 4 weeks thereafter.  May substitute to any manufacturer covered by patient's insurance. 1 each 0   melatonin 5 MG TABS Take 5 mg by mouth.     mesalamine (LIALDA) 1.2 G EC tablet Take 1,200 mg by mouth daily with breakfast.     Multiple Vitamin (MULITIVITAMIN WITH MINERALS) TABS Take 1 tablet by mouth daily.     nitroGLYCERIN  (NITROSTAT ) 0.4 MG SL tablet Place 1 tablet (0.4 mg total) under the tongue every 5 (five) minutes as needed for chest pain. 25 tablet 3   ondansetron  (ZOFRAN ) 8 MG tablet Take 1 tablet (8 mg total) by mouth every 8 (eight) hours as needed for nausea or vomiting. 30 tablet 2   palbociclib  (IBRANCE ) 125 MG tablet Take 1 tablet (125 mg total) by mouth daily. Take for 21 days on, 7 days off, repeat every 28 days. 21 tablet 1   SUMAtriptan (IMITREX) 50 MG tablet Take 50 mg by mouth every 2 (two) hours as needed for migraine. May repeat in 2 hours if headache persists or recurs.     traMADol  (ULTRAM ) 50 MG tablet Take 1 tablet (50 mg total) by mouth every 12 (twelve) hours as needed. 30 tablet 0   venlafaxine  XR (EFFEXOR -XR) 37.5 MG 24 hr capsule TAKE 2 CAPSULES (75 MG TOTAL) BY MOUTH DAILY. 180 capsule 2   No current facility-administered medications for this visit.   Drug-Drug Interactions (DDIs) DDIs were evaluated? Yes Significant DDIs? We have reviewed potential interaction with tramadol  and venlafaxine  in the past with patient The patient was instructed to speak with their health care provider and/or the oral chemotherapy pharmacist before starting any new drug, including prescription or over the counter, natural / herbal products, or vitamins.  Supportive Care Fever: reviewed the importance of having a thermometer and the Centers for Disease  Control and Prevention (CDC) definition of fever which is 100.65F (38C) or higher. Patient should call 24/7 triage at [phone] if experiencing a fever or any other symptoms Decreased white blood cells (WBCs) and increased risk for infection Decreased hemoglobin, part of the red blood cells that carry iron and oxygen Decreased platelet count and increased risk for bleeding Changes in liver function Fatigue Nausea or vomiting Hair loss (alopecia) Mouth irritation or sores Pneumonitis / ILD Handling body fluids and wastes Storage and handling Missed doses Avoid grapefruit products Hyperglycemia Diarrhea Rash Ocular changes (dry eye / blurred vision)  Dosing Assessment Hepatic adjustments needed? No  Renal adjustments needed? No  Toxicity adjustments needed? No  The current dosing regimen is appropriate to continue at this time.  Follow-Up Plan Start inavolisib  9 mg by mouth daily. Will start Saturday (07/12/24). Will receiving 07/11/24. Continue palbociclib  125 mg by mouth daily for 21 days, followed by a 7-day rest period.  Continue fulvestrant  500 mg IM every 2 weeks for first 3 doses, then every 28 days thereafter. Last given today (07/08/24), next due on 07/22/24, then week of 08/19/24 Continue zoledronic  acid, 4 mg, IV, every 12 weeks. Last given 05/12/24, next due 08/04/24 Continue cetirizine 10 mg by mouth daily for cutaneous reaction prevention Start  monitoring fasting blood glucose at home once every 3 days for the first week (days 1 to 7), then weekly for 3 weeks (days 8 to 28), then every 2 weeks for the next 8 weeks, then once every 4 weeks thereafter. Will check fasting blood glucose in clinic as well. Will start once starting inavolisib  can contact Dr. Loretha if fasting blood glucose is > 160 mg/dL Start dexamethasone   mouthwash for mucositis prevention. Will start once starting inavolisib . Monitor for side effects Will move labs and Dr. Loretha visit to the following  week so they can be fasting and add zoledronic  acid which is due 08/04/24 Will add fasting early labs, pharmacy clinic visit, and fulvestrant  week of 08/18/24 Erin Castillo can follow up with clinical pharmacy as deemed necessary by Dr. Amber Castillo going forward   Erin Castillo participated in the discussion, expressed understanding, and voiced agreement with the above plan. All questions were answered to her satisfaction. The patient was advised to contact the clinic at [phone] with any questions or concerns prior to her return visit.   I spent 30 minutes assessing and educating the patient.  Quintarius Ferns A. Lucila, PharmD, BCOP, CPP  Norleen DELENA Lucila, RPH-CPP, 07/07/2024  9:01 AM   **Disclaimer: This note was dictated with voice recognition software. Similar sounding words can inadvertently be transcribed and this note may contain transcription errors which may not have been corrected upon publication of note.**      [1] No Known Allergies

## 2024-07-08 ENCOUNTER — Inpatient Hospital Stay

## 2024-07-08 ENCOUNTER — Inpatient Hospital Stay: Admitting: Pharmacist

## 2024-07-08 ENCOUNTER — Ambulatory Visit

## 2024-07-08 ENCOUNTER — Inpatient Hospital Stay: Admitting: Hematology and Oncology

## 2024-07-08 VITALS — BP 106/77 | HR 87 | Temp 98.5°F | Resp 17

## 2024-07-08 DIAGNOSIS — Z5111 Encounter for antineoplastic chemotherapy: Secondary | ICD-10-CM | POA: Diagnosis not present

## 2024-07-08 DIAGNOSIS — C50412 Malignant neoplasm of upper-outer quadrant of left female breast: Secondary | ICD-10-CM

## 2024-07-08 DIAGNOSIS — C50919 Malignant neoplasm of unspecified site of unspecified female breast: Secondary | ICD-10-CM

## 2024-07-08 LAB — CBC WITH DIFFERENTIAL (CANCER CENTER ONLY)
Abs Immature Granulocytes: 0.01 K/uL (ref 0.00–0.07)
Basophils Absolute: 0.1 K/uL (ref 0.0–0.1)
Basophils Relative: 2 %
Eosinophils Absolute: 0.1 K/uL (ref 0.0–0.5)
Eosinophils Relative: 2 %
HCT: 36.5 % (ref 36.0–46.0)
Hemoglobin: 12.6 g/dL (ref 12.0–15.0)
Immature Granulocytes: 0 %
Lymphocytes Relative: 27 %
Lymphs Abs: 0.9 K/uL (ref 0.7–4.0)
MCH: 32.2 pg (ref 26.0–34.0)
MCHC: 34.5 g/dL (ref 30.0–36.0)
MCV: 93.4 fL (ref 80.0–100.0)
Monocytes Absolute: 0.4 K/uL (ref 0.1–1.0)
Monocytes Relative: 13 %
Neutro Abs: 2 K/uL (ref 1.7–7.7)
Neutrophils Relative %: 56 %
Platelet Count: 226 K/uL (ref 150–400)
RBC: 3.91 MIL/uL (ref 3.87–5.11)
RDW: 12.9 % (ref 11.5–15.5)
WBC Count: 3.4 K/uL — ABNORMAL LOW (ref 4.0–10.5)
nRBC: 0 % (ref 0.0–0.2)

## 2024-07-08 LAB — CMP (CANCER CENTER ONLY)
ALT: 9 U/L (ref 0–44)
AST: 18 U/L (ref 15–41)
Albumin: 4.3 g/dL (ref 3.5–5.0)
Alkaline Phosphatase: 51 U/L (ref 38–126)
Anion gap: 7 (ref 5–15)
BUN: 14 mg/dL (ref 6–20)
CO2: 28 mmol/L (ref 22–32)
Calcium: 8.9 mg/dL (ref 8.9–10.3)
Chloride: 106 mmol/L (ref 98–111)
Creatinine: 0.89 mg/dL (ref 0.44–1.00)
GFR, Estimated: >60 mL/min (ref >60)
Glucose, Bld: 88 mg/dL (ref 70–99)
Potassium: 4.2 mmol/L (ref 3.5–5.1)
Sodium: 141 mmol/L (ref 135–145)
Total Bilirubin: 0.2 mg/dL (ref 0.0–1.2)
Total Protein: 7 g/dL (ref 6.5–8.1)

## 2024-07-08 MED ORDER — FULVESTRANT 250 MG/5ML IM SOSY
500.0000 mg | PREFILLED_SYRINGE | Freq: Once | INTRAMUSCULAR | Status: AC
  Administered 2024-07-08: 08:00:00 500 mg via INTRAMUSCULAR
  Filled 2024-07-08: qty 10

## 2024-07-09 ENCOUNTER — Telehealth: Payer: Self-pay | Admitting: Hematology and Oncology

## 2024-07-09 ENCOUNTER — Ambulatory Visit

## 2024-07-09 ENCOUNTER — Inpatient Hospital Stay

## 2024-07-09 ENCOUNTER — Inpatient Hospital Stay: Admitting: Pharmacist

## 2024-07-09 NOTE — Telephone Encounter (Signed)
 I spoke with patient to schedule appointments from 07/08/2024 los. Patient aware of dates/times for all upcoming appointments.

## 2024-07-10 ENCOUNTER — Ambulatory Visit

## 2024-07-11 ENCOUNTER — Ambulatory Visit

## 2024-07-16 ENCOUNTER — Other Ambulatory Visit: Payer: Self-pay | Admitting: Hematology and Oncology

## 2024-07-21 NOTE — Progress Notes (Unsigned)
 " [facility]        Telephone: [phone]?Fax: [phone]   Oncology Clinical Pharmacist Practitioner Progress Note   Erin Castillo was contacted via in-person visit to discuss her chemotherapy regimen for inavolisib  + palbociclib  + fulvestrant  which they receive under the care of Dr. Amber Stalls.   Current treatment regimen and start date Inavolisib  (07/12/24) Palbociclib  (07/05/24) Fulvestrant  (06/25/24)  Interval History She continues on palbociclib  125 mg by mouth daily on days 1 to 21 of a 28-day cycle _ inavolisib  9 mg daily, and fulvestrant  500 mg IM every 2 weeks x 3 doses, then every 4 weeks thereafter. Therapy is planned to continue until disease progression or unacceptable toxicity.   The patient was last seen by Praveena Iruku on 06/18/24 and clinical pharmacy on 07/08/24.  Response to Therapy ***  Labs, vitals, treatment parameters, and manufacturer guidelines assessing toxicity were reviewed with Erin Castillo today. Based on these values, patient is in agreement to continue therapy at this time.  Allergies Allergies[1]  Vitals    07/08/2024    8:03 AM 06/25/2024    2:04 PM 06/18/2024    2:39 PM  Oncology Vitals  Height  163 cm   Weight  60.147 kg 60.918 kg  Weight (lbs)  132 lbs 10 oz 134 lbs 5 oz  BMI  22.76 kg/m2 23.05 kg/m2  Temp 98.5 F (36.9 C) 98 F (36.7 C) 98.3 F (36.8 C)  Pulse Rate 87 81 84  BP 106/77 109/70 106/68  Resp 17 18 17   SpO2 100 % 100 % 100 %  BSA (m2)  1.65 m2 1.66 m2    Laboratory Data    Latest Ref Rng & Units 07/08/2024    7:53 AM 06/25/2024    1:45 PM 06/18/2024   12:54 PM  CBC EXTENDED  WBC 4.0 - 10.5 K/uL 3.4  5.4  6.9   RBC 3.87 - 5.11 MIL/uL 3.91  3.98  3.98   Hemoglobin 12.0 - 15.0 g/dL 87.3  87.0  86.9   HCT 36.0 - 46.0 % 36.5  38.0  37.6   Platelets 150 - 400 K/uL 226  237  248   NEUT# 1.7 - 7.7 K/uL 2.0  3.4  5.0   Lymph# 0.7 - 4.0 K/uL 0.9  1.4  1.4        Latest Ref Rng & Units  07/08/2024    7:53 AM 06/25/2024    1:45 PM 06/18/2024   12:54 PM  CMP  Glucose 70 - 99 mg/dL 88  891  91   BUN 6 - 20 mg/dL 14  17  15    Creatinine 0.44 - 1.00 mg/dL 9.10  8.97  9.16   Sodium 135 - 145 mmol/L 141  141  141   Potassium 3.5 - 5.1 mmol/L 4.2  3.8  4.0   Chloride 98 - 111 mmol/L 106  103  104   CO2 22 - 32 mmol/L 28  30  28    Calcium 8.9 - 10.3 mg/dL 8.9  9.2  9.2   Total Protein 6.5 - 8.1 g/dL 7.0  7.3  7.0   Total Bilirubin 0.0 - 1.2 mg/dL 0.2  0.3  0.3   Alkaline Phos 38 - 126 U/L 51  43  42   AST 15 - 41 U/L 18  19  18    ALT 0 - 44 U/L 9  11  9      No results found for: MG Lab Results  Component  Value Date   CA2729 175.2 (H) 06/18/2024   CA2729 125.8 (H) 05/12/2024   CA2729 94.9 (H) 04/15/2024     Adverse Effects Assessment ***  Adherence Assessment Erin Castillo reports missing *** doses over the past *** weeks.   Reason for missed dose: N/A Patient was re-educated on importance of adherence.   Access Assessment Erin Castillo is currently receiving her inavolisib  and palbociclib  through [facility] concerns:  None  Medication Reconciliation The patient's medication list was reviewed today with the patient? Yes New medications or herbal supplements have recently been started? No  Any medications have been discontinued? No  The medication list was updated and reconciled based on the patient's most recent medication list in the electronic medical record (EMR) including herbal products and OTC medications.   Medications Current Outpatient Medications  Medication Sig Dispense Refill   Accu-Chek Softclix Lancets lancets as directed 100 each 0   b complex vitamins tablet Take 1 tablet by mouth daily.      Blood Glucose Monitoring Suppl (ACCU-CHEK GUIDE ME) w/Device KIT Check fasting glucose once every 3 days for first week, then weekly for 3 weeks, then every 2 weeks for 8 weeks, then once every 4 weeks thereafter. 1 kit 0    Cholecalciferol (VITAMIN D3) 25 MCG (1000 UT) CAPS 1 capsule.     clobetasol cream (TEMOVATE) 0.05 % Apply 1 Application topically.     dexamethasone  0.5 MG/5ML elixir Swish 10 mL (1 mg) by mouth for 2 minutes, then spit. Repeat 4 times daily. Start on 1st day of inavolisib  (Itovebi ). 474 mL 4   Glucose Blood (BLOOD GLUCOSE TEST STRIPS) STRP Check fasting glucose once every 3 days for first week, then weekly for 3 weeks, then every 2 weeks for 8 weeks, then once every 4 weeks thereafter. 100 strip 0   ITOVEBI  9 MG TABS tablet TAKE 1 TABLET BY MOUTH 1 TIME A DAY 28 tablet 0   Lancet Device MISC Check fasting glucose once every 3 days for first week, then weekly for 3 weeks, then every 2 weeks for 8 weeks, then once every 4 weeks thereafter. May substitute to any manufacturer covered by patient's insurance. 1 each 0   melatonin 5 MG TABS Take 5 mg by mouth.     mesalamine (LIALDA) 1.2 G EC tablet Take 1,200 mg by mouth daily with breakfast.     Multiple Vitamin (MULITIVITAMIN WITH MINERALS) TABS Take 1 tablet by mouth daily.     nitroGLYCERIN  (NITROSTAT ) 0.4 MG SL tablet Place 1 tablet (0.4 mg total) under the tongue every 5 (five) minutes as needed for chest pain. 25 tablet 3   ondansetron  (ZOFRAN ) 8 MG tablet Take 1 tablet (8 mg total) by mouth every 8 (eight) hours as needed for nausea or vomiting. 30 tablet 2   palbociclib  (IBRANCE ) 125 MG tablet Take 1 tablet (125 mg total) by mouth daily. Take for 21 days on, 7 days off, repeat every 28 days. 21 tablet 1   SUMAtriptan (IMITREX) 50 MG tablet Take 50 mg by mouth every 2 (two) hours as needed for migraine. May repeat in 2 hours if headache persists or recurs.     traMADol  (ULTRAM ) 50 MG tablet Take 1 tablet (50 mg total) by mouth every 12 (twelve) hours as needed. 30 tablet 0   venlafaxine  XR (EFFEXOR -XR) 37.5 MG 24 hr capsule TAKE 2 CAPSULES (75 MG TOTAL) BY MOUTH DAILY. 180 capsule 2   No current facility-administered medications  for this visit.    Drug-Drug Interactions (DDIs) DDIs were evaluated? Yes Significant DDIs? We have reviewed potential interaction with tramadol  and venlafaxine  in the past with patient The patient was instructed to speak with their health care provider and/or the oral chemotherapy pharmacist before starting any new drug, including prescription or over the counter, natural / herbal products, or vitamins.  Supportive Care Fever: reviewed the importance of having a thermometer and the Centers for Disease Control and Prevention (CDC) definition of fever which is 100.72F (38C) or higher. Patient should call 24/7 triage at [phone] if experiencing a fever or any other symptoms Decreased white blood cells (WBCs) and increased risk for infection Decreased hemoglobin, part of the red blood cells that carry iron and oxygen Decreased platelet count and increased risk for bleeding Changes in liver function Fatigue Nausea or vomiting Hair loss (alopecia) Mouth irritation or sores Pneumonitis / ILD Handling body fluids and wastes Storage and handling Missed doses Avoid grapefruit products Hyperglycemia Diarrhea Rash Ocular changes (dry eye / blurred vision)  Dosing Assessment Hepatic adjustments needed? No  Renal adjustments needed? No  Toxicity adjustments needed? No  The current dosing regimen is appropriate to continue at this time.  Follow-Up Plan *** inavolisib  9 mg by mouth daily.  Continue palbociclib  125 mg by mouth daily for 21 days, followed by a 7-day rest period.  Continue fulvestrant  500 mg IM every 2 weeks for first 3 doses, then every 28 days thereafter. Last given today (07/08/24), due today, then 08/20/24 Continue zoledronic  acid, 4 mg, IV, every 12 weeks. Last given 05/12/24, next due 08/04/24 Continue cetirizine 10 mg by mouth daily for cutaneous reaction prevention Continue monitoring fasting blood glucose at home once every 3 days for the first week (days 1 to 7), then weekly for 3  weeks (days 8 to 28), then every 2 weeks for the next 8 weeks, then once every 4 weeks thereafter. Will check fasting blood glucose in clinic as well. Will start once starting inavolisib  can contact Dr. Loretha if fasting blood glucose is > 160 mg/dL Continue dexamethasone  mouthwash for mucositis prevention. Will start once starting inavolisib . Monitor for side effects Fasting labs, Dr. Loretha visit, and zoledronic  acid on 08/05/24 Fasting labs, pharmacy clinic visit, and fulvestrant  on 08/20/24 Erin Castillo can follow up with clinical pharmacy as deemed necessary by Dr. Amber Iruku going forward   Erin Castillo participated in the discussion, expressed understanding, and voiced agreement with the above plan. All questions were answered to her satisfaction. The patient was advised to contact the clinic at [phone] with any questions or concerns prior to her return visit.   I spent 30 minutes assessing and educating the patient.  Charlotte Brafford A. Castillo, PharmD, BCOP, CPP  Erin Castillo, RPH-CPP, 07/21/2024  7:47 AM   **Disclaimer: This note was dictated with voice recognition software. Similar sounding words can inadvertently be transcribed and this note may contain transcription errors which may not have been corrected upon publication of note.**      [1] No Known Allergies  "

## 2024-07-22 ENCOUNTER — Inpatient Hospital Stay: Admitting: Hematology and Oncology

## 2024-07-22 ENCOUNTER — Inpatient Hospital Stay: Admitting: Pharmacist

## 2024-07-22 ENCOUNTER — Inpatient Hospital Stay

## 2024-07-22 VITALS — BP 101/64 | HR 80 | Temp 97.7°F | Resp 18 | Ht 64.0 in | Wt 137.1 lb

## 2024-07-22 DIAGNOSIS — Z170 Estrogen receptor positive status [ER+]: Secondary | ICD-10-CM

## 2024-07-22 DIAGNOSIS — C50919 Malignant neoplasm of unspecified site of unspecified female breast: Secondary | ICD-10-CM

## 2024-07-22 DIAGNOSIS — Z5111 Encounter for antineoplastic chemotherapy: Secondary | ICD-10-CM | POA: Diagnosis not present

## 2024-07-22 LAB — CMP (CANCER CENTER ONLY)
ALT: 10 U/L (ref 0–44)
AST: 19 U/L (ref 15–41)
Albumin: 4.3 g/dL (ref 3.5–5.0)
Alkaline Phosphatase: 65 U/L (ref 38–126)
Anion gap: 7 (ref 5–15)
BUN: 17 mg/dL (ref 6–20)
CO2: 31 mmol/L (ref 22–32)
Calcium: 9.4 mg/dL (ref 8.9–10.3)
Chloride: 105 mmol/L (ref 98–111)
Creatinine: 1 mg/dL (ref 0.44–1.00)
GFR, Estimated: >60 mL/min
Glucose, Bld: 90 mg/dL (ref 70–99)
Potassium: 4.4 mmol/L (ref 3.5–5.1)
Sodium: 143 mmol/L (ref 135–145)
Total Bilirubin: 0.2 mg/dL (ref 0.0–1.2)
Total Protein: 7.2 g/dL (ref 6.5–8.1)

## 2024-07-22 LAB — CBC WITH DIFFERENTIAL (CANCER CENTER ONLY)
Abs Immature Granulocytes: 0.01 K/uL (ref 0.00–0.07)
Basophils Absolute: 0.1 K/uL (ref 0.0–0.1)
Basophils Relative: 2 %
Eosinophils Absolute: 0.1 K/uL (ref 0.0–0.5)
Eosinophils Relative: 2 %
HCT: 37.9 % (ref 36.0–46.0)
Hemoglobin: 12.9 g/dL (ref 12.0–15.0)
Immature Granulocytes: 0 %
Lymphocytes Relative: 49 %
Lymphs Abs: 1.3 K/uL (ref 0.7–4.0)
MCH: 31.7 pg (ref 26.0–34.0)
MCHC: 34 g/dL (ref 30.0–36.0)
MCV: 93.1 fL (ref 80.0–100.0)
Monocytes Absolute: 0.1 K/uL (ref 0.1–1.0)
Monocytes Relative: 5 %
Neutro Abs: 1.1 K/uL — ABNORMAL LOW (ref 1.7–7.7)
Neutrophils Relative %: 42 %
Platelet Count: 197 K/uL (ref 150–400)
RBC: 4.07 MIL/uL (ref 3.87–5.11)
RDW: 12.5 % (ref 11.5–15.5)
Smear Review: NORMAL
WBC Count: 2.6 K/uL — ABNORMAL LOW (ref 4.0–10.5)
nRBC: 0 % (ref 0.0–0.2)

## 2024-07-22 MED ORDER — FULVESTRANT 250 MG/5ML IM SOSY
500.0000 mg | PREFILLED_SYRINGE | Freq: Once | INTRAMUSCULAR | Status: AC
  Administered 2024-07-22: 500 mg via INTRAMUSCULAR

## 2024-07-23 LAB — CANCER ANTIGEN 27.29: CA 27.29: 160.2 U/mL — ABNORMAL HIGH (ref 0.0–38.6)

## 2024-07-23 LAB — CANCER ANTIGEN 15-3: CA 15-3: 143 U/mL — ABNORMAL HIGH (ref 0.0–25.0)

## 2024-07-25 ENCOUNTER — Other Ambulatory Visit (HOSPITAL_COMMUNITY): Payer: Self-pay

## 2024-07-28 ENCOUNTER — Other Ambulatory Visit (HOSPITAL_COMMUNITY): Payer: Self-pay

## 2024-07-29 ENCOUNTER — Other Ambulatory Visit: Payer: Self-pay

## 2024-07-30 ENCOUNTER — Other Ambulatory Visit: Payer: Self-pay

## 2024-07-30 ENCOUNTER — Telehealth: Payer: Self-pay

## 2024-07-30 ENCOUNTER — Inpatient Hospital Stay: Admitting: Hematology and Oncology

## 2024-07-30 ENCOUNTER — Encounter: Payer: Self-pay | Admitting: Hematology and Oncology

## 2024-07-30 ENCOUNTER — Inpatient Hospital Stay

## 2024-07-30 ENCOUNTER — Other Ambulatory Visit (HOSPITAL_COMMUNITY): Payer: Self-pay

## 2024-07-30 NOTE — Telephone Encounter (Signed)
 Oral Oncology Patient Advocate Encounter   Received notification that prior authorization renewal  for Itovebi  is required.   PA submitted on 07/30/2024 Submitted online through [email or web address] P#[id number] Status is pending      Charlott Hamilton,  CPhT-Adv  she/her/hers [facility]  [facility] Pharmacy Technician Patient Advocate Specialist III [facility] Phone: [phone]  Fax: [phone] [email or web address]

## 2024-07-31 ENCOUNTER — Other Ambulatory Visit (HOSPITAL_COMMUNITY): Payer: Self-pay

## 2024-07-31 ENCOUNTER — Encounter: Payer: Self-pay | Admitting: Hematology and Oncology

## 2024-08-01 ENCOUNTER — Other Ambulatory Visit (HOSPITAL_COMMUNITY): Payer: Self-pay

## 2024-08-01 ENCOUNTER — Other Ambulatory Visit: Payer: Self-pay | Admitting: Hematology and Oncology

## 2024-08-01 MED ORDER — TRAMADOL HCL 50 MG PO TABS: 50.0000 mg | ORAL_TABLET | Freq: Two times a day (BID) | ORAL | 0 refills | Status: AC | PRN

## 2024-08-01 NOTE — Telephone Encounter (Signed)
 Oral Oncology Patient Advocate Encounter  Prior Authorization for Itovebi  has been approved through [employer].  [facility] has delivery date set for 08-06-24  Patient has been notified via MyChart    Charlott Hamilton,  CPhT-Adv  she/her/hers [facility]  [facility] Pharmacy Technician Patient Advocate Specialist III [facility] Phone: [phone]  Fax: [phone] [email or web address]

## 2024-08-05 ENCOUNTER — Inpatient Hospital Stay

## 2024-08-05 ENCOUNTER — Telehealth: Payer: Self-pay

## 2024-08-05 ENCOUNTER — Inpatient Hospital Stay: Admitting: Hematology and Oncology

## 2024-08-05 ENCOUNTER — Inpatient Hospital Stay: Attending: Hematology and Oncology

## 2024-08-05 VITALS — BP 106/61 | HR 87 | Temp 97.7°F | Resp 16 | Wt 136.4 lb

## 2024-08-05 DIAGNOSIS — Z833 Family history of diabetes mellitus: Secondary | ICD-10-CM | POA: Insufficient documentation

## 2024-08-05 DIAGNOSIS — C7800 Secondary malignant neoplasm of unspecified lung: Secondary | ICD-10-CM | POA: Diagnosis not present

## 2024-08-05 DIAGNOSIS — T451X5A Adverse effect of antineoplastic and immunosuppressive drugs, initial encounter: Secondary | ICD-10-CM | POA: Insufficient documentation

## 2024-08-05 DIAGNOSIS — Z8042 Family history of malignant neoplasm of prostate: Secondary | ICD-10-CM | POA: Insufficient documentation

## 2024-08-05 DIAGNOSIS — Z1721 Progesterone receptor positive status: Secondary | ICD-10-CM | POA: Insufficient documentation

## 2024-08-05 DIAGNOSIS — G893 Neoplasm related pain (acute) (chronic): Secondary | ICD-10-CM | POA: Insufficient documentation

## 2024-08-05 DIAGNOSIS — Z1732 Human epidermal growth factor receptor 2 negative status: Secondary | ICD-10-CM | POA: Insufficient documentation

## 2024-08-05 DIAGNOSIS — C50919 Malignant neoplasm of unspecified site of unspecified female breast: Secondary | ICD-10-CM

## 2024-08-05 DIAGNOSIS — Z170 Estrogen receptor positive status [ER+]: Secondary | ICD-10-CM | POA: Diagnosis not present

## 2024-08-05 DIAGNOSIS — N6489 Other specified disorders of breast: Secondary | ICD-10-CM | POA: Insufficient documentation

## 2024-08-05 DIAGNOSIS — Z9010 Acquired absence of unspecified breast and nipple: Secondary | ICD-10-CM | POA: Diagnosis not present

## 2024-08-05 DIAGNOSIS — Z79899 Other long term (current) drug therapy: Secondary | ICD-10-CM | POA: Insufficient documentation

## 2024-08-05 DIAGNOSIS — C50412 Malignant neoplasm of upper-outer quadrant of left female breast: Secondary | ICD-10-CM

## 2024-08-05 DIAGNOSIS — D701 Agranulocytosis secondary to cancer chemotherapy: Secondary | ICD-10-CM | POA: Insufficient documentation

## 2024-08-05 DIAGNOSIS — C7951 Secondary malignant neoplasm of bone: Secondary | ICD-10-CM | POA: Insufficient documentation

## 2024-08-05 DIAGNOSIS — Z7983 Long term (current) use of bisphosphonates: Secondary | ICD-10-CM | POA: Diagnosis not present

## 2024-08-05 DIAGNOSIS — Z79810 Long term (current) use of selective estrogen receptor modulators (SERMs): Secondary | ICD-10-CM | POA: Diagnosis not present

## 2024-08-05 DIAGNOSIS — Z923 Personal history of irradiation: Secondary | ICD-10-CM | POA: Insufficient documentation

## 2024-08-05 DIAGNOSIS — R040 Epistaxis: Secondary | ICD-10-CM | POA: Insufficient documentation

## 2024-08-05 DIAGNOSIS — K1231 Oral mucositis (ulcerative) due to antineoplastic therapy: Secondary | ICD-10-CM | POA: Insufficient documentation

## 2024-08-05 DIAGNOSIS — J340 Abscess, furuncle and carbuncle of nose: Secondary | ICD-10-CM | POA: Insufficient documentation

## 2024-08-05 DIAGNOSIS — M8580 Other specified disorders of bone density and structure, unspecified site: Secondary | ICD-10-CM | POA: Insufficient documentation

## 2024-08-05 DIAGNOSIS — Z8052 Family history of malignant neoplasm of bladder: Secondary | ICD-10-CM | POA: Diagnosis not present

## 2024-08-05 LAB — CMP (CANCER CENTER ONLY)
ALT: 15 U/L (ref 0–44)
AST: 21 U/L (ref 15–41)
Albumin: 4.1 g/dL (ref 3.5–5.0)
Alkaline Phosphatase: 70 U/L (ref 38–126)
Anion gap: 10 (ref 5–15)
BUN: 13 mg/dL (ref 6–20)
CO2: 27 mmol/L (ref 22–32)
Calcium: 8.7 mg/dL — ABNORMAL LOW (ref 8.9–10.3)
Chloride: 106 mmol/L (ref 98–111)
Creatinine: 0.92 mg/dL (ref 0.44–1.00)
GFR, Estimated: >60 mL/min
Glucose, Bld: 89 mg/dL (ref 70–99)
Potassium: 4.2 mmol/L (ref 3.5–5.1)
Sodium: 143 mmol/L (ref 135–145)
Total Bilirubin: 0.3 mg/dL (ref 0.0–1.2)
Total Protein: 7.3 g/dL (ref 6.5–8.1)

## 2024-08-05 LAB — CBC WITH DIFFERENTIAL (CANCER CENTER ONLY)
Abs Immature Granulocytes: 0 K/uL (ref 0.00–0.07)
Basophils Absolute: 0 K/uL (ref 0.0–0.1)
Basophils Relative: 2 %
Eosinophils Absolute: 0 K/uL (ref 0.0–0.5)
Eosinophils Relative: 1 %
HCT: 36.6 % (ref 36.0–46.0)
Hemoglobin: 12.3 g/dL (ref 12.0–15.0)
Immature Granulocytes: 0 %
Lymphocytes Relative: 47 %
Lymphs Abs: 0.9 K/uL (ref 0.7–4.0)
MCH: 31 pg (ref 26.0–34.0)
MCHC: 33.6 g/dL (ref 30.0–36.0)
MCV: 92.2 fL (ref 80.0–100.0)
Monocytes Absolute: 0.2 K/uL (ref 0.1–1.0)
Monocytes Relative: 11 %
Neutro Abs: 0.8 K/uL — ABNORMAL LOW (ref 1.7–7.7)
Neutrophils Relative %: 39 %
Platelet Count: 214 K/uL (ref 150–400)
RBC: 3.97 MIL/uL (ref 3.87–5.11)
RDW: 13.5 % (ref 11.5–15.5)
Smear Review: NORMAL
WBC Count: 1.9 K/uL — ABNORMAL LOW (ref 4.0–10.5)
nRBC: 0 % (ref 0.0–0.2)

## 2024-08-05 MED ORDER — PALBOCICLIB 100 MG PO TABS: 100.0000 mg | ORAL_TABLET | Freq: Every day | ORAL | 3 refills | Status: AC

## 2024-08-05 MED ORDER — ZOLEDRONIC ACID 4 MG/100ML IV SOLN
4.0000 mg | Freq: Once | INTRAVENOUS | Status: AC
  Administered 2024-08-05: 4 mg via INTRAVENOUS
  Filled 2024-08-05: qty 100

## 2024-08-05 MED ORDER — SODIUM CHLORIDE 0.9 % IV SOLN: INTRAVENOUS | Status: DC

## 2024-08-05 NOTE — Telephone Encounter (Signed)
 Placed call to pt per MD to let her know that she needs to hold her Ibrance . She verbalized understanding.

## 2024-08-05 NOTE — Progress Notes (Signed)
 [facility] CONSULT NOTE  Patient Care Team: Dyane Anthony RAMAN, FNP as PCP - General (Family Medicine) Jason Charleston, MD (Inactive) as Consulting Physician (Radiation Oncology) Loretha Ash, MD as Consulting Physician (Hematology and Oncology) Tyree Nanetta SAILOR, RN as Oncology Nurse Navigator Ebbie Cough, MD as Consulting Physician (General Surgery) Dewey Rush, MD as Consulting Physician (Radiation Oncology)  CHIEF COMPLAINTS/PURPOSE OF CONSULTATION:  Metastatic breast cancer endocrine refractory  HISTORY OF PRESENTING ILLNESS:  Erin Castillo 59 y.o. female is here because of recent diagnosis of left breast cancer  I reviewed her records extensively and collaborated the history with the patient.  SUMMARY OF ONCOLOGIC HISTORY: Oncology History  Malignant neoplasm of upper-outer quadrant of left breast in female, estrogen receptor positive (HCC)  04/26/2011 Initial Diagnosis   Malignant neoplasm of upper-outer quadrant of left breast in female, estrogen receptor positive (HCC)   11/30/2023 Mammogram   Focal asymmetry with calcifications in the left breast is indeterminate. Additional views with US  is recommended. US  in the breast   12/11/2023 Pathology Results   Left breast needle core biopsy showed invasive poorly differentiated adenocarcinoma grade 3 prognostic showed ER 100% positive strong staining PR 60% positive strong staining Ki-67 of 30% and group 5 HER2 negative    Genetic Testing   [facility] CancerNext-Expanded Panel+RNA was Negative. Report date is 01/06/2024.   The CancerNext-Expanded gene panel offered by [facility] and includes sequencing, rearrangement, and RNA analysis for the following 77 genes: AIP, ALK, APC, ATM, AXIN2, BAP1, BARD1, BMPR1A, BRCA1, BRCA2, BRIP1, CDC73, CDH1, CDK4, CDKN1B, CDKN2A, CEBPA, CHEK2, CTNNA1, DDX41, DICER1, ETV6, FH, FLCN, GATA2, LZTR1, MAX, MBD4, MEN1, MET, MLH1, MSH2, MSH3, MSH6, MUTYH, NF1, NF2, NTHL1, PALB2, PHOX2B,  PMS2, POT1, PRKAR1A, PTCH1, PTEN, RAD51C, RAD51D, RB1, RET, RPS20, RUNX1, SDHA, SDHAF2, SDHB, SDHC, SDHD, SMAD4, SMARCA4, SMARCB1, SMARCE1, STK11, SUFU, TMEM127, TP53, TSC1, TSC2, VHL, and WT1 (sequencing and deletion/duplication); EGFR, HOXB13, KIT, MITF, PDGFRA, POLD1, and POLE (sequencing only); EPCAM and GREM1 (deletion/duplication only).      Discussed the use of AI scribe software for clinical note transcription with the patient, who gave verbal consent to proceed.  History of Present Illness Erin Castillo is a 59 year old female with metastatic breast cancer with bone metastases who presents for oncology follow-up due to new left-sided hip and pelvic pain.  She has developed new left-sided hip and pelvic pain over the past two weeks, described as severe at night (pain score 8-9/10) and moderate during the day (5/10), localized to the left groin and loin with radiation to the leg. The pain disrupts sleep and now requires daily tramadol , whereas previously she rarely used analgesics. She is uncertain if the pain is related to her medication or underlying malignancy, noting that prior osseous lesions were on the right side and this pain is new on the left.  She is currently receiving Ibrance  (palbociclib ) at 125 mg daily (21 days on/7 days off), Faslodex  (fulvestrant ) every 28 days, and Zometa  as scheduled, with her next Zometa  dose administered today. Ibrance  was held for one week due to severe oral mucositis, which improved during the break; she resumed therapy as scheduled and uses medicated mouthwash for symptomatic relief. She also developed two painful nasal ulcers and had a mild episode of epistaxis, which resolved with topical Vaseline.  Her white blood cell count was 1,900 today, down from 2,600 two weeks ago. She is awaiting updated tumor marker results, which have shown some fluctuation in recent weeks.  She experienced a transient episode of  constipation lasting one week, which resolved  with dietary modifications. She had a brief period of nausea and stomach discomfort, now resolved. Rest of the pertinent 10 point ROS reviewed and negative  MEDICAL HISTORY:  Past Medical History:  Diagnosis Date   Achilles tendonitis    Anxiety    Breast cancer (HCC) 04/20/2011   Left, ER/PR+   Chest pain    hx - no current problem   Depression    Dyspareunia in female    GAD (generalized anxiety disorder)    Heel spur, left    History of radiation therapy    09/18/11 to 11/03/11, left breast   Hypercholesterolemia    Hypotension    Insomnia    occasional   Migraines    Osteopenia    Postmenopausal atrophic vaginitis    Pulmonary nodule 12/2023   right lower lobe   Ulcerative colitis (HCC)     SURGICAL HISTORY: Past Surgical History:  Procedure Laterality Date   BREAST LUMPECTOMY  05/15/11   left breast lumpectomy    CESAREAN SECTION  2000   ENDOBRONCHIAL ULTRASOUND  01/15/2024   Procedure: ENDOBRONCHIAL ULTRASOUND (EBUS);  Surgeon: Shelah Lamar RAMAN, MD;  Location: [facility]COPY;  Service: Pulmonary;;   MASTECTOMY PARTIAL / LUMPECTOMY W/ AXILLARY LYMPHADENECTOMY  10/12   snbx   NASAL SINUS SURGERY  1991   PORT-A-CATH REMOVAL  12/12/2011   Procedure: REMOVAL PORT-A-CATH;  Surgeon: Morene ONEIDA Olives, MD;  Location: [facility] ORS;  Service: General;  Laterality: Right;   PORTACATH PLACEMENT  06/12/2011   Procedure: INSERTION PORT-A-CATH;  Surgeon: Morene ONEIDA Olives, MD;  Location: [facility];  Service: General;  Laterality: Right;  Right Subclavian Vein   SINUS EXPLORATION     remote   VIDEO BRONCHOSCOPY WITH ENDOBRONCHIAL NAVIGATION Right 01/15/2024   Procedure: VIDEO BRONCHOSCOPY WITH ENDOBRONCHIAL NAVIGATION;  Surgeon: Shelah Lamar RAMAN, MD;  Location: [facility] ENDOSCOPY;  Service: Pulmonary;  Laterality: Right;    SOCIAL HISTORY: Social History   Socioeconomic History   Marital status: Married    Spouse name: Not on file   Number of children: Not on file    Years of education: Not on file   Highest education level: Not on file  Occupational History   Occupation: print production planner  Tobacco Use   Smoking status: Never    Passive exposure: Never   Smokeless tobacco: Never  Vaping Use   Vaping status: Never Used  Substance and Sexual Activity   Alcohol use: Yes    Alcohol/week: 2.0 - 3.0 standard drinks of alcohol    Types: 2 - 3 Glasses of wine per week   Drug use: No   Sexual activity: Yes    Birth control/protection: Post-menopausal  Other Topics Concern   Not on file  Social History Narrative   3 children   Social Drivers of Health   Tobacco Use: Low Risk (01/15/2024)   Patient History    Smoking Tobacco Use: Never    Smokeless Tobacco Use: Never    Passive Exposure: Never  Financial Resource Strain: Not on file  Food Insecurity: No Food Insecurity (04/23/2024)   Epic    Worried About Programme Researcher, Broadcasting/film/video in the Last Year: Never true    Ran Out of Food in the Last Year: Never true  Transportation Needs: No Transportation Needs (04/23/2024)   Epic    Lack of Transportation (Medical): No    Lack of Transportation (Non-Medical): No  Physical Activity: Not on file  Stress:  Not on file  Social Connections: Not on file  Intimate Partner Violence: Not At Risk (04/23/2024)   Epic    Fear of Current or Ex-Partner: No    Emotionally Abused: No    Physically Abused: No    Sexually Abused: No  Depression (PHQ2-9): Low Risk (05/12/2024)   Depression (PHQ2-9)    PHQ-2 Score: 0  Alcohol Screen: Not on file  Housing: Unknown (04/23/2024)   Epic    Unable to Pay for Housing in the Last Year: No    Number of Times Moved in the Last Year: Not on file    Homeless in the Last Year: No  Utilities: Not At Risk (04/23/2024)   Epic    Threatened with loss of utilities: No  Health Literacy: Not on file    FAMILY HISTORY: Family History  Problem Relation Age of Onset   Bladder Cancer Father 27   Diabetes Father    Prostate cancer Paternal  Grandfather     ALLERGIES:  has no known allergies.  MEDICATIONS:  Current Outpatient Medications  Medication Sig Dispense Refill   Accu-Chek Softclix Lancets lancets as directed 100 each 0   b complex vitamins tablet Take 1 tablet by mouth daily.      Blood Glucose Monitoring Suppl (ACCU-CHEK GUIDE ME) w/Device KIT Check fasting glucose once every 3 days for first week, then weekly for 3 weeks, then every 2 weeks for 8 weeks, then once every 4 weeks thereafter. 1 kit 0   Cholecalciferol (VITAMIN D3) 25 MCG (1000 UT) CAPS 1 capsule.     clobetasol cream (TEMOVATE) 0.05 % Apply 1 Application topically.     dexamethasone  0.5 MG/5ML elixir Swish 10 mL (1 mg) by mouth for 2 minutes, then spit. Repeat 4 times daily. Start on 1st day of inavolisib  (Itovebi ). 474 mL 4   Glucose Blood (BLOOD GLUCOSE TEST STRIPS) STRP Check fasting glucose once every 3 days for first week, then weekly for 3 weeks, then every 2 weeks for 8 weeks, then once every 4 weeks thereafter. 100 strip 0   ITOVEBI  9 MG TABS tablet TAKE 1 TABLET BY MOUTH 1 TIME A DAY 28 tablet 0   Lancet Device MISC Check fasting glucose once every 3 days for first week, then weekly for 3 weeks, then every 2 weeks for 8 weeks, then once every 4 weeks thereafter. May substitute to any manufacturer covered by patient's insurance. 1 each 0   melatonin 5 MG TABS Take 5 mg by mouth.     mesalamine (LIALDA) 1.2 G EC tablet Take 1,200 mg by mouth daily with breakfast.     Multiple Vitamin (MULITIVITAMIN WITH MINERALS) TABS Take 1 tablet by mouth daily.     nitroGLYCERIN  (NITROSTAT ) 0.4 MG SL tablet Place 1 tablet (0.4 mg total) under the tongue every 5 (five) minutes as needed for chest pain. 25 tablet 3   ondansetron  (ZOFRAN ) 8 MG tablet Take 1 tablet (8 mg total) by mouth every 8 (eight) hours as needed for nausea or vomiting. 30 tablet 2   palbociclib  (IBRANCE ) 125 MG tablet Take 1 tablet (125 mg total) by mouth daily. Take for 21 days on, 7 days off,  repeat every 28 days. 21 tablet 1   SUMAtriptan (IMITREX) 50 MG tablet Take 50 mg by mouth every 2 (two) hours as needed for migraine. May repeat in 2 hours if headache persists or recurs.     traMADol  (ULTRAM ) 50 MG tablet Take 1 tablet (50 mg total)  by mouth every 12 (twelve) hours as needed. 30 tablet 0   venlafaxine  XR (EFFEXOR -XR) 37.5 MG 24 hr capsule TAKE 2 CAPSULES (75 MG TOTAL) BY MOUTH DAILY. 180 capsule 2   No current facility-administered medications for this visit.    PHYSICAL EXAMINATION: ECOG PERFORMANCE STATUS: 0 - Asymptomatic   BP 106/61 (BP Location: Right Arm, Patient Position: Sitting)   Pulse 87   Temp 97.7 F (36.5 C) (Temporal)   Resp 16   Wt 136 lb 6.4 oz (61.9 kg)   LMP 05/04/2011   SpO2 100%   BMI 23.41 kg/m   She appears well, no distress No cervical or axillary adenopathy CTA bilaterally RRR No LE edema.  LABORATORY DATA:  I have reviewed the data as listed Lab Results  Component Value Date   WBC 1.9 (L) 08/05/2024   HGB 12.3 08/05/2024   HCT 36.6 08/05/2024   MCV 92.2 08/05/2024   PLT 214 08/05/2024   Lab Results  Component Value Date   NA 143 07/22/2024   K 4.4 07/22/2024   CL 105 07/22/2024   CO2 31 07/22/2024    RADIOGRAPHIC STUDIES: I have personally reviewed the radiological reports and agreed with the findings in the report.  ASSESSMENT AND PLAN:   59 y.o. BRCA negative [place] woman    (1) Status post left lumpectomy and sentinel lymph node biopsy October of 2012 for a T2 N1, stage IIB invasive ductal carcinoma, grade 2,  strongly estrogen and progesterone receptor positive, with an MIB-1 of 9% and no HER-2/neu amplification.    (2) Treated initially with docetaxel , doxorubicin , and cyclophosphamide  for one cycle, with very poor tolerance   (3) status post cyclophosphamide / docetaxel , for an additional three cycles completed January 2013   (4)  Status post radiation, completed mid-April 2013   (5)  on tamoxifen  as  of April 2013-- switched to anastrozole  March 2018, to be discontinued February 2020             (a) bone density January 2019 shows a T score of -1.7  (6) in May 2025 she had a mammogram and an ultrasound which showed a 2 x 1.7 x 2 cm irregular mass highly suggestive of malignancy.  This showed invasive poorly differentiated adenocarcinoma grade 3, ER 100% positive strong staining PR 60% positive strong staining, Ki-67 of 30% group 5 HER2 negative  (7) we did staging scans given concern for local recurrence which showed right lower lobe lung nodule highly suspicious for metastatic disease versus a second primary.  Hence we proceeded with the PET/CT which showed multiple sites concerning for metastatic disease including mediastinal adenopathy, lung, bone disease.  (8)Bronchoscopy confirmed met breast cancer, ER pos, PR pos, Her 2 1+   (9) She did not respond to verzenio  and letrozole .  PLAN Assessment & Plan Endocrine refractory metastatic breast cancer, PIK3 mutated, ER positive She is currently on inavolisib , Ibrance  and faslodex . Tumor markers show minor fluctuation. New left-sided hip and pelvic pain raises concern for progression.  - Administered Zometa  infusion today. - Continue Ibrance , Faslodex , and inavolisib  per current regimen. - Monitor tumor markers; review today's results and follow trends. - Consider repeat imaging if tumor markers increase or symptoms worsen. - Next follow-up mid-February, aiming for 28-day Faslodex  cycle.  Chemotherapy-induced neutropenia Neutropenia likely due to Ibrance  at maximum dose. Current WBC 1,900, down from 2,600.  ANC at 800 - Consider holding Ibrance  for an additional week  - Will also reduce dose of Ibrance  to 100 mg.  Oral mucositis secondary to chemotherapy Significant oral mucositis improved with therapy  Nasal ulcers and mild epistaxis managed with topical Vaseline. - Continue medicated mouthwash for oral mucositis. - Advise continued  use of Vaseline for nasal ulcers and mild epistaxis.  Neoplasm-related pain ? New, worsening left-sided hip and pelvic pain requiring daily tramadol ,  - Continue tramadol  as needed for analgesia. - Monitor pain and adjust analgesic regimen as indicated. - Consider further imaging if pain worsens or tumor markers increase.  All questions were answered. The patient knows to call the clinic with any problems, questions or concerns.    Amber Stalls, MD 08/05/2024

## 2024-08-05 NOTE — Patient Instructions (Signed)

## 2024-08-06 ENCOUNTER — Other Ambulatory Visit: Payer: Self-pay | Admitting: Hematology and Oncology

## 2024-08-06 LAB — CANCER ANTIGEN 15-3: CA 15-3: 97.5 U/mL — ABNORMAL HIGH (ref 0.0–25.0)

## 2024-08-06 LAB — CANCER ANTIGEN 27.29: CA 27.29: 112.4 U/mL — ABNORMAL HIGH (ref 0.0–38.6)

## 2024-08-11 ENCOUNTER — Encounter: Payer: Self-pay | Admitting: Hematology and Oncology

## 2024-08-11 ENCOUNTER — Other Ambulatory Visit (HOSPITAL_COMMUNITY): Payer: Self-pay

## 2024-08-12 ENCOUNTER — Other Ambulatory Visit: Payer: Self-pay | Admitting: Hematology and Oncology

## 2024-08-19 ENCOUNTER — Inpatient Hospital Stay

## 2024-08-19 ENCOUNTER — Inpatient Hospital Stay: Admitting: Pharmacist

## 2024-08-19 ENCOUNTER — Telehealth: Payer: Self-pay | Admitting: Pharmacist

## 2024-08-19 NOTE — Telephone Encounter (Signed)
 I spoke with patient and she has been rescheduled form 08/20/2024 to 08/28/2024 due to weather. Patient is aware of 08/28/2024 and 09/25/2024 appointments.

## 2024-08-19 NOTE — Progress Notes (Unsigned)
 " [facility]        Telephone: [phone]?Fax: [phone]   Oncology Clinical Pharmacist Practitioner Progress Note   Erin Castillo was contacted via in-person visit to discuss her chemotherapy regimen for inavolisib  + palbociclib  + fulvestrant  which they receive under the care of Dr. Amber Stalls.   Current treatment regimen and start date Inavolisib  (07/12/24) Palbociclib  (07/05/24) Fulvestrant  (06/25/24)  Interval History She continues on palbociclib  125 mg by mouth daily on days 1 to 21 of a 28-day cycle + inavolisib  9 mg daily, and fulvestrant  500 mg IM every 4 weeks. Therapy is planned to continue until disease progression or unacceptable toxicity.   The patient was last seen by Praveena Iruku on 08/05/24 and clinical pharmacy on 07/22/24.  Response to Therapy ***  Labs, vitals, treatment parameters, and manufacturer guidelines assessing toxicity were reviewed with Erin Castillo today. Based on these values, patient is in agreement to continue therapy at this time.  Allergies Allergies[1]  Vitals    08/05/2024    8:41 AM 07/22/2024    9:15 AM 07/08/2024    8:03 AM  Oncology Vitals  Height  163 cm   Weight 61.871 kg 62.188 kg   Weight (lbs) 136 lbs 6 oz 137 lbs 2 oz   BMI 23.41 kg/m2 23.53 kg/m2   Temp 97.7 F (36.5 C) 97.7 F (36.5 C) 98.5 F (36.9 C)  Pulse Rate 87 80 87  BP 106/61 101/64 106/77  Resp 16 18 17   SpO2 100 % 100 % 100 %  BSA (m2) 1.67 m2 1.68 m2     Laboratory Data    Latest Ref Rng & Units 08/05/2024    8:07 AM 07/22/2024    8:14 AM 07/08/2024    7:53 AM  CBC EXTENDED  WBC 4.0 - 10.5 K/uL 1.9  2.6  3.4   RBC 3.87 - 5.11 MIL/uL 3.97  4.07  3.91   Hemoglobin 12.0 - 15.0 g/dL 87.6  87.0  87.3   HCT 36.0 - 46.0 % 36.6  37.9  36.5   Platelets 150 - 400 K/uL 214  197  226   NEUT# 1.7 - 7.7 K/uL 0.8  1.1  2.0   Lymph# 0.7 - 4.0 K/uL 0.9  1.3  0.9        Latest Ref Rng & Units 08/05/2024    8:07 AM 07/22/2024    8:14 AM  07/08/2024    7:53 AM  CMP  Glucose 70 - 99 mg/dL 89  90  88   BUN 6 - 20 mg/dL 13  17  14    Creatinine 0.44 - 1.00 mg/dL 9.07  8.99  9.10   Sodium 135 - 145 mmol/L 143  143  141   Potassium 3.5 - 5.1 mmol/L 4.2  4.4  4.2   Chloride 98 - 111 mmol/L 106  105  106   CO2 22 - 32 mmol/L 27  31  28    Calcium 8.9 - 10.3 mg/dL 8.7  9.4  8.9   Total Protein 6.5 - 8.1 g/dL 7.3  7.2  7.0   Total Bilirubin 0.0 - 1.2 mg/dL 0.3  0.2  0.2   Alkaline Phos 38 - 126 U/L 70  65  51   AST 15 - 41 U/L 21  19  18    ALT 0 - 44 U/L 15  10  9      No results found for: MG Lab Results  Component Value Date   CA2729 112.4 (  H) 08/05/2024   CA2729 160.2 (H) 07/22/2024   CA2729 175.2 (H) 06/18/2024     Adverse Effects Assessment ***  Adherence Assessment Erin Castillo reports missing *** doses over the past *** weeks.   Reason for missed dose: N/A Patient was re-educated on importance of adherence.   Access Assessment Erin Castillo is currently receiving her inavolisib  and palbociclib  through [facility] concerns:  None  Medication Reconciliation The patient's medication list was reviewed today with the patient? Yes New medications or herbal supplements have recently been started? No  Any medications have been discontinued? No  The medication list was updated and reconciled based on the patient's most recent medication list in the electronic medical record (EMR) including herbal products and OTC medications.   Medications Current Outpatient Medications  Medication Sig Dispense Refill   Accu-Chek Softclix Lancets lancets as directed 100 each 0   b complex vitamins tablet Take 1 tablet by mouth daily.      Blood Glucose Monitoring Suppl (ACCU-CHEK GUIDE ME) w/Device KIT Check fasting glucose once every 3 days for first week, then weekly for 3 weeks, then every 2 weeks for 8 weeks, then once every 4 weeks thereafter. 1 kit 0   Cholecalciferol (VITAMIN D3) 25 MCG (1000 UT)  CAPS 1 capsule.     clobetasol cream (TEMOVATE) 0.05 % Apply 1 Application topically.     dexamethasone  0.5 MG/5ML elixir Swish 10 mL (1 mg) by mouth for 2 minutes, then spit. Repeat 4 times daily. Start on 1st day of inavolisib  (Itovebi ). 474 mL 4   Glucose Blood (BLOOD GLUCOSE TEST STRIPS) STRP Check fasting glucose once every 3 days for first week, then weekly for 3 weeks, then every 2 weeks for 8 weeks, then once every 4 weeks thereafter. 100 strip 0   ITOVEBI  9 MG TABS tablet TAKE 1 TABLET BY MOUTH 1 TIME A DAY 28 tablet 0   Lancet Device MISC Check fasting glucose once every 3 days for first week, then weekly for 3 weeks, then every 2 weeks for 8 weeks, then once every 4 weeks thereafter. May substitute to any manufacturer covered by patient's insurance. 1 each 0   melatonin 5 MG TABS Take 5 mg by mouth.     mesalamine (LIALDA) 1.2 G EC tablet Take 1,200 mg by mouth daily with breakfast.     Multiple Vitamin (MULITIVITAMIN WITH MINERALS) TABS Take 1 tablet by mouth daily.     nitroGLYCERIN  (NITROSTAT ) 0.4 MG SL tablet Place 1 tablet (0.4 mg total) under the tongue every 5 (five) minutes as needed for chest pain. 25 tablet 3   ondansetron  (ZOFRAN ) 8 MG tablet Take 1 tablet (8 mg total) by mouth every 8 (eight) hours as needed for nausea or vomiting. 30 tablet 2   palbociclib  (IBRANCE ) 100 MG tablet Take 1 tablet (100 mg total) by mouth daily. Take for 21 days on, 7 days off, repeat every 28 days. 21 tablet 3   SUMAtriptan (IMITREX) 50 MG tablet Take 50 mg by mouth every 2 (two) hours as needed for migraine. May repeat in 2 hours if headache persists or recurs.     traMADol  (ULTRAM ) 50 MG tablet Take 1 tablet (50 mg total) by mouth every 12 (twelve) hours as needed. 30 tablet 0   venlafaxine  XR (EFFEXOR -XR) 37.5 MG 24 hr capsule TAKE 2 CAPSULES (75 MG TOTAL) BY MOUTH DAILY. 180 capsule 2   No current facility-administered medications for this visit.   Drug-Drug  Interactions (DDIs) DDIs were  evaluated? Yes Significant DDIs? We have reviewed potential interaction with tramadol  and venlafaxine  in the past with patient The patient was instructed to speak with their health care provider and/or the oral chemotherapy pharmacist before starting any new drug, including prescription or over the counter, natural / herbal products, or vitamins.  Supportive Care Fever: reviewed the importance of having a thermometer and the Centers for Disease Control and Prevention (CDC) definition of fever which is 100.62F (38C) or higher. Patient should call 24/7 triage at [phone] if experiencing a fever or any other symptoms Decreased white blood cells (WBCs) and increased risk for infection Decreased hemoglobin, part of the red blood cells that carry iron and oxygen Decreased platelet count and increased risk for bleeding Changes in liver function Fatigue Nausea or vomiting Hair loss (alopecia) Mouth irritation or sores Pneumonitis / ILD Handling body fluids and wastes Storage and handling Missed doses Avoid grapefruit products Hyperglycemia Diarrhea Rash Ocular changes (dry eye / blurred vision)  Dosing Assessment Hepatic adjustments needed? No  Renal adjustments needed? No  Toxicity adjustments needed? No  The current dosing regimen is appropriate to continue at this time.  Follow-Up Plan Continue inavolisib  9 mg by mouth daily.  Continue palbociclib  125 mg by mouth daily for 21 days, followed by a 7-day rest period. Next cycle starts 08/02/24. Started 3rd week on Saturday 07/19/24. If labs continue to be low, consider obtaining labs right before start of next cycle to allow 7-day rest period. Continue fulvestrant  500 mg IM every 2 weeks for first 3 doses, then every 28 days thereafter. Last given 07/08/24, due today, then 08/20/24 Continue zoledronic  acid, 4 mg, IV, every 12 weeks. Last given 05/12/24, next due 08/04/24 Continue cetirizine 10 mg by mouth daily for cutaneous reaction  prevention Continue monitoring fasting blood glucose at home once every 3 days for the first week (days 1 to 7), then weekly for 3 weeks (days 8 to 28), then every 2 weeks for the next 8 weeks, then once every 4 weeks thereafter. Will check fasting blood glucose in clinic as well. Will start once starting inavolisib  can contact Dr. Loretha if fasting blood glucose is > 160 mg/dL Continue dexamethasone  mouthwash for mucositis prevention.  Monitor for side effects: *** Fasting labs, Dr. Loretha visit, and zoledronic  acid on 08/05/24 Fasting labs, pharmacy clinic visit, and fulvestrant  on 08/20/24 Erin Castillo can follow up with clinical pharmacy as deemed necessary by Dr. Amber Iruku going forward   Erin Castillo participated in the discussion, expressed understanding, and voiced agreement with the above plan. All questions were answered to her satisfaction. The patient was advised to contact the clinic at [phone] with any questions or concerns prior to her return visit.   I spent 30 minutes assessing and educating the patient.  Lennart Gladish A. Castillo, PharmD, BCOP, CPP  Erin Castillo, RPH-CPP, 08/19/2024  8:50 AM   **Disclaimer: This note was dictated with voice recognition software. Similar sounding words can inadvertently be transcribed and this note may contain transcription errors which may not have been corrected upon publication of note.**      [1] No Known Allergies  "

## 2024-08-20 ENCOUNTER — Inpatient Hospital Stay

## 2024-08-20 ENCOUNTER — Inpatient Hospital Stay: Admitting: Pharmacist

## 2024-08-28 ENCOUNTER — Inpatient Hospital Stay: Attending: Hematology and Oncology

## 2024-08-28 ENCOUNTER — Telehealth: Payer: Self-pay

## 2024-08-28 ENCOUNTER — Other Ambulatory Visit (HOSPITAL_COMMUNITY): Payer: Self-pay

## 2024-08-28 ENCOUNTER — Encounter: Payer: Self-pay | Admitting: Hematology and Oncology

## 2024-08-28 ENCOUNTER — Inpatient Hospital Stay

## 2024-08-28 ENCOUNTER — Inpatient Hospital Stay: Admitting: Pharmacist

## 2024-08-28 VITALS — BP 110/72 | HR 78 | Temp 98.0°F | Resp 18 | Ht 64.0 in | Wt 137.5 lb

## 2024-08-28 DIAGNOSIS — C50412 Malignant neoplasm of upper-outer quadrant of left female breast: Secondary | ICD-10-CM

## 2024-08-28 DIAGNOSIS — C50919 Malignant neoplasm of unspecified site of unspecified female breast: Secondary | ICD-10-CM

## 2024-08-28 LAB — CBC WITH DIFFERENTIAL (CANCER CENTER ONLY)
Abs Immature Granulocytes: 0.01 10*3/uL (ref 0.00–0.07)
Basophils Absolute: 0 10*3/uL (ref 0.0–0.1)
Basophils Relative: 1 %
Eosinophils Absolute: 0 10*3/uL (ref 0.0–0.5)
Eosinophils Relative: 2 %
HCT: 34.6 % — ABNORMAL LOW (ref 36.0–46.0)
Hemoglobin: 11.9 g/dL — ABNORMAL LOW (ref 12.0–15.0)
Immature Granulocytes: 0 %
Lymphocytes Relative: 55 %
Lymphs Abs: 1.2 10*3/uL (ref 0.7–4.0)
MCH: 31.9 pg (ref 26.0–34.0)
MCHC: 34.4 g/dL (ref 30.0–36.0)
MCV: 92.8 fL (ref 80.0–100.0)
Monocytes Absolute: 0.2 10*3/uL (ref 0.1–1.0)
Monocytes Relative: 7 %
Neutro Abs: 0.8 10*3/uL — ABNORMAL LOW (ref 1.7–7.7)
Neutrophils Relative %: 35 %
Platelet Count: 153 10*3/uL (ref 150–400)
RBC: 3.73 MIL/uL — ABNORMAL LOW (ref 3.87–5.11)
RDW: 15.4 % (ref 11.5–15.5)
WBC Count: 2.3 10*3/uL — ABNORMAL LOW (ref 4.0–10.5)
nRBC: 0 % (ref 0.0–0.2)

## 2024-08-28 LAB — CMP (CANCER CENTER ONLY)
ALT: 12 U/L (ref 0–44)
AST: 19 U/L (ref 15–41)
Albumin: 4.5 g/dL (ref 3.5–5.0)
Alkaline Phosphatase: 53 U/L (ref 38–126)
Anion gap: 9 (ref 5–15)
BUN: 13 mg/dL (ref 6–20)
CO2: 29 mmol/L (ref 22–32)
Calcium: 9 mg/dL (ref 8.9–10.3)
Chloride: 106 mmol/L (ref 98–111)
Creatinine: 0.92 mg/dL (ref 0.44–1.00)
GFR, Estimated: >60 mL/min
Glucose, Bld: 100 mg/dL — ABNORMAL HIGH (ref 70–99)
Potassium: 4 mmol/L (ref 3.5–5.1)
Sodium: 144 mmol/L (ref 135–145)
Total Bilirubin: 0.5 mg/dL (ref 0.0–1.2)
Total Protein: 7.4 g/dL (ref 6.5–8.1)

## 2024-08-28 MED ORDER — NYSTATIN 100000 UNIT/ML MT SUSP
5.0000 mL | Freq: Four times a day (QID) | OROMUCOSAL | 0 refills | Status: AC | PRN
  Filled 2024-08-28: qty 280, 14d supply, fill #0

## 2024-08-28 MED ORDER — FULVESTRANT 250 MG/5ML IM SOSY
500.0000 mg | PREFILLED_SYRINGE | Freq: Once | INTRAMUSCULAR | Status: AC
  Administered 2024-08-28: 500 mg via INTRAMUSCULAR
  Filled 2024-08-28: qty 10

## 2024-08-28 NOTE — Telephone Encounter (Signed)
 Oral Oncology Patient Advocate Encounter   Received notification that prior authorization renewal for Ibrance  is required.   PA submitted on 08/28/2024 [employer] Filled out form in online portal Status is pending      Charlott Hamilton,  CPhT-Adv  she/her/hers [facility]  [facility] Pharmacy Technician Patient Advocate Specialist III [facility] Phone: [phone]  Fax: [phone] [email or web address]

## 2024-08-29 LAB — CANCER ANTIGEN 15-3: CA 15-3: 77.6 U/mL — ABNORMAL HIGH (ref 0.0–25.0)

## 2024-08-29 LAB — CANCER ANTIGEN 27.29: CA 27.29: 78.5 U/mL — ABNORMAL HIGH (ref 0.0–38.6)

## 2024-08-29 NOTE — Telephone Encounter (Signed)
 Oral Oncology Patient Advocate Encounter  Prior Authorization for [employer]  has been approved.    PA# [id number] Effective dates: 08/29/2023 through 08/28/2025  Patient must fill prescription through [facility]  Patient has been notified via MyChart  Charlott Hamilton,  CPhT-Adv  she/her/hers [place]  [facility] Pharmacy Technician Patient Advocate Specialist III [facility] Phone: [phone]  Fax: [phone] [email or web address]

## 2024-09-04 ENCOUNTER — Inpatient Hospital Stay: Admitting: Pharmacist

## 2024-09-04 ENCOUNTER — Inpatient Hospital Stay

## 2024-09-16 ENCOUNTER — Inpatient Hospital Stay

## 2024-09-16 ENCOUNTER — Inpatient Hospital Stay: Admitting: Hematology and Oncology

## 2024-09-25 ENCOUNTER — Inpatient Hospital Stay: Admitting: Hematology and Oncology

## 2024-09-25 ENCOUNTER — Inpatient Hospital Stay

## 2024-10-09 ENCOUNTER — Inpatient Hospital Stay: Attending: Hematology and Oncology

## 2024-10-09 ENCOUNTER — Inpatient Hospital Stay

## 2024-10-09 ENCOUNTER — Inpatient Hospital Stay: Admitting: Hematology and Oncology

## 2024-11-06 ENCOUNTER — Inpatient Hospital Stay

## 2024-11-06 ENCOUNTER — Inpatient Hospital Stay: Attending: Hematology and Oncology

## 2024-11-06 ENCOUNTER — Inpatient Hospital Stay: Admitting: Pharmacist
